# Patient Record
Sex: Female | Born: 1935 | Race: Black or African American | Hispanic: No | Smoking: Never smoker
Health system: Southern US, Community
[De-identification: ages and names within clinical notes are randomized; demographics above are authoritative.]

## PROBLEM LIST (undated history)

## (undated) DIAGNOSIS — H409 Unspecified glaucoma: Secondary | ICD-10-CM

## (undated) DIAGNOSIS — I10 Essential (primary) hypertension: Secondary | ICD-10-CM

## (undated) DIAGNOSIS — F0390 Unspecified dementia without behavioral disturbance: Secondary | ICD-10-CM

## (undated) DIAGNOSIS — K219 Gastro-esophageal reflux disease without esophagitis: Secondary | ICD-10-CM

## (undated) DIAGNOSIS — R441 Visual hallucinations: Secondary | ICD-10-CM

## (undated) HISTORY — PX: CATARACT EXTRACTION: SUR2

## (undated) HISTORY — PX: REPLACEMENT TOTAL KNEE BILATERAL: SUR1225

## (undated) HISTORY — PX: GLAUCOMA REPAIR: SHX214

---

## 1998-02-15 ENCOUNTER — Other Ambulatory Visit: Admission: RE | Admit: 1998-02-15 | Discharge: 1998-02-15 | Payer: Self-pay | Admitting: Gynecology

## 1999-01-02 ENCOUNTER — Ambulatory Visit (HOSPITAL_COMMUNITY): Admission: RE | Admit: 1999-01-02 | Discharge: 1999-01-02 | Payer: Self-pay | Admitting: Nephrology

## 1999-01-02 ENCOUNTER — Encounter: Payer: Self-pay | Admitting: Nephrology

## 1999-03-09 ENCOUNTER — Emergency Department (HOSPITAL_COMMUNITY): Admission: EM | Admit: 1999-03-09 | Discharge: 1999-03-09 | Payer: Self-pay | Admitting: Emergency Medicine

## 1999-03-09 ENCOUNTER — Encounter: Payer: Self-pay | Admitting: Emergency Medicine

## 2000-06-02 ENCOUNTER — Other Ambulatory Visit: Admission: RE | Admit: 2000-06-02 | Discharge: 2000-06-02 | Payer: Self-pay | Admitting: Family Medicine

## 2000-06-16 ENCOUNTER — Ambulatory Visit (HOSPITAL_COMMUNITY): Admission: RE | Admit: 2000-06-16 | Discharge: 2000-06-16 | Payer: Self-pay | Admitting: Family Medicine

## 2000-06-16 ENCOUNTER — Encounter: Payer: Self-pay | Admitting: Family Medicine

## 2001-06-02 ENCOUNTER — Ambulatory Visit (HOSPITAL_COMMUNITY): Admission: RE | Admit: 2001-06-02 | Discharge: 2001-06-02 | Payer: Self-pay | Admitting: Cardiology

## 2001-06-02 ENCOUNTER — Encounter: Payer: Self-pay | Admitting: Cardiology

## 2001-06-23 ENCOUNTER — Encounter: Payer: Self-pay | Admitting: Family Medicine

## 2001-06-23 ENCOUNTER — Ambulatory Visit (HOSPITAL_COMMUNITY): Admission: RE | Admit: 2001-06-23 | Discharge: 2001-06-23 | Payer: Self-pay | Admitting: Family Medicine

## 2001-08-03 ENCOUNTER — Other Ambulatory Visit: Admission: RE | Admit: 2001-08-03 | Discharge: 2001-08-03 | Payer: Self-pay | Admitting: Gynecology

## 2001-12-17 ENCOUNTER — Emergency Department (HOSPITAL_COMMUNITY): Admission: EM | Admit: 2001-12-17 | Discharge: 2001-12-17 | Payer: Self-pay | Admitting: Emergency Medicine

## 2001-12-21 ENCOUNTER — Encounter: Admission: RE | Admit: 2001-12-21 | Discharge: 2001-12-21 | Payer: Self-pay | Admitting: General Practice

## 2001-12-21 ENCOUNTER — Encounter: Payer: Self-pay | Admitting: General Practice

## 2002-08-18 ENCOUNTER — Ambulatory Visit (HOSPITAL_COMMUNITY): Admission: RE | Admit: 2002-08-18 | Discharge: 2002-08-18 | Payer: Self-pay | Admitting: Family Medicine

## 2002-08-18 ENCOUNTER — Encounter: Payer: Self-pay | Admitting: Family Medicine

## 2003-11-29 ENCOUNTER — Encounter: Admission: RE | Admit: 2003-11-29 | Discharge: 2003-11-29 | Payer: Self-pay | Admitting: Sports Medicine

## 2003-12-05 ENCOUNTER — Encounter: Admission: RE | Admit: 2003-12-05 | Discharge: 2003-12-05 | Payer: Self-pay | Admitting: Internal Medicine

## 2004-01-11 ENCOUNTER — Encounter: Admission: RE | Admit: 2004-01-11 | Discharge: 2004-01-11 | Payer: Self-pay | Admitting: Sports Medicine

## 2004-04-24 ENCOUNTER — Encounter: Admission: RE | Admit: 2004-04-24 | Discharge: 2004-04-24 | Payer: Self-pay | Admitting: Neurosurgery

## 2004-07-30 ENCOUNTER — Encounter: Admission: RE | Admit: 2004-07-30 | Discharge: 2004-08-22 | Payer: Self-pay | Admitting: Neurosurgery

## 2004-11-29 ENCOUNTER — Encounter: Admission: RE | Admit: 2004-11-29 | Discharge: 2004-11-29 | Payer: Self-pay | Admitting: Internal Medicine

## 2005-05-13 ENCOUNTER — Ambulatory Visit: Admission: RE | Admit: 2005-05-13 | Discharge: 2005-05-13 | Payer: Self-pay | Admitting: Orthopedic Surgery

## 2005-05-26 ENCOUNTER — Inpatient Hospital Stay (HOSPITAL_COMMUNITY): Admission: RE | Admit: 2005-05-26 | Discharge: 2005-06-02 | Payer: Self-pay | Admitting: Orthopedic Surgery

## 2005-05-27 ENCOUNTER — Ambulatory Visit: Payer: Self-pay | Admitting: Physical Medicine & Rehabilitation

## 2005-06-02 ENCOUNTER — Ambulatory Visit: Payer: Self-pay | Admitting: Family Medicine

## 2005-06-06 ENCOUNTER — Ambulatory Visit: Payer: Self-pay | Admitting: Family Medicine

## 2005-06-11 ENCOUNTER — Ambulatory Visit: Payer: Self-pay | Admitting: Family Medicine

## 2005-06-18 ENCOUNTER — Ambulatory Visit: Payer: Self-pay | Admitting: Family Medicine

## 2005-06-25 ENCOUNTER — Ambulatory Visit: Payer: Self-pay | Admitting: Family Medicine

## 2005-07-21 ENCOUNTER — Encounter: Admission: RE | Admit: 2005-07-21 | Discharge: 2005-10-16 | Payer: Self-pay | Admitting: Orthopedic Surgery

## 2005-12-22 ENCOUNTER — Encounter: Admission: RE | Admit: 2005-12-22 | Discharge: 2005-12-22 | Payer: Self-pay | Admitting: Internal Medicine

## 2006-09-16 ENCOUNTER — Inpatient Hospital Stay (HOSPITAL_COMMUNITY): Admission: RE | Admit: 2006-09-16 | Discharge: 2006-09-22 | Payer: Self-pay | Admitting: Orthopedic Surgery

## 2006-09-22 ENCOUNTER — Ambulatory Visit: Payer: Self-pay | Admitting: Family Medicine

## 2006-09-24 ENCOUNTER — Ambulatory Visit: Payer: Self-pay | Admitting: Internal Medicine

## 2006-11-18 ENCOUNTER — Encounter: Admission: RE | Admit: 2006-11-18 | Discharge: 2007-02-02 | Payer: Self-pay | Admitting: Orthopedic Surgery

## 2007-01-19 ENCOUNTER — Encounter: Admission: RE | Admit: 2007-01-19 | Discharge: 2007-01-19 | Payer: Self-pay | Admitting: General Practice

## 2007-03-18 ENCOUNTER — Emergency Department (HOSPITAL_COMMUNITY): Admission: EM | Admit: 2007-03-18 | Discharge: 2007-03-18 | Payer: Self-pay | Admitting: Emergency Medicine

## 2008-01-20 ENCOUNTER — Encounter: Admission: RE | Admit: 2008-01-20 | Discharge: 2008-01-20 | Payer: Self-pay | Admitting: Internal Medicine

## 2008-11-02 IMAGING — CR DG CHEST 2V
2 series · 2 of 2 positions shown · non-contrast
Comparison: 05/29/05.

CLINICAL DATA: Preoperative respiratory exam for knee replacement.
 CHEST - 2 VIEW:

[view not recorded (1 of 2)]
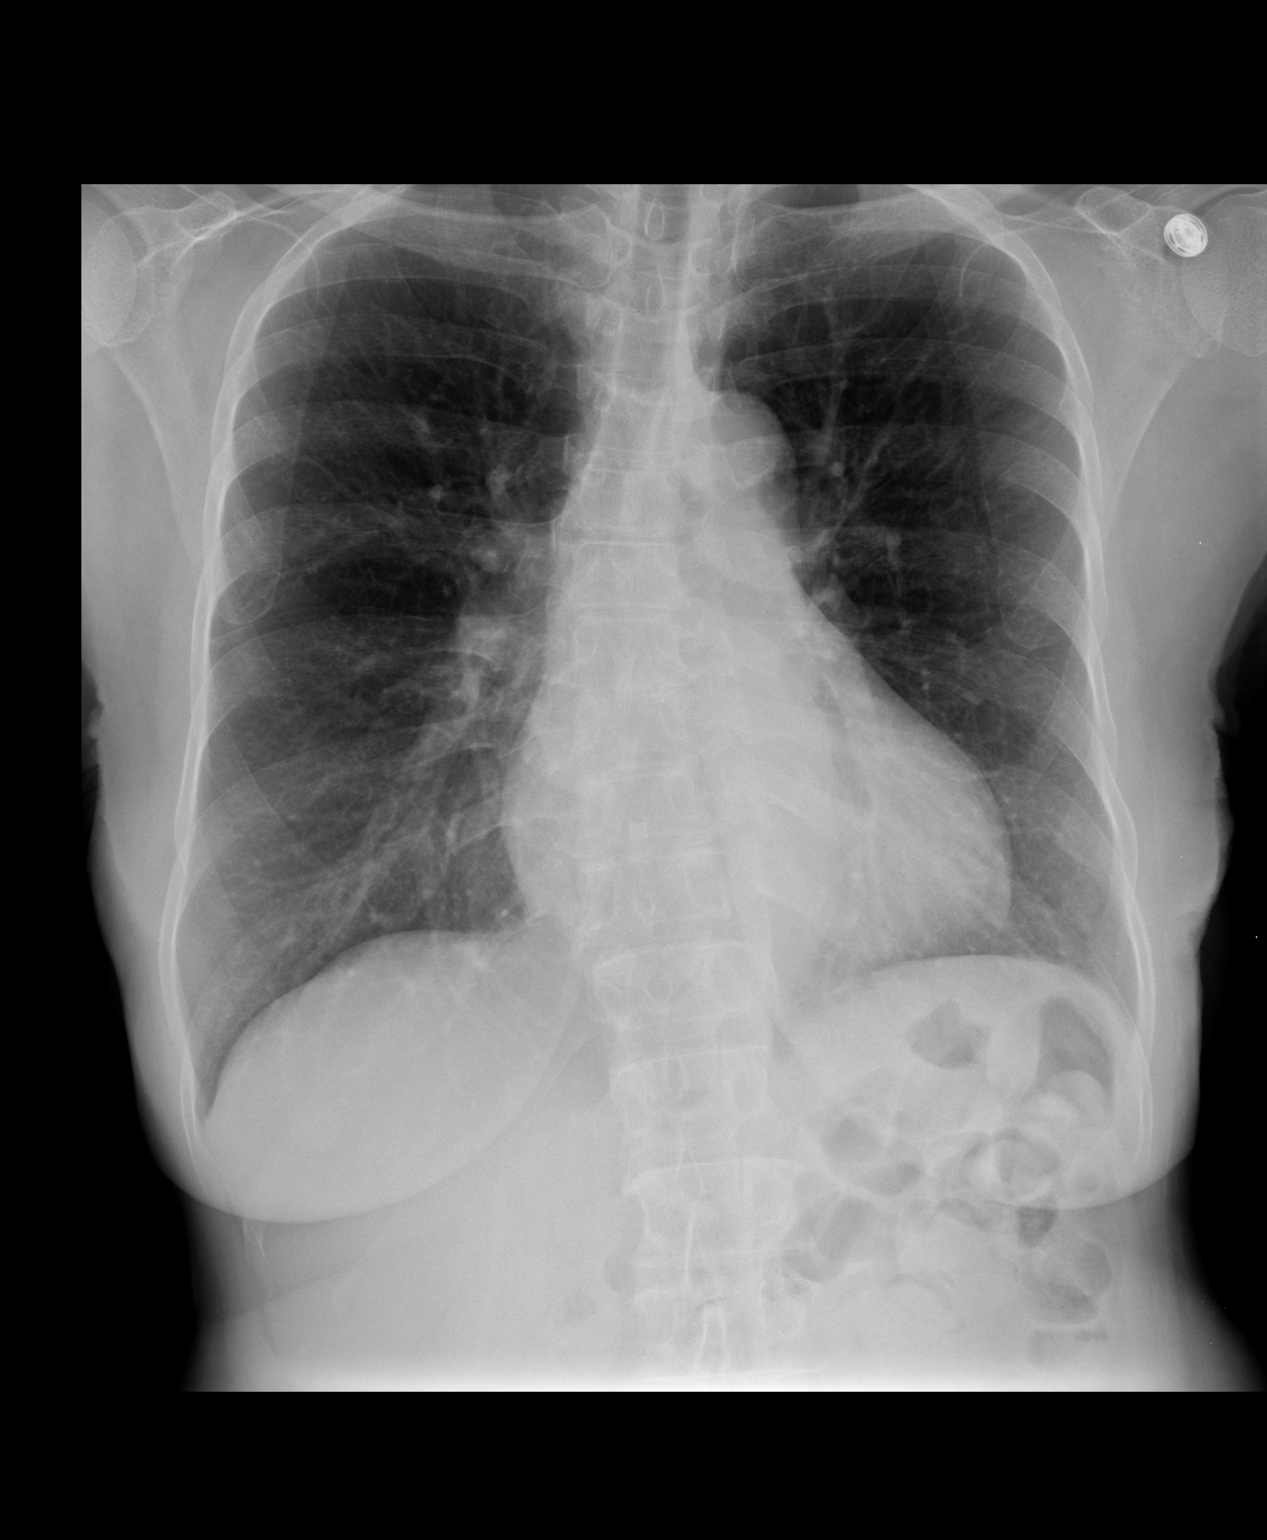

[view not recorded (2 of 2)]
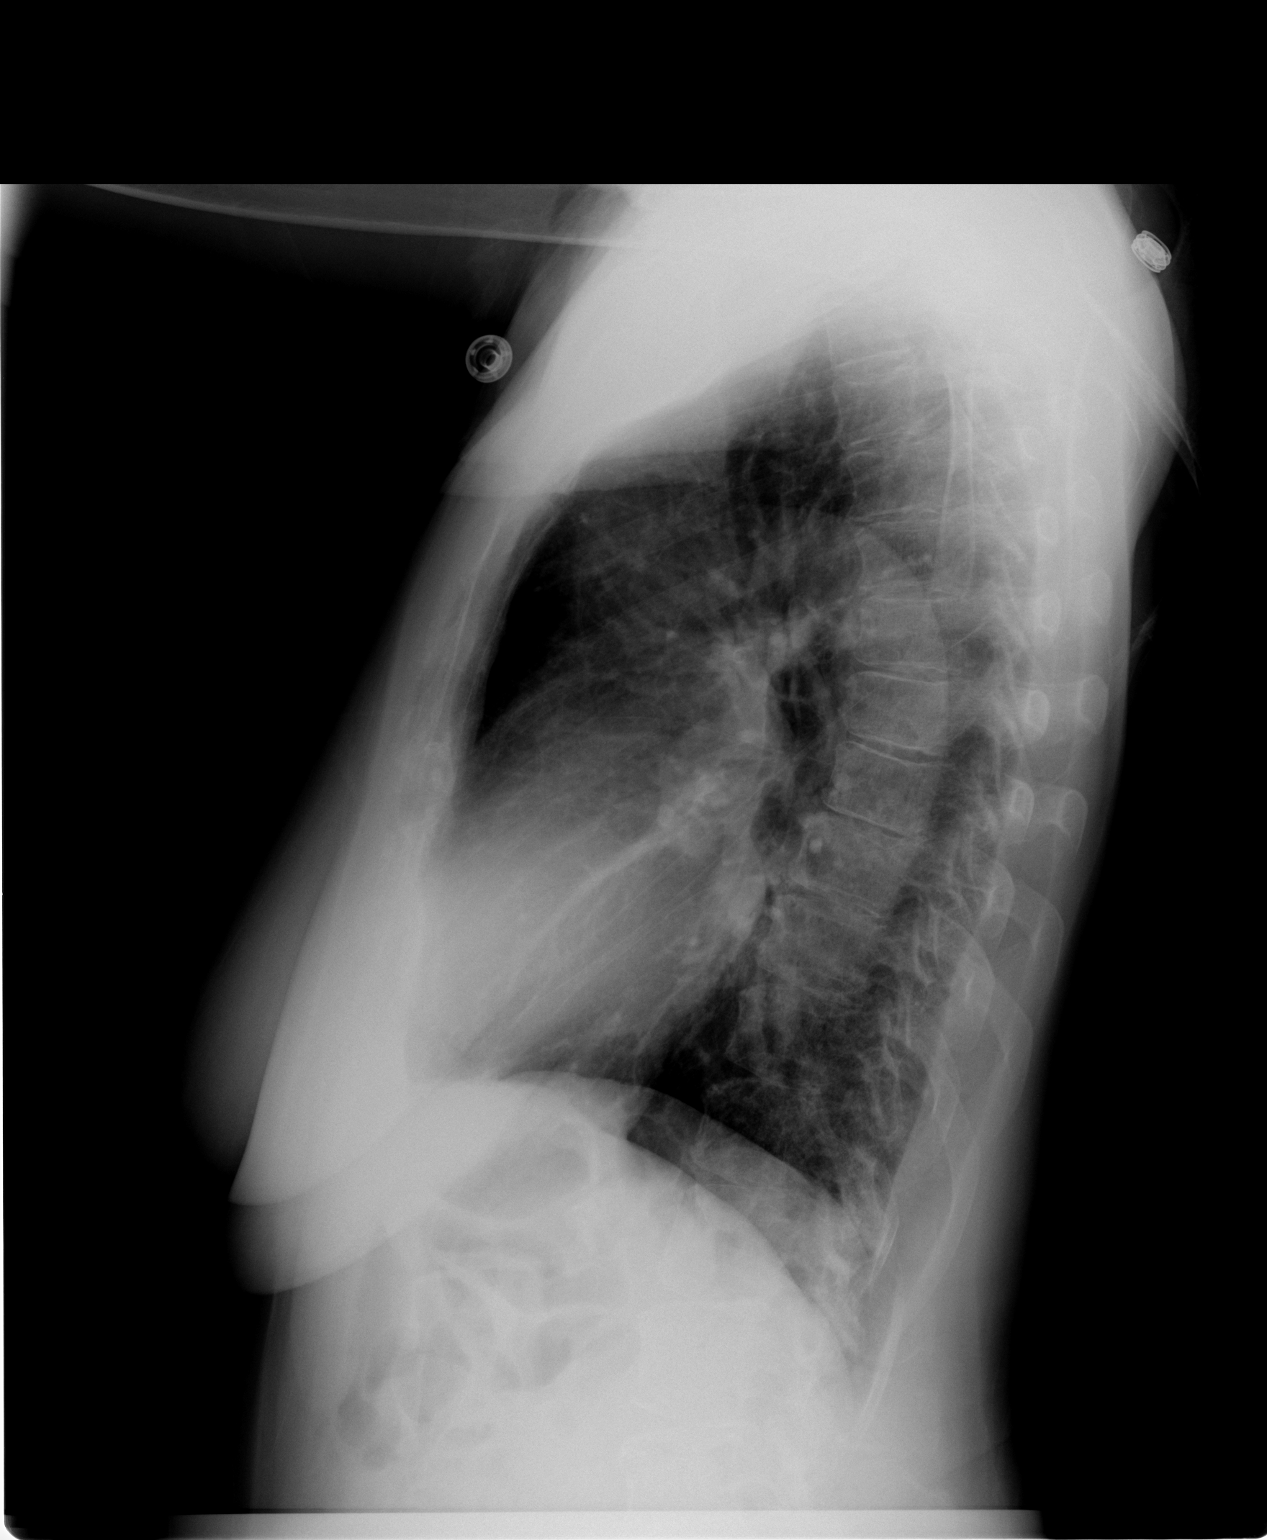

[2 of 2 positions shown; findings below may reference images not displayed]

FINDINGS: The heart is mildly enlarged. The mediastinum is unremarkable. There are a few pulmonary scars, but no evidence of active infiltrate, mass, effusion, or collapse. The spine shows scoliosis and mild degenerative change.
IMPRESSION: No change. No active disease.

## 2008-11-07 IMAGING — CR DG KNEE 1-2V*L*
2 series · 2 of 2 positions shown · non-contrast
Comparison: 01/02/99.

CLINICAL DATA: Status-post knee replacement for degenerative changes.
PORTABLE LEFT KNEE - 2 VIEW:

[view not recorded (1 of 2)]
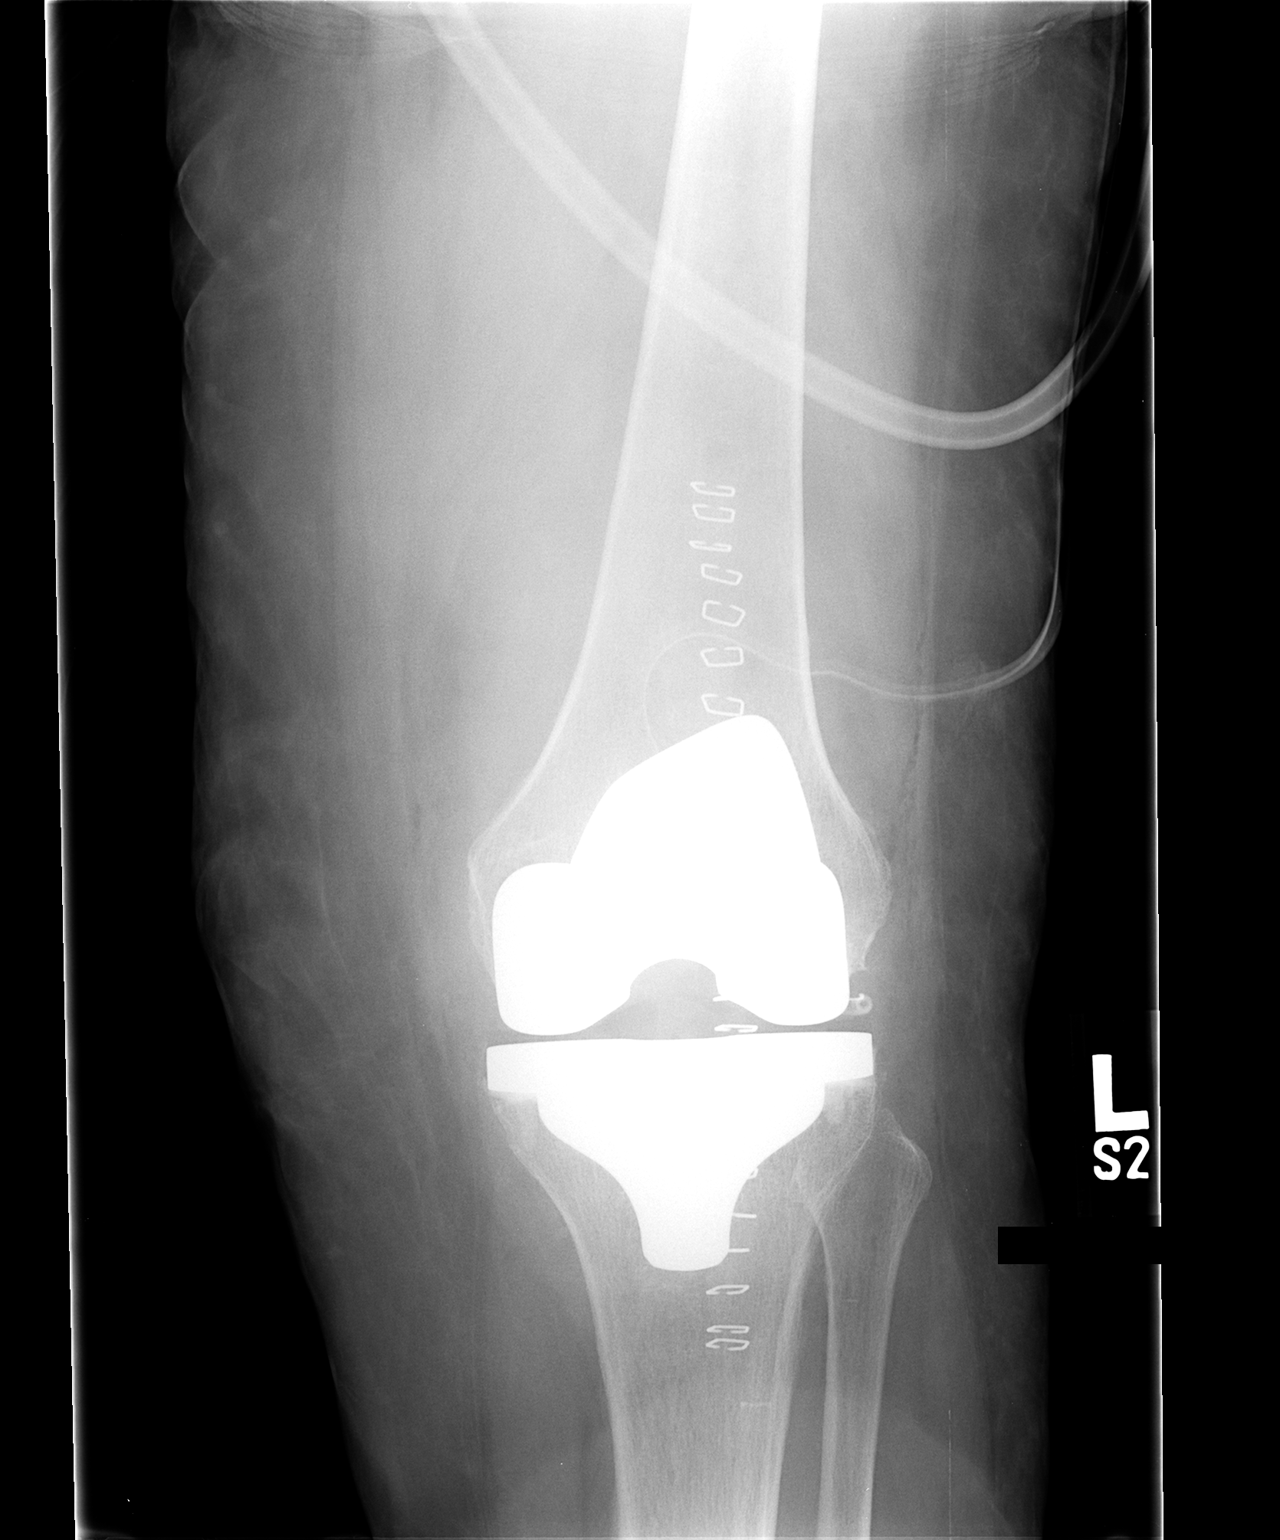

[view not recorded (2 of 2)]
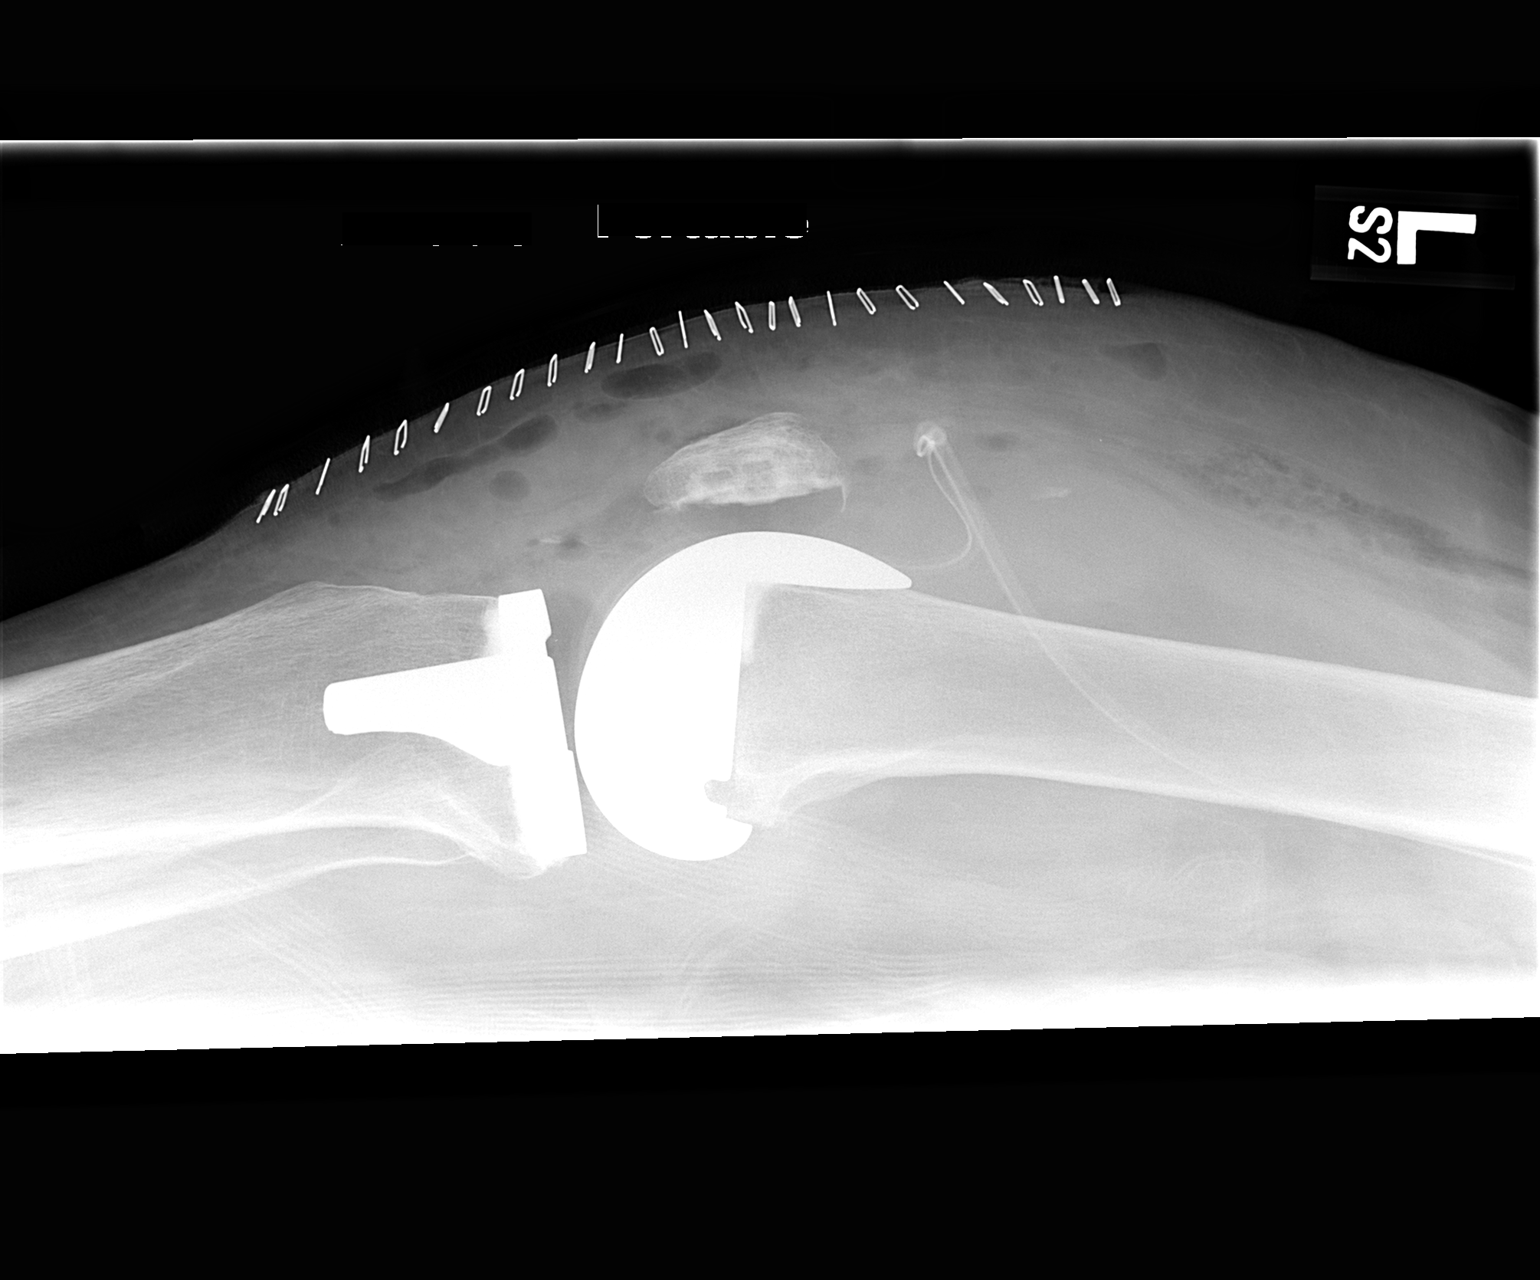

[2 of 2 positions shown; findings below may reference images not displayed]

FINDINGS: Total left knee replacement appears in satisfactory position.  There is a large suprapatellar joint effusion as well as a prominent amount of subcutaneous emphysema.  A surgical drain is in place.  all is into Dr. Rt.
IMPRESSION: 1.  Total left knee replacement appears in satisfactory position.
2.  Large suprapatellar joint effusion and prominent amount of subcutaneous emphysema.

## 2009-01-11 ENCOUNTER — Emergency Department (HOSPITAL_COMMUNITY): Admission: EM | Admit: 2009-01-11 | Discharge: 2009-01-11 | Payer: Self-pay | Admitting: Emergency Medicine

## 2009-03-01 ENCOUNTER — Ambulatory Visit (HOSPITAL_COMMUNITY): Admission: RE | Admit: 2009-03-01 | Discharge: 2009-03-01 | Payer: Self-pay | Admitting: Internal Medicine

## 2010-01-29 ENCOUNTER — Emergency Department (HOSPITAL_COMMUNITY)
Admission: EM | Admit: 2010-01-29 | Discharge: 2010-01-29 | Payer: Self-pay | Source: Home / Self Care | Admitting: Family Medicine

## 2010-01-30 ENCOUNTER — Encounter (INDEPENDENT_AMBULATORY_CARE_PROVIDER_SITE_OTHER): Payer: Self-pay | Admitting: Emergency Medicine

## 2010-01-30 ENCOUNTER — Ambulatory Visit (HOSPITAL_COMMUNITY)
Admission: RE | Admit: 2010-01-30 | Discharge: 2010-01-30 | Payer: Self-pay | Source: Home / Self Care | Attending: Emergency Medicine | Admitting: Emergency Medicine

## 2010-02-12 ENCOUNTER — Inpatient Hospital Stay (HOSPITAL_COMMUNITY)
Admission: EM | Admit: 2010-02-12 | Discharge: 2010-02-19 | Payer: Self-pay | Source: Home / Self Care | Attending: Pulmonary Disease | Admitting: Pulmonary Disease

## 2010-02-14 ENCOUNTER — Encounter (INDEPENDENT_AMBULATORY_CARE_PROVIDER_SITE_OTHER): Payer: Self-pay | Admitting: Pulmonary Disease

## 2010-02-17 ENCOUNTER — Encounter: Payer: Self-pay | Admitting: Internal Medicine

## 2010-02-17 LAB — CONVERTED CEMR LAB
BUN: 14 mg/dL
CO2: 29 meq/L
Chloride: 103 meq/L
Creatinine, Ser: 0.72 mg/dL
HCT: 33.9 %
Hemoglobin: 11.3 g/dL
MCV: 33.9 fL
Potassium: 3.6 meq/L
Sodium: 138 meq/L
WBC: 10.4 10*3/uL

## 2010-02-18 LAB — GLUCOSE, CAPILLARY
Glucose-Capillary: 103 mg/dL — ABNORMAL HIGH (ref 70–99)
Glucose-Capillary: 107 mg/dL — ABNORMAL HIGH (ref 70–99)
Glucose-Capillary: 118 mg/dL — ABNORMAL HIGH (ref 70–99)
Glucose-Capillary: 129 mg/dL — ABNORMAL HIGH (ref 70–99)
Glucose-Capillary: 134 mg/dL — ABNORMAL HIGH (ref 70–99)
Glucose-Capillary: 136 mg/dL — ABNORMAL HIGH (ref 70–99)
Glucose-Capillary: 136 mg/dL — ABNORMAL HIGH (ref 70–99)
Glucose-Capillary: 148 mg/dL — ABNORMAL HIGH (ref 70–99)
Glucose-Capillary: 163 mg/dL — ABNORMAL HIGH (ref 70–99)
Glucose-Capillary: 164 mg/dL — ABNORMAL HIGH (ref 70–99)
Glucose-Capillary: 175 mg/dL — ABNORMAL HIGH (ref 70–99)
Glucose-Capillary: 106 mg/dL — ABNORMAL HIGH (ref 70–99)
Glucose-Capillary: 109 mg/dL — ABNORMAL HIGH (ref 70–99)
Glucose-Capillary: 118 mg/dL — ABNORMAL HIGH (ref 70–99)
Glucose-Capillary: 140 mg/dL — ABNORMAL HIGH (ref 70–99)
Glucose-Capillary: 152 mg/dL — ABNORMAL HIGH (ref 70–99)
Glucose-Capillary: 154 mg/dL — ABNORMAL HIGH (ref 70–99)
Glucose-Capillary: 158 mg/dL — ABNORMAL HIGH (ref 70–99)
Glucose-Capillary: 165 mg/dL — ABNORMAL HIGH (ref 70–99)
Glucose-Capillary: 166 mg/dL — ABNORMAL HIGH (ref 70–99)
Glucose-Capillary: 171 mg/dL — ABNORMAL HIGH (ref 70–99)
Glucose-Capillary: 174 mg/dL — ABNORMAL HIGH (ref 70–99)
Glucose-Capillary: 179 mg/dL — ABNORMAL HIGH (ref 70–99)
Glucose-Capillary: 99 mg/dL (ref 70–99)
Glucose-Capillary: 115 mg/dL — ABNORMAL HIGH (ref 70–99)
Glucose-Capillary: 116 mg/dL — ABNORMAL HIGH (ref 70–99)
Glucose-Capillary: 120 mg/dL — ABNORMAL HIGH (ref 70–99)
Glucose-Capillary: 139 mg/dL — ABNORMAL HIGH (ref 70–99)
Glucose-Capillary: 87 mg/dL (ref 70–99)

## 2010-02-18 LAB — BASIC METABOLIC PANEL
BUN: 14 mg/dL (ref 6–23)
BUN: 9 mg/dL (ref 6–23)
BUN: 11 mg/dL (ref 6–23)
BUN: 11 mg/dL (ref 6–23)
BUN: 14 mg/dL (ref 6–23)
CO2: 24 mEq/L (ref 19–32)
CO2: 24 mEq/L (ref 19–32)
CO2: 26 mEq/L (ref 19–32)
CO2: 30 mEq/L (ref 19–32)
CO2: 29 mEq/L (ref 19–32)
Calcium: 9.1 mg/dL (ref 8.4–10.5)
Calcium: 8.6 mg/dL (ref 8.4–10.5)
Calcium: 8.8 mg/dL (ref 8.4–10.5)
Calcium: 9 mg/dL (ref 8.4–10.5)
Calcium: 8.6 mg/dL (ref 8.4–10.5)
Chloride: 95 mEq/L — ABNORMAL LOW (ref 96–112)
Chloride: 102 mEq/L (ref 96–112)
Chloride: 107 mEq/L (ref 96–112)
Chloride: 103 mEq/L (ref 96–112)
Chloride: 103 mEq/L (ref 96–112)
Creatinine, Ser: 0.72 mg/dL (ref 0.4–1.2)
Creatinine, Ser: 0.68 mg/dL (ref 0.4–1.2)
Creatinine, Ser: 0.6 mg/dL (ref 0.4–1.2)
Creatinine, Ser: 0.59 mg/dL (ref 0.4–1.2)
Creatinine, Ser: 0.72 mg/dL (ref 0.4–1.2)
GFR calc Af Amer: >60 mL/min (ref >60)
GFR calc Af Amer: >60 mL/min (ref >60)
GFR calc Af Amer: >60 mL/min (ref >60)
GFR calc Af Amer: >60 mL/min (ref >60)
GFR calc Af Amer: >60 mL/min (ref >60)
GFR calc non Af Amer: >60 mL/min (ref >60)
GFR calc non Af Amer: >60 mL/min (ref >60)
GFR calc non Af Amer: >60 mL/min (ref >60)
GFR calc non Af Amer: >60 mL/min (ref >60)
GFR calc non Af Amer: >60 mL/min (ref >60)
Glucose, Bld: 120 mg/dL — ABNORMAL HIGH (ref 70–99)
Glucose, Bld: 130 mg/dL — ABNORMAL HIGH (ref 70–99)
Glucose, Bld: 165 mg/dL — ABNORMAL HIGH (ref 70–99)
Glucose, Bld: 116 mg/dL — ABNORMAL HIGH (ref 70–99)
Glucose, Bld: 82 mg/dL (ref 70–99)
Potassium: 3.2 mEq/L — ABNORMAL LOW (ref 3.5–5.1)
Potassium: 4.1 mEq/L (ref 3.5–5.1)
Potassium: 4.4 mEq/L (ref 3.5–5.1)
Potassium: 4.1 mEq/L (ref 3.5–5.1)
Potassium: 3.6 mEq/L (ref 3.5–5.1)
Sodium: 133 mEq/L — ABNORMAL LOW (ref 135–145)
Sodium: 135 mEq/L (ref 135–145)
Sodium: 141 mEq/L (ref 135–145)
Sodium: 140 mEq/L (ref 135–145)
Sodium: 138 mEq/L (ref 135–145)

## 2010-02-18 LAB — DIFFERENTIAL
Basophils Absolute: 0 10*3/uL (ref 0.0–0.1)
Basophils Relative: 0 % (ref 0–1)
Eosinophils Absolute: 0.1 10*3/uL (ref 0.0–0.7)
Eosinophils Relative: 1 % (ref 0–5)
Lymphocytes Relative: 66 % — ABNORMAL HIGH (ref 12–46)
Lymphs Abs: 6.6 10*3/uL — ABNORMAL HIGH (ref 0.7–4.0)
Monocytes Absolute: 0.9 10*3/uL (ref 0.1–1.0)
Monocytes Relative: 9 % (ref 3–12)
Neutro Abs: 2.4 10*3/uL (ref 1.7–7.7)
Neutrophils Relative %: 24 % — ABNORMAL LOW (ref 43–77)

## 2010-02-18 LAB — BENZODIAZEPINE, QUANTITATIVE, URINE
Alprazolam (GC/LC/MS), ur confirm: NEGATIVE NG/ML
Flurazepam GC/MS Conf: NEGATIVE NG/ML
Lorazepam UR QT: NEGATIVE NG/ML
Nordiazepam GC/MS Conf: NEGATIVE NG/ML
Oxazepam GC/MS Conf: NEGATIVE NG/ML
Temazepam GC/MS Conf: NEGATIVE NG/ML

## 2010-02-18 LAB — DRUGS OF ABUSE SCREEN W/O ALC, ROUTINE URINE
Amphetamine Screen, Ur: NEGATIVE
Barbiturate Quant, Ur: NEGATIVE
Benzodiazepines.: POSITIVE — AB
Cocaine Metabolites: NEGATIVE
Creatinine,U: 30.9 mg/dL
Marijuana Metabolite: NEGATIVE
Methadone: NEGATIVE
Opiate Screen, Urine: NEGATIVE
Phencyclidine (PCP): NEGATIVE
Propoxyphene: NEGATIVE

## 2010-02-18 LAB — CBC
HCT: 33.2 % — ABNORMAL LOW (ref 36.0–46.0)
HCT: 34 % — ABNORMAL LOW (ref 36.0–46.0)
HCT: 34.6 % — ABNORMAL LOW (ref 36.0–46.0)
HCT: 34.7 % — ABNORMAL LOW (ref 36.0–46.0)
HCT: 33.9 % — ABNORMAL LOW (ref 36.0–46.0)
Hemoglobin: 11 g/dL — ABNORMAL LOW (ref 12.0–15.0)
Hemoglobin: 11.6 g/dL — ABNORMAL LOW (ref 12.0–15.0)
Hemoglobin: 11.6 g/dL — ABNORMAL LOW (ref 12.0–15.0)
Hemoglobin: 11.6 g/dL — ABNORMAL LOW (ref 12.0–15.0)
Hemoglobin: 11.3 g/dL — ABNORMAL LOW (ref 12.0–15.0)
MCH: 23.6 pg — ABNORMAL LOW (ref 26.0–34.0)
MCH: 24 pg — ABNORMAL LOW (ref 26.0–34.0)
MCH: 23.8 pg — ABNORMAL LOW (ref 26.0–34.0)
MCH: 24 pg — ABNORMAL LOW (ref 26.0–34.0)
MCH: 23.9 pg — ABNORMAL LOW (ref 26.0–34.0)
MCHC: 33.1 g/dL (ref 30.0–36.0)
MCHC: 34.1 g/dL (ref 30.0–36.0)
MCHC: 33.5 g/dL (ref 30.0–36.0)
MCHC: 33.4 g/dL (ref 30.0–36.0)
MCHC: 33.3 g/dL (ref 30.0–36.0)
MCV: 71.2 fL — ABNORMAL LOW (ref 78.0–100.0)
MCV: 70.4 fL — ABNORMAL LOW (ref 78.0–100.0)
MCV: 71 fL — ABNORMAL LOW (ref 78.0–100.0)
MCV: 71.7 fL — ABNORMAL LOW (ref 78.0–100.0)
MCV: 71.7 fL — ABNORMAL LOW (ref 78.0–100.0)
Platelets: 212 10*3/uL (ref 150–400)
Platelets: 217 10*3/uL (ref 150–400)
Platelets: 241 10*3/uL (ref 150–400)
Platelets: 233 10*3/uL (ref 150–400)
Platelets: 233 10*3/uL (ref 150–400)
RBC: 4.66 MIL/uL (ref 3.87–5.11)
RBC: 4.83 MIL/uL (ref 3.87–5.11)
RBC: 4.87 MIL/uL (ref 3.87–5.11)
RBC: 4.84 MIL/uL (ref 3.87–5.11)
RBC: 4.73 MIL/uL (ref 3.87–5.11)
RDW: 14.9 % (ref 11.5–15.5)
RDW: 15 % (ref 11.5–15.5)
RDW: 15.3 % (ref 11.5–15.5)
RDW: 15.4 % (ref 11.5–15.5)
RDW: 15.1 % (ref 11.5–15.5)
WBC: 10 10*3/uL (ref 4.0–10.5)
WBC: 11.2 10*3/uL — ABNORMAL HIGH (ref 4.0–10.5)
WBC: 12.2 10*3/uL — ABNORMAL HIGH (ref 4.0–10.5)
WBC: 9.3 10*3/uL (ref 4.0–10.5)
WBC: 10.4 10*3/uL (ref 4.0–10.5)

## 2010-02-18 LAB — POCT I-STAT 3, ART BLOOD GAS (G3+)
Acid-Base Excess: 4 mmol/L — ABNORMAL HIGH (ref 0.0–2.0)
Bicarbonate: 26.2 mEq/L — ABNORMAL HIGH (ref 20.0–24.0)
O2 Saturation: 100 %
TCO2: 27 mmol/L (ref 0–100)
pCO2 arterial: 30.7 mmHg — ABNORMAL LOW (ref 35.0–45.0)
pH, Arterial: 7.538 — ABNORMAL HIGH (ref 7.350–7.400)
pO2, Arterial: 144 mmHg — ABNORMAL HIGH (ref 80.0–100.0)

## 2010-02-18 LAB — POCT I-STAT, CHEM 8
BUN: 16 mg/dL (ref 6–23)
Calcium, Ion: 1.08 mmol/L — ABNORMAL LOW (ref 1.12–1.32)
Chloride: 100 mEq/L (ref 96–112)
Creatinine, Ser: 0.9 mg/dL (ref 0.4–1.2)
Glucose, Bld: 137 mg/dL — ABNORMAL HIGH (ref 70–99)
HCT: 38 % (ref 36.0–46.0)
Hemoglobin: 12.9 g/dL (ref 12.0–15.0)
Potassium: 3.3 mEq/L — ABNORMAL LOW (ref 3.5–5.1)
Sodium: 135 mEq/L (ref 135–145)
TCO2: 26 mmol/L (ref 0–100)

## 2010-02-18 LAB — BLOOD GAS, ARTERIAL
Acid-Base Excess: 0.6 mmol/L (ref 0.0–2.0)
Acid-Base Excess: 1.3 mmol/L (ref 0.0–2.0)
Bicarbonate: 25.1 mEq/L — ABNORMAL HIGH (ref 20.0–24.0)
Bicarbonate: 25.2 mEq/L — ABNORMAL HIGH (ref 20.0–24.0)
Drawn by: 27407
FIO2: 0.4 %
FIO2: 0.4 %
MECHVT: 450 mL
MECHVT: 450 mL
O2 Saturation: 99.2 %
O2 Saturation: 99.3 %
PEEP: 5 cmH2O
PEEP: 5 cmH2O
Patient temperature: 98.9
Patient temperature: 98.6
RATE: 10 resp/min
RATE: 10 resp/min
TCO2: 26.4 mmol/L (ref 0–100)
TCO2: 26.4 mmol/L (ref 0–100)
pCO2 arterial: 43.1 mmHg (ref 35.0–45.0)
pCO2 arterial: 38.9 mmHg (ref 35.0–45.0)
pH, Arterial: 7.384 (ref 7.350–7.400)
pH, Arterial: 7.427 — ABNORMAL HIGH (ref 7.350–7.400)
pO2, Arterial: 164 mmHg — ABNORMAL HIGH (ref 80.0–100.0)
pO2, Arterial: 157 mmHg — ABNORMAL HIGH (ref 80.0–100.0)

## 2010-02-18 LAB — CARDIAC PANEL(CRET KIN+CKTOT+MB+TROPI)
CK, MB: 1.2 ng/mL (ref 0.3–4.0)
CK, MB: 1 ng/mL (ref 0.3–4.0)
Relative Index: INVALID (ref 0.0–2.5)
Relative Index: INVALID (ref 0.0–2.5)
Total CK: 97 U/L (ref 7–177)
Total CK: 79 U/L (ref 7–177)
Troponin I: 0.02 ng/mL (ref 0.00–0.06)
Troponin I: 0.01 ng/mL (ref 0.00–0.06)

## 2010-02-18 LAB — D-DIMER, QUANTITATIVE (NOT AT ARMC): D-Dimer, Quant: 3.09 ug/mL-FEU — ABNORMAL HIGH (ref 0.00–0.48)

## 2010-02-18 LAB — APTT: aPTT: 27 seconds (ref 24–37)

## 2010-02-18 LAB — MRSA PCR SCREENING: MRSA by PCR: NEGATIVE

## 2010-02-18 LAB — PHOSPHORUS
Phosphorus: 2.2 mg/dL — ABNORMAL LOW (ref 2.3–4.6)
Phosphorus: 3.1 mg/dL (ref 2.3–4.6)
Phosphorus: 1.8 mg/dL — ABNORMAL LOW (ref 2.3–4.6)
Phosphorus: 2.8 mg/dL (ref 2.3–4.6)

## 2010-02-18 LAB — CK TOTAL AND CKMB (NOT AT ARMC)
CK, MB: 1.4 ng/mL (ref 0.3–4.0)
Relative Index: 1.2 (ref 0.0–2.5)
Total CK: 120 U/L (ref 7–177)

## 2010-02-18 LAB — TROPONIN I: Troponin I: 0.02 ng/mL (ref 0.00–0.06)

## 2010-02-18 LAB — MAGNESIUM
Magnesium: 2.3 mg/dL (ref 1.5–2.5)
Magnesium: 2.5 mg/dL (ref 1.5–2.5)
Magnesium: 2.7 mg/dL — ABNORMAL HIGH (ref 1.5–2.5)
Magnesium: 2.6 mg/dL — ABNORMAL HIGH (ref 1.5–2.5)

## 2010-02-18 LAB — PATHOLOGIST SMEAR REVIEW

## 2010-02-18 LAB — PROTIME-INR
INR: 0.89 (ref 0.00–1.49)
Prothrombin Time: 12.2 seconds (ref 11.6–15.2)

## 2010-02-18 LAB — BRAIN NATRIURETIC PEPTIDE: Pro B Natriuretic peptide (BNP): 81 pg/mL (ref 0.0–100.0)

## 2010-02-20 LAB — GLUCOSE, CAPILLARY
Glucose-Capillary: 89 mg/dL (ref 70–99)
Glucose-Capillary: 112 mg/dL — ABNORMAL HIGH (ref 70–99)
Glucose-Capillary: 174 mg/dL — ABNORMAL HIGH (ref 70–99)
Glucose-Capillary: 104 mg/dL — ABNORMAL HIGH (ref 70–99)
Glucose-Capillary: 83 mg/dL (ref 70–99)
Glucose-Capillary: 134 mg/dL — ABNORMAL HIGH (ref 70–99)

## 2010-02-20 LAB — CARDIAC PANEL(CRET KIN+CKTOT+MB+TROPI)
CK, MB: 0.5 ng/mL (ref 0.3–4.0)
CK, MB: 0.5 ng/mL (ref 0.3–4.0)
CK, MB: 1 ng/mL (ref 0.3–4.0)
Relative Index: INVALID (ref 0.0–2.5)
Relative Index: INVALID (ref 0.0–2.5)
Relative Index: INVALID (ref 0.0–2.5)
Total CK: 32 U/L (ref 7–177)
Total CK: 27 U/L (ref 7–177)
Total CK: 31 U/L (ref 7–177)
Troponin I: 0.01 ng/mL (ref 0.00–0.06)
Troponin I: <0.01 ng/mL (ref 0.00–0.06)
Troponin I: 0.02 ng/mL (ref 0.00–0.06)

## 2010-02-24 ENCOUNTER — Encounter: Payer: Self-pay | Admitting: Neurosurgery

## 2010-02-24 ENCOUNTER — Encounter: Payer: Self-pay | Admitting: Internal Medicine

## 2010-03-05 NOTE — Discharge Summary (Signed)
NAME:  Theresa Bauer, Theresa Bauer                   ACCOUNT NO.:  [id number]  MEDICAL RECORD NO.:  [id number]          PATIENT TYPE:  INP  LOCATION:  5523                         FACILITY:  [facility]  PHYSICIAN:  Leslye Peer, MD    DATE OF BIRTH:  06-13-1929  DATE OF ADMISSION:  02/12/2010 DATE OF DISCHARGE:  02/19/2010                              DISCHARGE SUMMARY   DISCHARGE DIAGNOSES: 1. Acute respiratory failure due to upper airway obstruction in the     setting of angioedema.  This is believed to be secondary to ACE     inhibitor versus new medication by the name of Vimovo, which was     just recently started (this is now resolved). 2. Musculoskeletal chest pain. 3. Hypertension. 4. Dysphagia secondary to acute respiratory failure (resolved). 5. Chronic venous stasis disease with associated pain. 6. Debilitation following hospitalization.  CONSULTANTS:  None.  PROCEDURES:  Endotracheal tube.  This was done by Anesthesia under glide scope.  She was intubated on February 13, 2010, and extubated on February 15, 2010.  LABORATORY DATA:  Cardiac enzymes all negative.  These were done in serial fashion on February 18, 2010, through the February 16, 2010.  Dated February 17, 2010; sodium 138, potassium 3.6, chloride 103, CO2 of 29, glucose 82, BUN 14, and creatinine 0.72.  White blood cell count 10.4, hemoglobin 11.3, hematocrit 33.9, and platelet count 233.  Phosphorus 8.2.  IMAGING DATA:  Last chest x-ray obtained on February 16, 2010, demonstrates improved aeration with some residual atelectasis.  BRIEF HISTORY:  This is a pleasant 77 year old African American female with a known history of hypertension, gastroesophageal reflux disease, L4 through L5 spinal stenosis, and chronic venous stasis disease.  She presents to emergency room with tongue swelling and dyspnea on February 13, 2009, after recently starting a new medication by the name of Vimovo.  Of note, she also was on ACE inhibitor.   She was treated with epinephrine, Benadryl, and Solu-Medrol as well as Zantac.  In the emergency room, she had no improvement in her wheezing nor dyspnea. Therefore, she was intubated given concern for airway protection.  DISCHARGE DIAGNOSES: 1. Upper airway obstruction secondary to angioedema with acute     respiratory failure.  Theresa Bauer was intubated as previously     mentioned by Anesthesia after failure to respond to rescue therapy     in the emergency room.  Therapeutic interventions included     discontinuation of ACE inhibitor, discontinuation of the Vimovo,     supportive care with mechanical ventilation, scheduled Solu-Medrol,     scheduled H2 blockade, and close monitoring.  She continued to     improve over the course of her hospitalization.  Airway cuff leak     was checked and she was successfully extubated on February 15, 2010.     Upon time of dictation, Theresa Bauer no longer has any evidence of     upper airway obstruction.  Her breath sounds are clear.  There is     no upper airway wheeze.  Her dysphagia is resolved.  She has no  chest discomfort.  She will be discharged to home with instructions     to not resume Vimovo, also she has been instructed to discontinue     ACE inhibitors.  She will complete a 5-day taper of prednisone and     continue H2 blockade in the form of Zantac.  This will also serve     to assist with her underlying gastroesophageal reflux disease. 2. Noncardiac related chest pain.  This is secondary to cough.  All     cardiac enzymes and EKG have been negative.  Probably to some     extent, exacerbated by a gastroesophageal reflux disease.  Again,     this will be treated with Zantac as noted above. 3. Hypertension.  This was a underlying problem.  For this, she will     be treated with calcium channel blockade, Lopressor has been     ordered, and finally we will add low dose hydrochlorothiazide. 4. Chronic venous stasis disease with sclerosed  varicose veins in the     right.  These have been causing, Theresa Bauer to have some degree of     discomfort.  This has been a chronic issue and is exacerbated over     the course of the day with increased swelling.  Plan for this will     be to resume compression stockings this is a therapy she used in     the past, as well as to continue a low-dose diuretic regimen. 5. Dysphagia.  This is improved following airway upper airway     obstruction and angioedema.  Plan for this is to resume regular     diet. 6. Debilitation following critical illness.  For this, she will be     discharged to a skilled nursing facility, she will undergo physical     therapy and occupational therapy there with ultimate overall goal     to return to home.  DISCHARGE MEDICATIONS:  List is as follows; 1. Prednisone 10 mg tab, 15 mg daily tabs x1 day, 40 mg daily x1 day,     30 mg daily x1 day, 20 mg daily x1 day, 10 mg daily x1 day, and     then discontinue. 2. Omega-3 fish oil 1000 mg daily. 3. Amlodipine 5 mg daily. 4. Zantac 150 mg daily. 5. Multivitamin daily. 6. Lopressor HCT 50/25 half tab p.o. b.i.d. 7. Instructed to discontinue the Vimovo, discontinue the lisinopril.  DISPOSITION:  Theresa Bauer will be discharged to skilled nursing facility today on February 19, 2010.  She is asked that we help assist her with following up with new primary care, we have done so.  She will follow up with Dr. Rene Paci on April 18, 2010, at 9:15 a.m., this will be a new consultation for primary care.  SPECIAL INSTRUCTIONS:  Theresa Bauer will need followup blood chemistry within 5 days of discharge after reinstitution of diuretic therapy. Additionally, again Theresa Bauer will need assistance in set up to attend her first primary care meeting with Dr. Felicity Coyer on April 18, 2010, at 9:15 should she remain in the skilled nursing facility at that time.  DISPOSITION:  Theresa Bauer has met maximum benefit from inpatient care.  She is  now medically cleared for discharge to skilled nursing facility.     Zenia Resides, NP   ______________________________ Leslye Peer, MD    PB/MEDQ  D:  02/19/2010  T:  02/19/2010  Job:  [id number]  cc:   Vikki Ports  Edsel Petrin, MD  Electronically Signed by Zenia Resides NP on 03/01/2010 02:21:33 PM Electronically Signed by Levy Pupa MD on 03/05/2010 04:56:31 PM

## 2010-04-05 ENCOUNTER — Encounter: Payer: Self-pay | Admitting: Internal Medicine

## 2010-04-05 DIAGNOSIS — R079 Chest pain, unspecified: Secondary | ICD-10-CM | POA: Insufficient documentation

## 2010-04-05 DIAGNOSIS — I872 Venous insufficiency (chronic) (peripheral): Secondary | ICD-10-CM | POA: Insufficient documentation

## 2010-04-05 DIAGNOSIS — K219 Gastro-esophageal reflux disease without esophagitis: Secondary | ICD-10-CM | POA: Insufficient documentation

## 2010-04-05 DIAGNOSIS — I10 Essential (primary) hypertension: Secondary | ICD-10-CM | POA: Insufficient documentation

## 2010-04-08 ENCOUNTER — Ambulatory Visit: Payer: Self-pay | Admitting: Internal Medicine

## 2010-04-08 DIAGNOSIS — Z0289 Encounter for other administrative examinations: Secondary | ICD-10-CM

## 2010-04-15 LAB — POCT I-STAT, CHEM 8
Calcium, Ion: 1.23 mmol/L (ref 1.12–1.32)
Glucose, Bld: 89 mg/dL (ref 70–99)
HCT: 40 % (ref 36.0–46.0)
Hemoglobin: 13.6 g/dL (ref 12.0–15.0)
TCO2: 33 mmol/L (ref 0–100)

## 2010-04-19 ENCOUNTER — Other Ambulatory Visit (HOSPITAL_COMMUNITY): Payer: Self-pay | Admitting: Internal Medicine

## 2010-04-19 DIAGNOSIS — Z1231 Encounter for screening mammogram for malignant neoplasm of breast: Secondary | ICD-10-CM

## 2010-04-30 ENCOUNTER — Ambulatory Visit (HOSPITAL_COMMUNITY)
Admission: RE | Admit: 2010-04-30 | Discharge: 2010-04-30 | Disposition: A | Discharge status: Home / Self Care | Payer: Medicaid Other | Source: Ambulatory Visit | Attending: Internal Medicine | Admitting: Internal Medicine

## 2010-04-30 DIAGNOSIS — Z1231 Encounter for screening mammogram for malignant neoplasm of breast: Secondary | ICD-10-CM | POA: Insufficient documentation

## 2010-06-18 NOTE — Discharge Summary (Signed)
NAME:  Theresa Bauer, Theresa Bauer                   ACCOUNT NO.:  [id number]   MEDICAL RECORD NO.:  [id number]          PATIENT TYPE:  INP   LOCATION:  5002                         FACILITY:  [facility]   PHYSICIAN:  Loreta Ave, M.D. DATE OF BIRTH:  21-Jan-1930   DATE OF ADMISSION:  09/16/2006  DATE OF DISCHARGE:  09/22/2006                               DISCHARGE SUMMARY   FINAL DIAGNOSES:  1. Status post left total knee replacement for end-stage degenerative      joint disease.  2. Postoperative blood loss anemia, requiring transfusion of 2 units      of packed red blood cells.  3. Hypertension.  4. Osteoporosis.  5. L4-5 spinal stenosis.  6. Allergic rhinitis.  7. Status post right total knee replacement, April 2007.   HISTORY OF PRESENT ILLNESS:  This 77 year old black female presented to  our office with complaints of left knee pain.  Known history of end-  stage DJD.  Pain progressively worsening, with failure to respond to  conservative treatment.  Significant decrease in her daily activities  due to the ongoing complaint.   HOSPITAL COURSE:  On September 16, 2006, the patient was taken to the [facility] and a left total knee replacement procedure performed.  Surgeon:  Loreta Ave, M.D.  Assistant:  Genene Churn. Barry Dienes, P.A.-C.  Anesthesia:  General.  No specimens.  EBL:  Minimal.  Tourniquet time:  1 hour 24 minutes.  One Hemovac drain placed.  The patient tolerated the  procedure well and was transferred to recovery in stable condition.  On  September 17, 2006, the patient complained of left knee pain.  No  complaints of chest pain or shortness of breath.  Temperature 100.7,  pulse 90, respirations 18, blood pressure 129/67.  Hemoglobin 8.7,  hematocrit 26.7.  INR 1.1.  Dressing clean, dry, and intact.  Calf  nontender.  Neurovascularly intact.  Started FeSO4 325 mg p.o. b.i.d.  with meals.  Monitored hemoglobin.  Arranged skilled nursing facility  placement.  Encouraged incentive  spirometry.  Pharmacy protocol Coumadin  started.  On September 18, 2006 at 1:40, the patient had been tachycardic.  EKG was done.  Dr. Thurston Hole notified.  She did complain of some left upper  chest discomfort.  This resolved on its own.  EKG normal.  At 8:20,  patient seen and doing well.  Pain controlled.  No complaints of chest  pain, shortness of breath, or fatigue.  Vital signs stable, afebrile.  Hemoglobin 8.9, hematocrit 26.8.  Glucose 147.  INR 1.3.  Wound looked  good, staples intact.  No drainage or signs of infection.  Hemovac drain  discontinued.  Calf nontender, neurovascularly intact.  Discontinued  Foley and PCA.  Saline-locked IV.  Awaiting skilled nursing facility  placement.  On September 19, 2006, pain controlled.  No complaints of chest  pain or shortness of breath.  Some abdominal discomfort.  No bowel  movement yet.  Positive flatus.  Temperature 101.6, pulse 98,  respirations 20, blood pressure 143/71.  Hemoglobin 8.6, hematocrit  27.1.  Glucose 106.  INR 1.2.  Wound looked good, staples intact.  No  drainage or signs of infection.  Calf nontender, neurovascularly intact.  Abdomen soft, with mild tenderness with deep palpation.  No palpable  masses or organomegaly.  Ordered chest x-ray.  Again encouraged  incentive spirometry.  Dulcolax suppository.  On September 20, 2006,  patient doing well, with good pain control.  No specific complaints.  Had a good bowel movement.  Temperature 100.5, pulse 105, respirations  18, and blood pressure 136/70.  INR 1.2.  Chest x-ray showed mild  bibasilar atelectasis.  No infiltrates or effusion.  Wound looks good,  staples intact.  No drainage or signs of infection.  Calf nontender,  neurovascularly intact.  On September 21, 2006 at 2:30 a.m., the patient  again complained of left-sided chest pain under breast area.  This  increased with movement.  EKG again performed, showing normal sinus  rhythm.  Rapid response team came to assess patient.   Again, Dr. Thurston Hole  notified.  She was given Maalox 30 mL p.o., and this did help.  At 7:10  a.m., the patient states that she is doing much better after Maalox.  She says that she does have a history of GERD.  Feels some weakness,  being fatigued.  Temperature 97.7, pulse 79, respirations 18, blood  pressure 139/77.  Hemoglobin 7.9, hematocrit 24.3.  INR 1.4.  Electrolytes stable.  Wound looks good, staples intact.  No drainage or  signs of infection.  Calf nontender, neurovascularly intact.  Transfused  2 units of packed red blood cells, slow.  Started Protonix 40 mg p.o.  daily.  On September 22, 2006 at 4 a.m., the patient again complained of  left-sided chest pain under left breast area.  EKG again showed normal  sinus rhythm.  At 08:30, left-sided chest discomfort resolved.  No  complaints of chest pain or shortness of breath currently.  Pain control  of left knee.  No complaints of fatigue, and feels much better after  transfusion.  She has a bed offer at [facility] and states that she is  ready for transfer.  Temperature 98.6, pulse 77, respirations 16, blood  pressure 149/75.  WBC 6.7, hematocrit 33.5, hemoglobin 11, platelets  229.  Sodium 137, potassium 4, chloride 98, CO2 30, BUN 7, creatinine  0.73, glucose 98.  INR 1.5.  Wound looks good, staples intact.  No  drainage or signs of infection.  Calf nontender and neurovascularly  intact.   CONDITION:  Good and stable.   DISPOSITION:  Transferred to [facility] skilled facility today.   MEDICATIONS:  1. Coumadin pharmacy protocol.  2. Heparin 5000 units subcutaneous injections q.12 h. until Coumadin      therapeutic with INR 2-3.  3. Mepergan Forte 1 tab p.o. q.6 h. p.r.n. for pain.  4. Colace 100 mg p.o. b.i.d.  5. Lisinopril 20 mg p.o. daily.  6. Claritin 10 mg p.o. daily.  7. Travatan eye drops at bedtime.  8. FeSO4 325 mg p.o. b.i.d. with food x2 weeks.  9. Protonix 40 mg p.o. daily.  10.Tylenol 325-650 mg p.o. q.4-6 h.  p.r.n. for temperature greater      than 101.5.  11.Robaxin 500 mg 1 tab p.o. q.6 h. p.r.n. for spasms.   INSTRUCTIONS:  The patient will continue to work with PT and OT to  improve left knee range of motion, strengthening, and ambulation.  She  will follow up with Dr. Eulah Pont 2 weeks postop for recheck and staple  removal.  She will remain on Coumadin x4 weeks postop for DVT  prophylaxis.  Daily dressing changes with 4x4 gauze and tape.  She is  okay to shower, but no tub soaking.  If there are any questions or  concerns, please notify our office at [phone].  It is felt that the  patient's left-sided chest discomfort that she is having is likely  secondary to musculoskeletal pain.  She has had three EKGs postop that  have been negative, without acute changes.  If this continues to be an  issue during her stay at [facility], please notify the medical doctor for  evaluation.      Genene Churn. Denton Meek.      Loreta Ave, M.D.  Electronically Signed    JMO/MEDQ  D:  09/22/2006  T:  09/22/2006  Job:  [id number]

## 2010-06-18 NOTE — Op Note (Signed)
NAME:  Theresa Bauer, Theresa Bauer                   ACCOUNT NO.:  [id number]   MEDICAL RECORD NO.:  [id number]          PATIENT TYPE:  INP   LOCATION:  5002                         FACILITY:  [facility]   PHYSICIAN:  Loreta Ave, M.D. DATE OF BIRTH:  October 01, 1929   DATE OF PROCEDURE:  09/16/2006  DATE OF DISCHARGE:                               OPERATIVE REPORT   PREOPERATIVE DIAGNOSIS:  End stage degenerative arthritis, left knee,  with valgus alignment and bony deficiencies lateral femoral condyle.   POSTOPERATIVE DIAGNOSIS:  End stage degenerative arthritis, left knee,  with valgus alignment and bony deficiencies lateral femoral condyle.   PROCEDURE:  Left total knee replacement, [employer]ker Triathlon prosthesis,  minimally invasive system.  Soft tissue balancing.  Cemented peg  posterior stabilized #3 femoral component.  Cemented #4 tibial component  with 9 mm posterior stabilized polyethylene insert.  Resurfacing 29 x 9  mm peg cemented medial offset patellar component.   SURGEON:  Loreta Ave, M.D.   ASSISTANT:  Genene Churn. Barry Dienes, P.A.-C.   ANESTHESIA:  General.   BLOOD LOSS:  Minimal.   TOURNIQUET TIME:  1 hour 20 minutes.   SPECIMENS:  None.   COMPLICATIONS:  None.   DRESSING:  Soft compressive, knee immobilizer.   DRAINS:  Hemovac x1.   DESCRIPTION OF PROCEDURE:  The patient was brought to the operating room  and after adequate anesthesia was obtained, the left knee examined.  Full extension, 120 degrees of flexion.  Increased laxity because of  lateral bone loss with valgus alignment, but correctable reasonably.  Tourniquet applied.  Prepped and draped in the usual sterile fashion.  Exsanguinated with elevation of Esmarch and tourniquet inflated to 350  mmHg.  Straight incision above the patella down to tibial tubercle.  Medial arthrotomy vastus splitting minimally invasive system, preserving  the quad tendon.  Knee exposed.  Grade 4 changes throughout.  Most of  the deficiency  and marked changes laterally where there was marked  erosion not only of the cartilage but of the lateral femoral condyle.  Remnants of menisci and cruciate ligaments excised.  Relatively  osteopenic throughout.  Distal femur exposed.  Intramedullary guide  placed.  Distal cut 10 mm with most of bone loss on the lateral side.  A  10 mm resection.  Epicondylar axis marked.  Sized, cut and fitted for a  #3 posterior stabilized femoral component.  Tibia exposed.  Extramedullary guide.  3 degrees posterior slope cut.  Sufficient  resection for 9 mm insert.  Trials put in place throughout.  A #4 gave  good coverage of the tibia.  With a 9 mm insert, soft tissue balancing,  and appropriate cuts, I had full extension, full flexion, good stability  in flexion and extension, and a very even flexion as well as extension  gap when measured.  The patella was measured, cut, sized and fitted with  a 29 by 9 mm patellar component.  Excellent tracking at completion.  Tibia was marked for appropriate rotation utilizing trials and hand  reamed.  All trials were removed.  All recess examined,  all debris and  spurs removed.  Cement prepared, placed on all components, which were  firmly seated.  Polyethylene attached to the tibia.  Knee reduced.  Once  again re-examined after the cement had hardened.  Full extension, full  flexion, nice alignment, good balancing and good patellofemoral  tracking.  Hemovac placed and brought out through a separate stab wound.  Arthrotomy closed with #1 Vicryl, skin and subcutaneous tissue with 2-0  Vicryl and staples.  Knee injected with Marcaine.  Hemovac clamped.  Sterile compressive dressing applied.  Tourniquet deflated and removed.  Knee immobilizer applied.  Anesthesia reversed.  Brought to the recovery  room.  Tolerated surgery well.  No complications.      Loreta Ave, M.D.  Electronically Signed     DFM/MEDQ  D:  09/16/2006  T:  09/17/2006  Job:  [id number]

## 2010-06-21 NOTE — H&P (Signed)
NAME:  Theresa Bauer, Theresa Bauer NO.:  [id number]   MEDICAL RECORD NO.:  [id number]          PATIENT TYPE:  REC   LOCATION:  [facility]                         FACILITY:  [facility]   PHYSICIAN:  Loreta Ave, M.D. DATE OF BIRTH:  [birth year]   DATE OF ADMISSION:  07/21/2005  DATE OF DISCHARGE:                                HISTORY & PHYSICAL   CHIEF COMPLAINT:  Right knee pain.   HISTORY OF PRESENT ILLNESS:  A 77 year old black female with end-stage DJD,  right knee, and chronic pain, presents to the office for preoperative  evaluation for right total knee replacement.  She has had progressively  worsening pain with failed response with conservative treatment.  Significant decrease in her daily activities due to the ongoing complaint.   CURRENT MEDICATIONS:  Lisinopril, Boniva, Claritin, Darvocet, Centrum  Silver, glucosamine chondroitin, Vicoprofen.   ALLERGIES:  ASPIRIN.   PAST MEDICAL/SURGICAL HISTORY:  1.  Hypertension.  2.  Right femur fracture.  3.  Osteoporosis.  4.  L4-5 spinal stenosis.   SOCIAL HISTORY:  The patient is single.  Denies smoking or ETOH use.  She is  currently retired.   FAMILY HISTORY:  Positive for heart disease.   REVIEW OF SYSTEMS:  Patient wear glasses.  Admits occasional palpitations.  Denies fever, chills, nausea, vomiting, lightheadedness, dizziness, chest  pain, shortness of breath, abdominal pain, dysuria, hematuria, melena,  hematochezia.   PHYSICAL EXAMINATION:  VITAL SIGNS:  Temperature 98.6, pulse 72,  respirations 24, blood pressure 162/74.  Height 5 feet.  Weight 132 pounds.  GENERAL:  A pleasant black female alert and oriented x3 in no acute  distress.  HEENT:  Head is normocephalic and atraumatic.  PERRLA, EOMI.  Oral mucosa is  pink and moist.  NECK:  Good range of motion.  Supple and nontender.  Carotids are intact.  LUNGS:  CTA bilaterally.  No wheezes, rales, or rhonchi.  HEART:  RRR.  S1 and S2.  No murmurs, rubs or  gallops noted.  ABDOMEN:  Round, nondistended.  NBS x4.  Soft and nontender.  GENITOURINARY:  Not examined.  EXTREMITIES:  Gait antalgic.  Right knee range of motion 5-115 degrees.  Positive patellofemoral crepitans.  Medial and lateral joint line  tenderness.  The ligament is stable.  Neurovascularly intact.  Calf  nontender.  Skin warm and dry.   X-rays show bone-on-bone changes with marginal osteophyte formation and  subchondral sclerosis.   ASSESSMENT:  End-stage degenerative joint disease, right knee, and chronic  pain.  Failed conservative treatment.   PLAN:  Right total knee replacement.  The surgical procedure along with  potential risks and complications discussed, including bleeding,  transfusion, infection, nerve damage, fractures, anesthesia complications,  DVT, pulmonary embolism, stiffness, reoperation, and death.  All questions  answered.  She wishes to proceed with surgery as scheduled.      Loreta Ave, M.D.  Electronically Signed     Loreta Ave, M.D.  Electronically Signed    DFM/MEDQ  D:  07/23/2005  T:  07/23/2005  Job:  [id number]

## 2010-06-21 NOTE — Discharge Summary (Signed)
NAME:  Theresa Bauer, Theresa Bauer                   ACCOUNT NO.:  [id number]   MEDICAL RECORD NO.:  [id number]          PATIENT TYPE:  INP   LOCATION:  5003                         FACILITY:  [facility]   PHYSICIAN:  Loreta Ave, M.D. DATE OF BIRTH:  November 22, 1929   DATE OF ADMISSION:  05/26/2005  DATE OF DISCHARGE:                                 DISCHARGE SUMMARY   ADDENDUM:  On June 02, 2005, the patient doing well but had complained of  some mid chest pressure earlier in the a.m.  On-call physician was notified  and Mylanta was ordered.  She says that she had improvement with this.  EKG  was ordered showing normal sinus rhythm at 92 beats per minute.  No acute  changes.  Over the weekend, patient doing extremely well.  She was offered a  SNF bed April 27 but she refused this due to her wanting to stay through the  weekend.  The patient was medically stable for discharge at time of original  summary.  She is ready for transfer to skilled nursing facility today and  bed is available.  She will follow up in the office in two weeks for  recheck.  Knee staples to be removed two weeks postop.  Start Protonix 40 mg  p.o. daily for acid reflux.  Our office will be notified immediately if  there are any problems or concerns for follow-up visit.      Loreta Ave, M.D.  Electronically Signed     Loreta Ave, M.D.  Electronically Signed    DFM/MEDQ  D:  06/02/2005  T:  06/02/2005  Job:  [id number]

## 2010-06-21 NOTE — Discharge Summary (Signed)
NAME:  Theresa Bauer, Theresa Bauer                   ACCOUNT NO.:  [id number]   MEDICAL RECORD NO.:  [id number]          PATIENT TYPE:  INP   LOCATION:  5003                         FACILITY:  [facility]   PHYSICIAN:  Loreta Ave, M.D. DATE OF BIRTH:  Jun 19, 1929   DATE OF ADMISSION:  05/26/2005  DATE OF DISCHARGE:  05/30/2005                                 DISCHARGE SUMMARY   FINAL DIAGNOSES:  1.  Status post right total knee replacement for end-stage degenerative      joint disease.  2.  Hypertension.  3.  Postoperative anemia.  4.  Long-term use anticoagulants.   HISTORY OF PRESENT ILLNESS:  77 year old black female with history of end-  stage DJD right knee and chronic pain presented to our office for  preoperative evaluation for a total knee replacement.  Preoperative x-rays  showed a tricompartmental DJD with bone on bone changes, marginal osteophyte  formation, and subchondral sclerosis.   PRE ADMISSION LABORATORIES:  Sodium 141, potassium 4.2, chloride 102, CO2  29, glucose 84, BUN 9, creatinine 0.7, calcium 10.1, total protein 7.1,  albumin 4.2.  AST 18, ALT 13, alkaline phosphatase 39, T bili 0.8.  UA  negative.  PT 12.7, INR 0.9, PTT 27.   HOSPITAL COURSE:  On May 26, 2005 patient was taken to the [facility]  Operating Room and a right total knee replacement procedure with computer  navigation performed.  Surgeon Loreta Ave, M.D. and assistant Genene Churn.  Barry Dienes, P.A.-C.  Anesthesia general.  Tourniquet time 2 hours.  EBL 100 mL.  There were no surgical specimens.  Hemovac drain x1 placed.  There were no  surgical or anesthesia complications.  Patient was transferred to recovery  in stable condition.  Started on pharmacy protocol Coumadin.  May 27, 2005  patient doing well and comfortable.  Temperature 100.7, pulse 92,  respirations 20, blood pressure 123/49.  Hemoglobin 8.8.  Sodium 134,  potassium 3.6, chloride 103, CO2 25, BUN 3, creatinine 0.7, glucose 165, INR  1.1.   Dressing clean, dry, and intact.  Calves nontender.  Neurovascularly  intact distally.  PT/OT, rehabilitation consults ordered.  May 28, 2005  good pain control.  Patient complaining of feeling weak and fatigued.  No  chest pain, shortness of breath, abdominal pain.  Temperature 99.3, pulse  86, respirations 16, blood pressure 97/55.  Hemoglobin 7.3, hematocrit 22.4.  INR 1.3.  Sodium 133, potassium 3.7, chloride 102, CO2 27, BUN 5, creatinine  0.8, glucose 137.  Wound looked good.  Staples intact.  No signs of  infection.  Hemovac drain pulled.  Calves nontender.  Neurovascularly  intact.  Transfused 2 units packed red blood cells.  Discontinued PCA, O2.  Started FeSo4 325 mg one tablet p.o. b.i.d. with meals.  PT recommending  skilled nursing facility for rehabilitation.  May 29, 2005 patient feeling  much better after transfusion.  Complaining of productive cough with clear  to yellow sputum.  No chest pain, shortness of breath, abdominal pain.  Temperature 100.6, pulse 102, respirations 18, blood pressure 129/74.  Hemoglobin 9.5, hematocrit 28.6.  Electrolytes stable.  INR 1.3.  Wound  looked good.  Staples intact.  No signs of infection.  Calf nontender.  Neurovascularly intact.  Ordered portable AP and lateral chest x-ray.  Discontinued Hep-Lock.  May 30, 2005 good pain control.  No bowel movement  yet.  No complaints of nausea, vomiting, abdominal pain, chest pain,  shortness of breath.  Continues to have minimal cough with clear sputum.  Admits having some nasal drainage.  WBC 7.7, hematocrit 27.5, hemoglobin  9.3, platelets 172.  Sodium 137, potassium 3.8, chloride 102, CO2 29, BUN 4,  creatinine 0.6, glucose 89, INR 1.3.  Chest x-ray yesterday showed mild  cardiomegaly with no infiltrates or effusion.  Knee wound looks good.  Staples intact.  No signs of infection.  Calf nontender.  Neurovascularly  intact.  Patient ready for transfer to skilled nursing facility as bed   available.  She will get a Fleet's enema this a.m.   DISPOSITION:  Transfer to skilled nursing facility for rehabilitation.   MEDICATIONS:  1.  Colace 100 mg p.o. b.i.d.  2.  Lisinopril 20 mg p.o. daily.  3.  Claritin 10 mg p.o. daily.  4.  Pharmacy protocol Coumadin x4 weeks postoperative for DVT prophylaxis.  5.  Travatan one drop each eye q.h.s.  6.  FeSo4 325 mg one tablet p.o. b.i.d. with meals.  7.  Senokot S tablet one p.o. q.h.s. p.r.n. for constipation.  8.  Percocet 5/325 one to two tablets p.o. q.4-6h. p.r.n. for pain.  9.  Robaxin 500 mg one tablet p.o. q.6h. p.r.n. for spasms.  10. Tylenol 325-650 mg one tablet p.o. q.6h. p.r.n. for temperature greater      than 101 Fahrenheit.  11. Restoril 15 mg one tablet p.o. q.h.s. p.r.n. for sleep.   CONDITION ON DISCHARGE:  Good and stable.   INSTRUCTIONS:  She will be transferred to skilled nursing facility for  rehabilitation.  She will continue to work on range of motion,  strengthening, and ambulation.  Weightbearing as tolerated.  Knee staples to  be removed two weeks postoperative.  Dressing changes p.r.n., 4x4s, and  tape.  Continue Coumadin x4 weeks postoperative for DVT prophylaxis.  Maintain INR between 2-3.  She will follow up in our office in two weeks for  a recheck.  Return sooner if needed.      Genene Churn. Denton Meek.      Loreta Ave, M.D.  Electronically Signed    JMO/MEDQ  D:  05/30/2005  T:  05/30/2005  Job:  [id number]

## 2010-06-21 NOTE — Op Note (Signed)
NAME:  Theresa Bauer, Theresa Bauer                   ACCOUNT NO.:  [id number]   MEDICAL RECORD NO.:  [id number]          PATIENT TYPE:  INP   LOCATION:  5003                         FACILITY:  [facility]   PHYSICIAN:  Loreta Ave, M.D. DATE OF BIRTH:  1929-11-23   DATE OF PROCEDURE:  05/26/2005  DATE OF DISCHARGE:                                 OPERATIVE REPORT   INDICATIONS FOR PROCEDURE:  End-stage degenerative arthritis, marked  malalignment with femoral malunion.  Failed conservative therapy.  Meets all  indications for total knee replacement to improve pain and function.   PREOPERATIVE DIAGNOSIS:  End-stage degenerative arthritis, right knee, varus  alignment, malunion femoral shaft increasing degree of varus of the lower  extremity.   POSTOPERATIVE DIAGNOSIS:  End-stage degenerative arthritis, right knee,  varus alignment, malunion femoral shaft increasing degree of varus of the  lower extremity.   PROCEDURE:  Right total knee replacement, [employer] prosthesis, soft  tissue balancing with significant medial capsular release, computer assisted  navigation, cemented #3 posterior stabilized femoral component, cemented #3  tibial component with 100 mm distal stem to off-load the proximal tibia due  to osteoporosis, 11 mm polyethylene posterior stabilized tibial insert, 29 x  9 mm resurfacing patellar component, medial offset.   SURGEON:  Loreta Ave, M.D.   ASSISTANT:  Genene Churn. Barry Dienes, P.A. who was assistant throughout the entire  case and then facilitated the entire procedure.   ANESTHESIA:  General.   ESTIMATED BLOOD LOSS:  Minimal.   TOURNIQUET TIME:  2 hours.   SPECIMENS:  None.   CULTURES:  None.   COMPLICATIONS:  Soft compressive knee immobilizer.   DRAINS:  Hemovac x1.   DESCRIPTION OF PROCEDURE:  The patient was brought to the operating room and  after adequate anesthesia had been obtained, the right knee was examined.  Significant varus at the knee.   Hyperextension despite her degree of varus,  flexion to about 100 degrees, pseudolaxity of the lateral ligaments, but  ligaments grossly intact with contracture medially.  Tourniquet applied and  prepped and draped in the usual sterile fashion.  Exsanguinated with  elevation of Esmarch, tourniquet inflated to 350 mmHg.  Through small stab  wounds x2 over the distal anterior medial femur and proximal anterior medial  tibia guide pins were placed for computer navigation.  Appropriate sensors  attached.  The knee was examined and all landmarks established that could be  done before arthrotomy.  She sat an 18 degrees of varus correctable to  perhaps 9 degrees of varus.  Anterior incision was then made from above the  patella to tibial tubercle.  Skin and subcutaneous tissues divided.  Minimally invasive approach with a medial arthrotomy and vastus splitting  preserving quad tendon.  Knee exposed.  Remnants of menisci, cruciate  ligaments, excessive fat pad all completed.  I then established the rest of  the landmarks for computer guidance.  Significant medial capsular release.  The femur exposed.  With the computer assistance, I did a 10 mm resection  off the distal femur allowing the mechanical axis which took  into account  the deformity in the femoral shaft.  Distal cut made.  Matching epicondylar  axis sized for #3 component, jig put in place, definitive cuts were made.  There were grade 4 changes throughout.  Once the femur was prepared, trial  put in place and found to fit well.  Attention was turned to the tibia.  Proximal cut with computer assistance resecting 9 mm laterally and 2-3 mm  medially, extremely osteoporotic over the proximal tibia laterally and  distal femur laterally where she had been doing no weightbearing, 0 degree  cut on the tibia.  When that was completed, the patella was exposed.  Marked  degeneration and osteoporosis.  It resected the posterior 9 mm.  Sized and   drilled for the 28 mm x 9 mm component.  Trials put in place throughout  after copious irrigation and all recesses probed and cleared out.  With the  #3 component on the femur, #3 component on the tibia, and an 11 mm insert, I  had nice hip balance, good patellofemoral tracking, but this was after the  significant medial cups were released.  Still a little pseudolaxity  laterally, but much better than before.  I think very acceptable stability.  She had a little bit more than full extension and slight hyperextension.  By  computer guidance, we were at a 0 degree mechanical axis and alignment of  all components was excellent.  All trials were removed.  Because of a very  soft proximal tibia and my concern over subsidence, I elected to put in a  stem over the tibial component.  The tibia had been marked for rotation and  was then hand-reamed for the component and the stem.  Copious irrigation.  Cement prepared and placed on all components.  The components were assembled  with the pegs on the femur and the same on the tibia.  Firmly seated,  excessive cement removed, polyethylene attached, patella attached.  Once the  cement had hardened the knee was examined.  Full extension and full flexion.  Good stability, good alignment, good patellofemoral tracking.  We had  deflated the tourniquet and hemostasis with cautery before the final  components were in.  Hemovac was placed and brought out through a separate  stab wound.  Arthrotomy closed with #1 Vicryl.  Skin and subcutaneous tissue  with Vicryl and staples.  Knee injected with Marcaine and Hemovac clamped.  Sterile compressive dressing applied.  Tourniquet inflated and removed.  Knee immobilizer applied.  Anesthesia reversed.  Brought to recovery room.  Tolerated the surgery well with no complications.      Loreta Ave, M.D.  Electronically Signed    DFM/MEDQ  D:  05/28/2005  T:  05/29/2005  Job:  [id number]

## 2010-07-25 ENCOUNTER — Encounter (INDEPENDENT_AMBULATORY_CARE_PROVIDER_SITE_OTHER): Payer: Medicare Other | Admitting: Vascular Surgery

## 2010-07-25 DIAGNOSIS — M7989 Other specified soft tissue disorders: Secondary | ICD-10-CM

## 2010-07-25 DIAGNOSIS — M79609 Pain in unspecified limb: Secondary | ICD-10-CM

## 2010-07-26 NOTE — Assessment & Plan Note (Signed)
OFFICE VISIT  Theresa Bauer, Theresa Bauer I DOB:  November 15, 1929                                       07/25/2010 ZOXWR#:[id number]  CHIEF COMPLAINT:  Discoloration of skin right leg.  HISTORY OF PRESENT ILLNESS:  The patient is a 77 year old female who over the last year has developed progressive darkened discoloration at the medial aspect of her right leg.  She states that in the past she had some discoloration here in this same area 15 years ago but this resolved.  The patient denies any significant trauma to her lower extremities.  However, she has had bilateral knee replacements.  She does not know any prior history of DVT.  She denies any prior history, personal or family history of hypercoagulable state.  CHRONIC MEDICAL PROBLEMS:  Include hypertension and glaucoma.  These are currently stable and primarily followed by Dr. Concepcion Elk.  PAST SURGICAL HISTORY:  Bilateral knee replacements and a temporary feeding tube.  SOCIAL HISTORY:  She is single.  She is retired.  She is a nonsmoker, nonconsumer of alcohol.  FAMILY HISTORY:  Mother had a myocardial infarction at age 12, father had a myocardial infarction at age 66.  REVIEW OF SYSTEMS:  She is 5 feet tall. GI:  She has history of reflux and constipation. HEMATOLOGIC:  She has history of anemia. All other systems were negative.  MEDICATIONS:  Amlodipine, loratadine, ranitidine, metoprolol, Centrum Silver, Fish Oil, Travatan, alendronate.  ALLERGIES:  She has allergies listed to penicillin, aspirin and lisinopril.  PHYSICAL EXAM:  Blood pressure is 153/78 in the left arm, heart rate 57 and regular, respirations 14.  HEENT:  Unremarkable.  Neck:  Has 2+ carotid pulses.  Chest:  Clear to auscultation.  Cardiac:  Regular rate and rhythm without murmur.  Abdomen:  Soft, nontender, nondistended.  No masses.  Musculoskeletal:  Shows no obvious major joint deformities. Neurologic:  Symmetric upper extremity and lower  extremity motor strength which is 5/5.  Skin:  Has no open ulcers.  There is darkish discoloration in the gaiter area of the right lower extremity extending approximately 12 cm lengthwise and 5 cm width wise.  There is no open ulceration.  This is consistent with hemosiderin staining.  Lower extremity pulses are 2+ femoral, 2+ dorsalis pedis.  Feet are pink, warm and well-perfused.  I reviewed a recent duplex ultrasound which showed no evidence of DVT. This was dated December of 2011.  In summary, the patient has hemosiderin staining at the medial aspect of her right leg.  This most likely is due to some component of venous insufficiency.  I do not believe that she needs any further intervention other than compression therapy which Dr. Concepcion Elk has only prescribed. She may have some component of superficial venous incompetence, however, her symptoms are not really debilitating.  She was more just worried about the coloration of her skin and I do not believe that this is reversible.  I believe the best option would be continued compression therapy.  If she develops ulcerations at some point in the future we could consider further evaluation.  Otherwise she will follow up on an as-needed basis.    Janetta Hora. Fields, MD Electronically Signed  CEF/MEDQ  D:  07/25/2010  T:  07/26/2010  Job:  [id number]  cc:   Fleet Contras, M.D.

## 2010-10-25 LAB — URINALYSIS, ROUTINE W REFLEX MICROSCOPIC
Glucose, UA: NEGATIVE
Ketones, ur: NEGATIVE
Protein, ur: NEGATIVE
Urobilinogen, UA: 0.2

## 2010-10-25 LAB — I-STAT 8, (EC8 V) (CONVERTED LAB)
Acid-Base Excess: 3 — ABNORMAL HIGH
Bicarbonate: 28.9 — ABNORMAL HIGH
Chloride: 106
HCT: 40
Operator id: 198171
TCO2: 30
pCO2, Ven: 47.7
pH, Ven: 7.39 — ABNORMAL HIGH

## 2010-10-25 LAB — POCT I-STAT CREATININE: Operator id: 198171

## 2010-10-25 LAB — POCT CARDIAC MARKERS
CKMB, poc: <1 — ABNORMAL LOW
Myoglobin, poc: 34.6
Troponin i, poc: <0.05

## 2010-11-15 LAB — BASIC METABOLIC PANEL
BUN: 2 — ABNORMAL LOW
BUN: 3 — ABNORMAL LOW
BUN: 3 — ABNORMAL LOW
CO2: 25
CO2: 29
Calcium: 8 — ABNORMAL LOW
Calcium: 8.4
Calcium: 8.6
Calcium: 8.7
Calcium: 9.1
Chloride: 105
Creatinine, Ser: 0.45
Creatinine, Ser: 0.56
Creatinine, Ser: 0.61
GFR calc Af Amer: >60
GFR calc non Af Amer: >60
GFR calc non Af Amer: >60
GFR calc non Af Amer: >60
Glucose, Bld: 106 — ABNORMAL HIGH
Glucose, Bld: 149 — ABNORMAL HIGH
Glucose, Bld: 98
Potassium: 3.4 — ABNORMAL LOW
Sodium: 137

## 2010-11-15 LAB — PROTIME-INR
INR: 1.2
INR: 1.5
Prothrombin Time: 15.6 — ABNORMAL HIGH
Prothrombin Time: 15.8 — ABNORMAL HIGH
Prothrombin Time: 16.2 — ABNORMAL HIGH

## 2010-11-15 LAB — URINE MICROSCOPIC-ADD ON

## 2010-11-15 LAB — CBC
HCT: 27.6 — ABNORMAL LOW
Hemoglobin: 11 — ABNORMAL LOW
MCHC: 32.7
MCHC: 33.1
MCV: 74.6 — ABNORMAL LOW
Platelets: 141 — ABNORMAL LOW
Platelets: 160
Platelets: 226
Platelets: 229
RBC: 3.27 — ABNORMAL LOW
RDW: 15.4 — ABNORMAL HIGH
RDW: 15.5 — ABNORMAL HIGH
RDW: 15.9 — ABNORMAL HIGH
RDW: 16.8 — ABNORMAL HIGH
WBC: 6.7
WBC: 7.4

## 2010-11-15 LAB — URINALYSIS, ROUTINE W REFLEX MICROSCOPIC
Glucose, UA: NEGATIVE
Hgb urine dipstick: NEGATIVE
Specific Gravity, Urine: 1.009
Urobilinogen, UA: 0.2
pH: 7.5

## 2010-11-15 LAB — CROSSMATCH
ABO/RH(D): O POS
Antibody Screen: NEGATIVE

## 2010-11-15 LAB — URINE CULTURE

## 2010-11-18 LAB — CBC
HCT: 26.7 — ABNORMAL LOW
HCT: 36
Platelets: 162
Platelets: 234
WBC: 5.1
WBC: 5.1

## 2010-11-18 LAB — BASIC METABOLIC PANEL
BUN: 3 — ABNORMAL LOW
GFR calc non Af Amer: >60
Potassium: 3.9
Sodium: 135

## 2010-11-18 LAB — URINALYSIS, ROUTINE W REFLEX MICROSCOPIC
Bilirubin Urine: NEGATIVE
Hgb urine dipstick: NEGATIVE
Specific Gravity, Urine: 1.01
pH: 7.5

## 2010-11-18 LAB — COMPREHENSIVE METABOLIC PANEL
AST: 19
Albumin: 4.2
Alkaline Phosphatase: 58
Chloride: 101
GFR calc Af Amer: >60
Potassium: 3.4 — ABNORMAL LOW
Total Bilirubin: 0.7

## 2010-11-18 LAB — PROTIME-INR: INR: 1.1

## 2010-11-18 LAB — TYPE AND SCREEN: Antibody Screen: NEGATIVE

## 2011-06-23 ENCOUNTER — Other Ambulatory Visit (HOSPITAL_COMMUNITY): Payer: Self-pay | Admitting: Internal Medicine

## 2011-06-23 DIAGNOSIS — Z1231 Encounter for screening mammogram for malignant neoplasm of breast: Secondary | ICD-10-CM

## 2011-07-18 ENCOUNTER — Ambulatory Visit (HOSPITAL_COMMUNITY)
Admission: RE | Admit: 2011-07-18 | Discharge: 2011-07-18 | Disposition: A | Discharge status: Home / Self Care | Payer: Medicare Other | Source: Ambulatory Visit | Attending: Internal Medicine | Admitting: Internal Medicine

## 2011-07-18 DIAGNOSIS — Z1231 Encounter for screening mammogram for malignant neoplasm of breast: Secondary | ICD-10-CM | POA: Insufficient documentation

## 2011-10-08 ENCOUNTER — Emergency Department (HOSPITAL_COMMUNITY): Payer: Medicare Other

## 2011-10-08 ENCOUNTER — Encounter (HOSPITAL_COMMUNITY): Payer: Self-pay

## 2011-10-08 ENCOUNTER — Emergency Department (HOSPITAL_COMMUNITY)
Admission: EM | Admit: 2011-10-08 | Discharge: 2011-10-08 | Disposition: A | Discharge status: Home / Self Care | Payer: Medicare Other | Attending: Emergency Medicine | Admitting: Emergency Medicine

## 2011-10-08 DIAGNOSIS — M545 Low back pain, unspecified: Secondary | ICD-10-CM | POA: Insufficient documentation

## 2011-10-08 DIAGNOSIS — M25559 Pain in unspecified hip: Secondary | ICD-10-CM | POA: Insufficient documentation

## 2011-10-08 DIAGNOSIS — Z96649 Presence of unspecified artificial hip joint: Secondary | ICD-10-CM | POA: Insufficient documentation

## 2011-10-08 DIAGNOSIS — K219 Gastro-esophageal reflux disease without esophagitis: Secondary | ICD-10-CM | POA: Insufficient documentation

## 2011-10-08 DIAGNOSIS — Z79899 Other long term (current) drug therapy: Secondary | ICD-10-CM | POA: Insufficient documentation

## 2011-10-08 DIAGNOSIS — I10 Essential (primary) hypertension: Secondary | ICD-10-CM | POA: Insufficient documentation

## 2011-10-08 HISTORY — DX: Essential (primary) hypertension: I10

## 2011-10-08 HISTORY — DX: Gastro-esophageal reflux disease without esophagitis: K21.9

## 2011-10-08 MED ORDER — HYDROCODONE-ACETAMINOPHEN 5-500 MG PO TABS: 1.0000 | ORAL_TABLET | Freq: Four times a day (QID) | ORAL | Status: AC | PRN

## 2011-10-08 MED ORDER — HYDROCODONE-ACETAMINOPHEN 5-325 MG PO TABS
1.0000 | ORAL_TABLET | Freq: Once | ORAL | Status: AC
  Administered 2011-10-08: 1 via ORAL
  Filled 2011-10-08: qty 1

## 2011-10-08 NOTE — ED Provider Notes (Signed)
History     CSN: [id number]  Arrival date & time 10/08/11  0020   First MD Initiated Contact with Patient 10/08/11 0300      Chief Complaint  Patient presents with  . Hip Pain    bilateral w/out trauma    (Consider location/radiation/quality/duration/timing/severity/associated sxs/prior treatment) HPI Comments: Patient for eval of several day history of pain in the buttocks into both legs.  No injury or trauma.  No weakness or numbness.  No bowel or bladder complaints.  No fevers or chills.    Patient is a 78 y.o. female presenting with hip pain. The history is provided by the patient.  Hip Pain This is a new problem. The current episode started 2 days ago. The problem occurs constantly. The problem has been gradually worsening. Pertinent negatives include no abdominal pain. Exacerbated by: ambulating, weight bearing. Nothing relieves the symptoms.    Past Medical History  Diagnosis Date  . Hypertension   . GERD (gastroesophageal reflux disease)     Past Surgical History  Procedure Date  . Replacement total knee bilateral 2007 & 2008    Family History  Problem Relation Age of Onset  . Heart attack Mother   . Heart attack Father   . Osteoarthritis Father     History  Substance Use Topics  . Smoking status: Never Smoker   . Smokeless tobacco: Not on file  . Alcohol Use: No    OB History    Grav Para Term Preterm Abortions TAB SAB Ect Mult Living   5 5 5  0 0           Review of Systems  Gastrointestinal: Negative for abdominal pain.  All other systems reviewed and are negative.    Allergies  Darvocet; Aspirin; and Penicillins  Home Medications   Current Outpatient Rx  Name Route Sig Dispense Refill  . ACETAMINOPHEN-CODEINE #3 300-30 MG PO TABS Oral Take 1 tablet by mouth 2 (two) times daily as needed. For pain    . AMLODIPINE BESYLATE 5 MG PO TABS Oral Take 5 mg by mouth daily.    Marland Kitchen DOXYCYCLINE HYCLATE 100 MG PO CAPS Oral Take 100 mg by mouth 2 (two)  times daily.    Marland Kitchen LORATADINE 10 MG PO TABS Oral Take 10 mg by mouth daily.    Marland Kitchen RANITIDINE HCL 150 MG PO TABS Oral Take 150 mg by mouth daily.      BP 161/85  Pulse 105  Temp 99.1 F (37.3 C) (Oral)  Resp 21  Ht 5' (1.524 m)  Wt 155 lb (70.308 kg)  BMI 30.27 kg/m2  SpO2 100%  Physical Exam  Nursing note and vitals reviewed. Constitutional: She is oriented to person, place, and time. She appears well-developed and well-nourished. No distress.  HENT:  Head: Normocephalic and atraumatic.  Neck: Normal range of motion. Neck supple.  Musculoskeletal: Normal range of motion. She exhibits no edema.  Neurological: She is alert and oriented to person, place, and time.  Skin: Skin is warm and dry. She is not diaphoretic.    ED Course  Procedures (including critical care time)  Labs Reviewed - No data to display Dg Lumbar Spine Complete  10/08/2011  *RADIOLOGY REPORT*  Clinical Data: Low back and bilateral hip pain since yesterday  LUMBAR SPINE - COMPLETE 4+ VIEW  Comparison: None  Findings: Osseous demineralization. Five non-rib bearing lumbar vertebrae. Facet degenerative changes L4-L5 and L5-S1, less L3-L4. Minimal retrolisthesis L4-L5. Slight disc space narrowing L4-L5 and L5-S1. Vertebral  body heights maintained without fracture or additional subluxation. SI joints symmetric.  IMPRESSION: Degenerative disc and facet disease changes of the lower lumbar spine as above.   Original Report Authenticated By: Lollie Marrow, M.D.    Dg Pelvis 1-2 Views  10/08/2011  *RADIOLOGY REPORT*  Clinical Data: Low back and bilateral hip pain since yesterday  PELVIS - 1-2 VIEW  Comparison: None  Findings: Osseous demineralization. Symmetric hip and SI joints. Scattered injection granulomata in the buttocks. No acute fracture, dislocation or bone destruction. Question pelvic phleboliths.  IMPRESSION: Osseous demineralization. No acute abnormalities.   Original Report Authenticated By: Lollie Marrow, M.D.       No diagnosis found.    MDM  The patient is complaining of pain that sounds musculoskeletal in nature.  The abdomen is benign and strength, motor, and pulses are intact distally.  She is feeling better after receiving a pain pill and will be discharged to home with the same.  I strongly believe her symptoms are musculoskeletal in nature and have little suspicion for a more acute, insidious process.          Geoffery Lyons, MD 10/08/11 [phone]

## 2011-10-08 NOTE — ED Notes (Signed)
Pt currently on fx bedpan

## 2011-10-08 NOTE — ED Notes (Signed)
The patient is AOx4 and comfortable with her discharge instructions.  She is calling to have a family member pick her up.

## 2011-10-08 NOTE — ED Notes (Signed)
EMS advised that the patient said she started feeling bilateral hip pain radiating down into both legs yesterday at 1500.  The patient denies trauma of any kind and denies a hx of sciatica.

## 2011-10-08 NOTE — ED Notes (Signed)
Patient transported to X-ray 

## 2012-01-19 ENCOUNTER — Other Ambulatory Visit: Payer: Self-pay | Admitting: Ophthalmology

## 2012-01-19 NOTE — H&P (Signed)
Pre-operative History and Physical for Ophthalmic Surgery  Theresa Bauer 01/19/2012                  Chief Complaint: Decreased vision, difficulty reading and watching trelevision. My vision is dark  VA: 20/100 left eye  Diagnosis:  Combined Cataract  Allergies  Allergen Reactions  . Darvocet (Propoxyphene-Acetaminophen) Itching  . Aspirin     itching  . Penicillins     itching    yes  Reaction:  Lisinopril, itching  Prior to Admission medications   Medication Sig Start Date End Date Taking? Authorizing Provider  acetaminophen-codeine (TYLENOL #3) 300-30 MG per tablet Take 1 tablet by mouth 2 (two) times daily as needed. For pain    Historical Provider, MD  amLODipine (NORVASC) 5 MG tablet Take 5 mg by mouth daily.    Historical Provider, MD  doxycycline (VIBRAMYCIN) 100 MG capsule Take 100 mg by mouth 2 (two) times daily.    Historical Provider, MD  loratadine (CLARITIN) 10 MG tablet Take 10 mg by mouth daily.    Historical Provider, MD  ranitidine (ZANTAC) 150 MG tablet Take 150 mg by mouth daily.    Historical Provider, MD    Planned Procedure:                                       Phacoemulsification, Posterior Chamber Intra-ocular Lens Left Eye                                      Acrysof MA50BM + 22.00  Diopter PC IOL for implant OS   There were no vitals filed for this visit.  Pulse: 76         Temp: NE        Resp:  16     ROS: negative  Past Medical History  Diagnosis Date  . Hypertension   . GERD (gastroesophageal reflux disease)     Past Surgical History  Procedure Date  . Replacement total knee bilateral 2007 & 2008     History   Social History  . Marital Status: Widowed    Spouse Name: N/A    Number of Children: N/A  . Years of Education: N/A   Occupational History  . Not on file.   Social History Main Topics  . Smoking status: Never Smoker   . Smokeless tobacco: Not on file  . Alcohol Use: No  . Drug Use:   . Sexually Active: Not  Currently   Other Topics Concern  . Not on file   Social History Narrative  . No narrative on file     The following examination is for anesthesia clearance for minimally invasive Ophthalmic surgery. It is primarily to document heart and lung findings and is not intended to elucidate unknown general medical conditions inclusive of abdominal masses, lung lesions, etc.   General Constitution:  within normal limits   Alertness/Orientation:  Person, time place     yes   HEENT:  Eye Findings:  Combined Cataract                   left eye  Neck: supple without masses  Chest/Lungs: clear to auscultation  Cardiac: Normal S1 and S2 without Murmur, S3 or S4  Neuro: non-focal   Impression:  Combined Cataract, ,  visually significant   Planned Procedure: Phacoemulsification, Posterior Chamber Intraocular Lens Left eye    Shade Flood, MD

## 2012-02-10 ENCOUNTER — Encounter (HOSPITAL_COMMUNITY)
Admission: RE | Admit: 2012-02-10 | Discharge: 2012-02-10 | Payer: Medicare Other | Source: Ambulatory Visit | Attending: Ophthalmology | Admitting: Ophthalmology

## 2012-02-10 NOTE — Progress Notes (Signed)
Pt did not show up for PAT appointment I called her and she said she is not having surgery.  She said she told someone who called and she thought they would tell Dr Isla Pence, I instructed her to call Dr Crist Fat office.  I called and spoke with Dr Isla Pence and informed herof what patient said and she said she would cancel her surgery.

## 2012-02-10 NOTE — Pre-Procedure Instructions (Signed)
20 RAEVIN WIERENGA  02/10/2012   Your procedure is scheduled on:  Wednesday, January 15th.  Report to [facility] at 12:45 PM.  Call this number if you have problems the morning of surgery: [phone]   Remember: Nothing to eat or drink after Midnight.    Take these medicines the morning of surgery with A SIP OF WATER: Amlodipine (Norvasc), Doxycycline (Vibramycin), Loratadine (Claritin) and Ranitidine (Zantac).        May take Tylenol if needed.    Do not wear jewelry, make-up or nail polish.  Do not wear lotions, powders, or perfumes. You may wear deodorant.  Do not shave 48 hours prior to surgery. Men may shave face and neck.  Do not bring valuables to the hospital.  Contacts, dentures or bridgework may not be worn into surgery.  Leave suitcase in the car. After surgery it may be brought to your room.  For patients admitted to the hospital, checkout time is 11:00 AM the day of discharge.   Patients discharged the day of surgery will not be allowed to drive home.  Name and phone number of your driver:_________    Special Instructions: Shower using CHG 2 nights before surgery and the night before surgery.  If you shower the day of surgery use CHG.  Use special wash - you have one bottle of CHG for all showers.  You should use approximately 1/3 of the bottle for each shower.   Please read over the following fact sheets that you were given: Pain Booklet, Coughing and Deep Breathing and Surgical Site Infection Prevention

## 2012-02-18 ENCOUNTER — Ambulatory Visit: Admit: 2012-02-18 | Payer: Self-pay | Admitting: Ophthalmology

## 2012-02-18 SURGERY — PHACOEMULSIFICATION, CATARACT, WITH IOL INSERTION
Anesthesia: Monitor Anesthesia Care | Laterality: Left

## 2012-04-06 IMAGING — CR DG CHEST 1V PORT
1 series · 1 of 1 positions shown · non-contrast
Comparison: 02/13/2010

CLINICAL DATA: Respiratory distress.

PORTABLE CHEST - 1 VIEW

[AP]
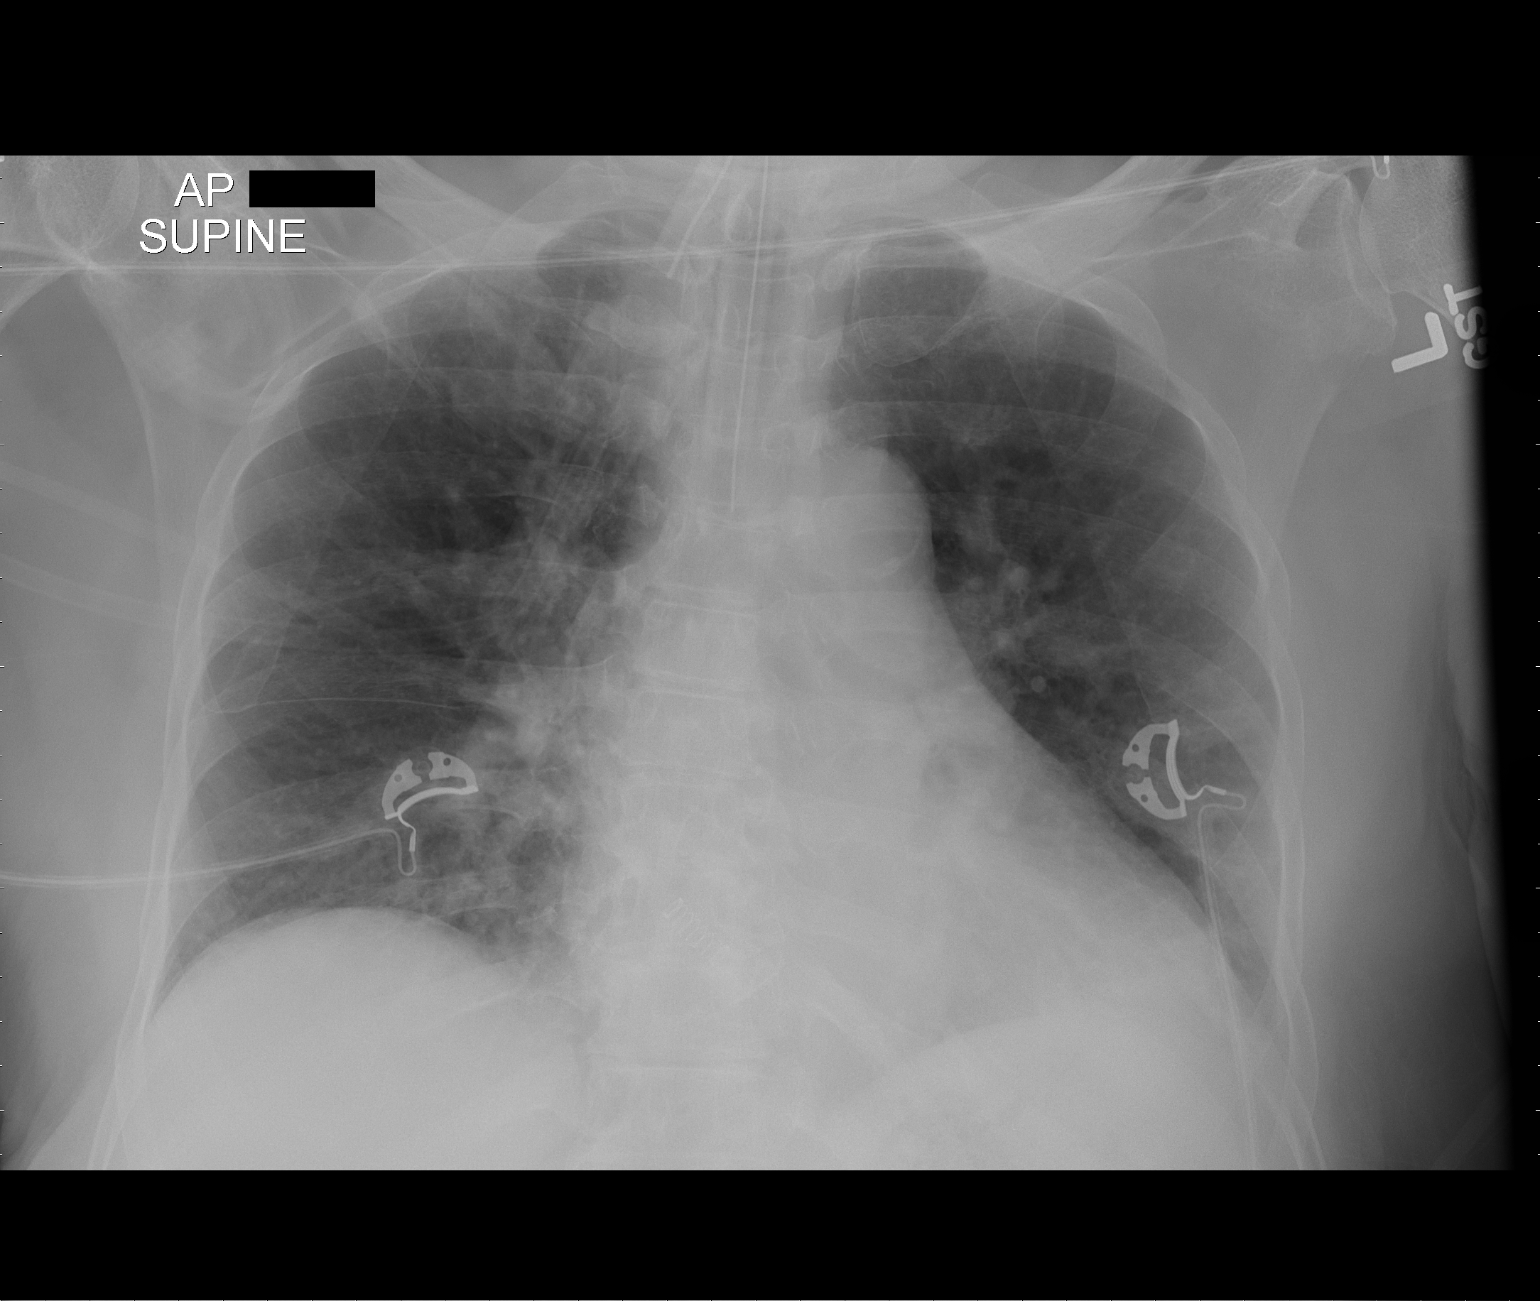

[1 of 1 positions shown; findings below may reference images not displayed]

FINDINGS: An endotracheal tube is identified with tip 1.5 cm above
the carina.
The cardiomediastinal silhouette is within normal limits.
Pulmonary vascular congestion is identified with mild bibasilar
atelectasis.
There is no evidence of pleural effusion or pneumothorax.
IMPRESSION: Endotracheal tube with tip 1.5 cm above the carina.

Pulmonary vascular congestion with mild bibasilar atelectasis.

## 2012-07-08 ENCOUNTER — Other Ambulatory Visit (HOSPITAL_COMMUNITY): Payer: Self-pay | Admitting: Internal Medicine

## 2012-07-08 DIAGNOSIS — Z1231 Encounter for screening mammogram for malignant neoplasm of breast: Secondary | ICD-10-CM

## 2012-07-23 ENCOUNTER — Ambulatory Visit (HOSPITAL_COMMUNITY)
Admission: RE | Admit: 2012-07-23 | Discharge: 2012-07-23 | Disposition: A | Discharge status: Home / Self Care | Payer: Medicare Other | Source: Ambulatory Visit | Attending: Internal Medicine | Admitting: Internal Medicine

## 2012-07-23 DIAGNOSIS — Z1231 Encounter for screening mammogram for malignant neoplasm of breast: Secondary | ICD-10-CM

## 2012-09-07 ENCOUNTER — Encounter (INDEPENDENT_AMBULATORY_CARE_PROVIDER_SITE_OTHER): Payer: Medicare Other | Admitting: Ophthalmology

## 2012-09-07 DIAGNOSIS — H2510 Age-related nuclear cataract, unspecified eye: Secondary | ICD-10-CM

## 2012-09-07 DIAGNOSIS — H348192 Central retinal vein occlusion, unspecified eye, stable: Secondary | ICD-10-CM

## 2012-09-07 DIAGNOSIS — H3530 Unspecified macular degeneration: Secondary | ICD-10-CM

## 2012-09-07 DIAGNOSIS — H43819 Vitreous degeneration, unspecified eye: Secondary | ICD-10-CM

## 2012-09-07 DIAGNOSIS — H35039 Hypertensive retinopathy, unspecified eye: Secondary | ICD-10-CM

## 2012-09-07 DIAGNOSIS — I10 Essential (primary) hypertension: Secondary | ICD-10-CM

## 2012-09-17 ENCOUNTER — Ambulatory Visit: Payer: Medicare Other | Attending: Internal Medicine

## 2012-09-17 DIAGNOSIS — R269 Unspecified abnormalities of gait and mobility: Secondary | ICD-10-CM | POA: Insufficient documentation

## 2012-09-17 DIAGNOSIS — M25569 Pain in unspecified knee: Secondary | ICD-10-CM | POA: Insufficient documentation

## 2012-09-17 DIAGNOSIS — IMO0001 Reserved for inherently not codable concepts without codable children: Secondary | ICD-10-CM | POA: Insufficient documentation

## 2012-09-17 DIAGNOSIS — Z96659 Presence of unspecified artificial knee joint: Secondary | ICD-10-CM | POA: Insufficient documentation

## 2012-09-17 DIAGNOSIS — R609 Edema, unspecified: Secondary | ICD-10-CM | POA: Insufficient documentation

## 2012-09-17 DIAGNOSIS — Z9181 History of falling: Secondary | ICD-10-CM | POA: Insufficient documentation

## 2012-09-17 DIAGNOSIS — R262 Difficulty in walking, not elsewhere classified: Secondary | ICD-10-CM | POA: Insufficient documentation

## 2012-09-22 ENCOUNTER — Ambulatory Visit: Payer: Medicare Other | Admitting: Physical Therapy

## 2012-09-28 ENCOUNTER — Ambulatory Visit: Payer: Medicare Other | Admitting: Physical Therapy

## 2012-09-30 ENCOUNTER — Observation Stay (HOSPITAL_COMMUNITY)
Admission: EM | Admit: 2012-09-30 | Discharge: 2012-10-02 | Disposition: A | Discharge status: Home / Self Care | Payer: Medicare Other | Attending: Internal Medicine | Admitting: Internal Medicine

## 2012-09-30 ENCOUNTER — Ambulatory Visit: Payer: Medicare Other | Admitting: Physical Therapy

## 2012-09-30 ENCOUNTER — Encounter (HOSPITAL_COMMUNITY): Payer: Self-pay | Admitting: Nurse Practitioner

## 2012-09-30 ENCOUNTER — Emergency Department (HOSPITAL_COMMUNITY): Payer: Medicare Other

## 2012-09-30 ENCOUNTER — Observation Stay (HOSPITAL_COMMUNITY): Payer: Medicare Other

## 2012-09-30 DIAGNOSIS — M810 Age-related osteoporosis without current pathological fracture: Secondary | ICD-10-CM | POA: Insufficient documentation

## 2012-09-30 DIAGNOSIS — H349 Unspecified retinal vascular occlusion: Secondary | ICD-10-CM | POA: Insufficient documentation

## 2012-09-30 DIAGNOSIS — K219 Gastro-esophageal reflux disease without esophagitis: Secondary | ICD-10-CM | POA: Insufficient documentation

## 2012-09-30 DIAGNOSIS — Z96659 Presence of unspecified artificial knee joint: Secondary | ICD-10-CM | POA: Insufficient documentation

## 2012-09-30 DIAGNOSIS — R0602 Shortness of breath: Secondary | ICD-10-CM | POA: Insufficient documentation

## 2012-09-30 DIAGNOSIS — I10 Essential (primary) hypertension: Secondary | ICD-10-CM | POA: Insufficient documentation

## 2012-09-30 DIAGNOSIS — H34232 Retinal artery branch occlusion, left eye: Secondary | ICD-10-CM | POA: Diagnosis present

## 2012-09-30 DIAGNOSIS — H34239 Retinal artery branch occlusion, unspecified eye: Secondary | ICD-10-CM | POA: Insufficient documentation

## 2012-09-30 DIAGNOSIS — I4949 Other premature depolarization: Secondary | ICD-10-CM | POA: Insufficient documentation

## 2012-09-30 DIAGNOSIS — I498 Other specified cardiac arrhythmias: Secondary | ICD-10-CM | POA: Insufficient documentation

## 2012-09-30 DIAGNOSIS — R079 Chest pain, unspecified: Principal | ICD-10-CM | POA: Insufficient documentation

## 2012-09-30 DIAGNOSIS — I872 Venous insufficiency (chronic) (peripheral): Secondary | ICD-10-CM | POA: Insufficient documentation

## 2012-09-30 DIAGNOSIS — M79609 Pain in unspecified limb: Secondary | ICD-10-CM | POA: Insufficient documentation

## 2012-09-30 DIAGNOSIS — Z79899 Other long term (current) drug therapy: Secondary | ICD-10-CM | POA: Insufficient documentation

## 2012-09-30 LAB — CBC
MCH: 24.2 pg — ABNORMAL LOW (ref 26.0–34.0)
MCV: 69.9 fL — ABNORMAL LOW (ref 78.0–100.0)
Platelets: 213 10*3/uL (ref 150–400)
RBC: 4.95 MIL/uL (ref 3.87–5.11)
RDW: 15 % (ref 11.5–15.5)
WBC: 5.3 10*3/uL (ref 4.0–10.5)

## 2012-09-30 LAB — PROTIME-INR
INR: 0.93 (ref 0.00–1.49)
Prothrombin Time: 12.3 seconds (ref 11.6–15.2)

## 2012-09-30 LAB — CBC WITH DIFFERENTIAL/PLATELET
Eosinophils Relative: 2 % (ref 0–5)
HCT: 36.5 % (ref 36.0–46.0)
Hemoglobin: 12.4 g/dL (ref 12.0–15.0)
Lymphocytes Relative: 59 % — ABNORMAL HIGH (ref 12–46)
MCHC: 34 g/dL (ref 30.0–36.0)
MCV: 70.1 fL — ABNORMAL LOW (ref 78.0–100.0)
Monocytes Absolute: 0.5 10*3/uL (ref 0.1–1.0)
Monocytes Relative: 10 % (ref 3–12)
Neutro Abs: 1.3 10*3/uL — ABNORMAL LOW (ref 1.7–7.7)
WBC: 4.6 10*3/uL (ref 4.0–10.5)

## 2012-09-30 LAB — BASIC METABOLIC PANEL
Calcium: 9.8 mg/dL (ref 8.4–10.5)
Creatinine, Ser: 0.72 mg/dL (ref 0.50–1.10)
GFR calc non Af Amer: 78 mL/min — ABNORMAL LOW (ref >90)
Sodium: 140 mEq/L (ref 135–145)

## 2012-09-30 LAB — COMPREHENSIVE METABOLIC PANEL
ALT: 9 U/L (ref 0–35)
Alkaline Phosphatase: 52 U/L (ref 39–117)
BUN: 12 mg/dL (ref 6–23)
CO2: 28 mEq/L (ref 19–32)
Chloride: 102 mEq/L (ref 96–112)
GFR calc Af Amer: >90 mL/min (ref >90)
GFR calc non Af Amer: 81 mL/min — ABNORMAL LOW (ref >90)
Glucose, Bld: 79 mg/dL (ref 70–99)
Potassium: 3.5 mEq/L (ref 3.5–5.1)
Sodium: 141 mEq/L (ref 135–145)
Total Bilirubin: 0.5 mg/dL (ref 0.3–1.2)

## 2012-09-30 LAB — POCT I-STAT TROPONIN I: Troponin i, poc: 0 ng/mL (ref 0.00–0.08)

## 2012-09-30 MED ORDER — LORATADINE 10 MG PO TABS
10.0000 mg | ORAL_TABLET | Freq: Every day | ORAL | Status: DC
  Administered 2012-10-01 – 2012-10-02 (×2): 10 mg via ORAL
  Filled 2012-09-30 (×3): qty 1

## 2012-09-30 MED ORDER — AMLODIPINE BESYLATE 5 MG PO TABS
5.0000 mg | ORAL_TABLET | Freq: Every day | ORAL | Status: DC
  Administered 2012-10-01 – 2012-10-02 (×2): 5 mg via ORAL
  Filled 2012-09-30 (×3): qty 1

## 2012-09-30 MED ORDER — ACETAMINOPHEN-CODEINE #3 300-30 MG PO TABS: 1.0000 | ORAL_TABLET | Freq: Two times a day (BID) | ORAL | Status: DC | PRN

## 2012-09-30 MED ORDER — IOHEXOL 350 MG/ML SOLN
100.0000 mL | Freq: Once | INTRAVENOUS | Status: AC | PRN
  Administered 2012-09-30: 100 mL via INTRAVENOUS

## 2012-09-30 MED ORDER — ALENDRONATE SODIUM 70 MG PO TABS: 70.0000 mg | ORAL_TABLET | ORAL | Status: DC

## 2012-09-30 MED ORDER — ADULT MULTIVITAMIN W/MINERALS CH
1.0000 | ORAL_TABLET | Freq: Every day | ORAL | Status: DC
  Administered 2012-10-01 – 2012-10-02 (×2): 1 via ORAL
  Filled 2012-09-30 (×3): qty 1

## 2012-09-30 MED ORDER — ENOXAPARIN SODIUM 40 MG/0.4ML ~~LOC~~ SOLN
40.0000 mg | SUBCUTANEOUS | Status: DC
  Administered 2012-10-01: 40 mg via SUBCUTANEOUS
  Filled 2012-09-30 (×3): qty 0.4

## 2012-09-30 MED ORDER — SODIUM CHLORIDE 0.9 % IV SOLN
INTRAVENOUS | Status: DC
  Administered 2012-09-30: 20:00:00 via INTRAVENOUS

## 2012-09-30 MED ORDER — LATANOPROST 0.005 % OP SOLN
1.0000 [drp] | Freq: Every day | OPHTHALMIC | Status: DC
  Administered 2012-09-30 – 2012-10-01 (×2): 1 [drp] via OPHTHALMIC
  Filled 2012-09-30: qty 2.5

## 2012-09-30 MED ORDER — HYDROCHLOROTHIAZIDE 25 MG PO TABS
12.5000 mg | ORAL_TABLET | Freq: Every day | ORAL | Status: DC
  Filled 2012-09-30 (×2): qty 0.5

## 2012-09-30 MED ORDER — FAMOTIDINE 20 MG PO TABS
20.0000 mg | ORAL_TABLET | Freq: Every day | ORAL | Status: DC
  Administered 2012-10-01 – 2012-10-02 (×2): 20 mg via ORAL
  Filled 2012-09-30 (×3): qty 1

## 2012-09-30 MED ORDER — NITROGLYCERIN 0.4 MG SL SUBL: 0.4000 mg | SUBLINGUAL_TABLET | SUBLINGUAL | Status: DC | PRN

## 2012-09-30 MED ORDER — ONDANSETRON HCL 4 MG/2ML IJ SOLN: 4.0000 mg | Freq: Four times a day (QID) | INTRAMUSCULAR | Status: DC | PRN

## 2012-09-30 NOTE — ED Notes (Signed)
Report attempted to be called.  Floor told RN unable to take report right now and will call back.

## 2012-09-30 NOTE — ED Notes (Signed)
Pt c/o pain to bilateral legs.

## 2012-09-30 NOTE — ED Notes (Signed)
C/o heaviness in chest and pain radiating down L arm and in her back x 2 days. Denies SOB. A&Ox4, resp e/u

## 2012-09-30 NOTE — ED Provider Notes (Signed)
I saw and evaluated the patient, reviewed the resident's note and I agree with the findings and plan. Patient complaining of having left-sided chest pain that is atypical x2 days.. The constant at times. Associated with movement and exertion. Patient's EKG was without evidence of ACS. Patient will require admission for evaluation  Toy Baker, MD 09/30/12 1456

## 2012-09-30 NOTE — ED Provider Notes (Signed)
CSN: [id number]     Arrival date & time 09/30/12  1259 History   First MD Initiated Contact with Patient 09/30/12 1314     Chief Complaint  Patient presents with  . Chest Pain   (Consider location/radiation/quality/duration/timing/severity/associated sxs/prior Treatment) HPI 79 y o B F with PMH of GERD, HTN, chronic venous insufficiency, presented today with c/o of chest pain of 2 days duration, center of her chest, intermittent, described as pressure-like, with arm pain, but pt cant tell if the chest pain is radiating to her arm,  Pain started while she was sleeping, not related to activity. Pt thinks pain is better when she is lying down, pain not aggravated by coughing or deep breathing. No fever, no cough, never smoked. Also reports falling recently- last fall 3 weeks ago, no prior weakness or dizziness, no loss of consciousness, pt has had bilateral knee replacement surgery.    Past Medical History  Diagnosis Date  . Hypertension   . GERD (gastroesophageal reflux disease)    Past Surgical History  Procedure Laterality Date  . Replacement total knee bilateral  2007 & 2008   Family History  Problem Relation Age of Onset  . Heart attack Mother   . Heart attack Father   . Osteoarthritis Father    History  Substance Use Topics  . Smoking status: Never Smoker   . Smokeless tobacco: Not on file  . Alcohol Use: No   OB History   Grav Para Term Preterm Abortions TAB SAB Ect Mult Living   5 5 5  0 0          Review of Systems CONSTITUTIONAL-No Fever, weightloss, night sweat,or change in appetite. SKIN- Complains of long term discolouration of her Rt lower extremity,with mild swelling from chronic venous insufficiency, no redness. HEAD- No Headache. RESPIRATORY- No cough or SOB. GI- No nausea or vomiting, diarrhoea, constipation, abd pain, jaundice, last colonoscopy. URINARY- No Frequency or dysuria. NEUROLOGIC- No Numbness or syncope. PYSCH- No crying spells or suicidal  ideation.  Allergies  Lisinopril; Darvocet; Aspirin; and Penicillins  Home Medications   Current Outpatient Rx  Name  Route  Sig  Dispense  Refill  . acetaminophen-codeine (TYLENOL #3) 300-30 MG per tablet   Oral   Take 1 tablet by mouth 2 (two) times daily as needed. For pain         . alendronate (FOSAMAX) 70 MG tablet   Oral   Take 70 mg by mouth every 7 (seven) days. Take with a full glass of water on an empty stomach.         Marland Kitchen amLODipine (NORVASC) 5 MG tablet   Oral   Take 5 mg by mouth daily.         . hydrochlorothiazide (HYDRODIURIL) 12.5 MG tablet   Oral   Take 12.5 mg by mouth daily.         Marland Kitchen loratadine (CLARITIN) 10 MG tablet   Oral   Take 10 mg by mouth daily.         . Multiple Vitamins-Minerals (MULTIVITAMINS THER. W/MINERALS) TABS tablet   Oral   Take 1 tablet by mouth daily.         . Omega-3 Fatty Acids (FISH OIL PO)   Oral   Take 1 capsule by mouth daily.         . ranitidine (ZANTAC) 150 MG tablet   Oral   Take 150 mg by mouth daily.         . Travoprost,  BAK Free, (TRAVATAN) 0.004 % SOLN ophthalmic solution   Both Eyes   Place 1 drop into both eyes at bedtime.          BP 141/86  Pulse 93  Resp 18  SpO2 98% Physical Exam GENERAL- alert, co-operative, appears as stated age, not in any distress. HEENT- Atraumatic, normocephalic, PERRL, EOMI, oral mucosa appears moist, wear dentures.No carotid bruit, no cervical LN enlargement, thyroid does not appear enlarged. CARDIAC- RRR, no murmurs, rubs or gallops, chest pain reproducible on palpation of the sternal area. RESP- Moving equal volumes of air, and clear to auscultation bilaterally. ABDOMEN- Soft,non tender, no palpable masses or organomegaly, bowel sounds present. BACK- Normal curvature of the spine, No tenderness along the vertebrae, no CVA tenderness. NEURO- Cr N 2-12 intact, strenght equal and present in all extremities. EXTREMITIES- pulse 2+, symmetric, chronic mild  swelling and tenderness of lower extremities, no redness. SKIN- Warm, dry, No rash or lesion. PSYCH- Normal mood and affect, appropriate thought content and speech.  ED Course  Procedures (including critical care time) EKG- Rate 64bpm, regular, sinus rhythm,no ST or T wave abnormalities, normal axis.  Labs Review Labs Reviewed  BASIC METABOLIC PANEL - Abnormal; Notable for the following:    GFR calc non Af Amer 78 (*)    All other components within normal limits  CBC - Abnormal; Notable for the following:    HCT 34.6 (*)    MCV 69.9 (*)    MCH 24.2 (*)    All other components within normal limits  POCT I-STAT TROPONIN I   Imaging Review Dg Chest 2 View  09/30/2012   *RADIOLOGY REPORT*  Clinical Data: Shortness of breath.  Weakness.  CHEST - 2 VIEW  Comparison: Plain film chest 02/16/2010 and CT chest 03/18/2007.  Findings: There is mild cardiomegaly.  Lungs are clear.  No pneumothorax or pleural effusion.  IMPRESSION: Mild cardiomegaly without acute disease.   Original Report Authenticated By: Holley Dexter, M.D.    MDM  Chest pain- Clinical impression not totally consistent with CAD, but considering pt's age and hx of HTN, R/o MI-  POC-troponins- Negative and EKG- no findings concerning for ACS. Chest Xray- no acute abnormalities. BMP and CBC also ordered.  - Patient to be admitted to internal medicine service for observation, and continued management.    Kennis Carina, MD 09/30/12 1525

## 2012-09-30 NOTE — H&P (Addendum)
[facility] History and Physical  Theresa Bauer:[id number] DOB: 1929-12-25 DOA: 09/30/2012  Referring physician:  PCP: Dorrene German, MD  Specialists:   Chief Complaint: Chest pain  HPI: Theresa Bauer is a 79 y.o. BF PMHx cataracts,(per patient's family) left eye stroke  (diagnosed June 2014 Dr. Mateo Flow her ophthalmologist), HTN, venous insufficiency chronic, S/P bilateral TKA 2007/2008, GERD, Presented today with c/o of chest pain of 3 days duration. States first episode woke her from sleep, substernal, described as heavy pressure on her chest, positive radiation down left arm, positive SOB, negative N./V. pain lasted for approximately 10 minutes and then resolved. The next day patient experienced an episode of positive left arm pain, negative SOB, negative N./V. pain resolved in approximately 10 minutes. Also states has been having increasing bilateral lower calf pain. Note reports falling recently- last fall 3 weeks ago, no prior weakness or dizziness, no loss of consciousness, pt has had bilateral knee replacement surgery. Currently troponin x1 negative. Patient states negative chest pain, negative arm pain    Review of Systems: The patient denies anorexia, fever, weight loss,, decreased hearing, hoarseness, syncope,  peripheral edema, balance deficits, hemoptysis, abdominal pain, melena, hematochezia, severe indigestion/heartburn, hematuria, incontinence, genital sores, muscle weakness, suspicious skin lesions,  difficulty walking, depression, unusual weight change, abnormal bleeding, enlarged lymph nodes, angioedema, and breast masses.     Procedure CXR 09/30/2012 Mild cardiomegaly without acute disease    Past Medical History  Diagnosis Date  . Hypertension   . GERD (gastroesophageal reflux disease)    Past Surgical History  Procedure Laterality Date  . Replacement total knee bilateral  2007 & 2008   Social History:  reports that she has never smoked. She does not  have any smokeless tobacco history on file. She reports that she does not drink alcohol. Her drug history is not on file.    Allergies  Allergen Reactions  . Lisinopril Anaphylaxis  . Darvocet [Propoxyphene-Acetaminophen] Itching  . Aspirin     itching  . Penicillins     itching    Family History  Problem Relation Age of Onset  . Heart attack Mother   . Heart attack Father   . Osteoarthritis Father       Prior to Admission medications   Medication Sig Start Date End Date Taking? Authorizing Provider  acetaminophen-codeine (TYLENOL #3) 300-30 MG per tablet Take 1 tablet by mouth 2 (two) times daily as needed. For pain   Yes Historical Provider, MD  alendronate (FOSAMAX) 70 MG tablet Take 70 mg by mouth every 7 (seven) days. Take with a full glass of water on an empty stomach.   Yes Historical Provider, MD  amLODipine (NORVASC) 5 MG tablet Take 5 mg by mouth daily.   Yes Historical Provider, MD  hydrochlorothiazide (HYDRODIURIL) 12.5 MG tablet Take 12.5 mg by mouth daily.   Yes Historical Provider, MD  loratadine (CLARITIN) 10 MG tablet Take 10 mg by mouth daily.   Yes Historical Provider, MD  Multiple Vitamins-Minerals (MULTIVITAMINS THER. W/MINERALS) TABS tablet Take 1 tablet by mouth daily.   Yes Historical Provider, MD  Omega-3 Fatty Acids (FISH OIL PO) Take 1 capsule by mouth daily.   Yes Historical Provider, MD  ranitidine (ZANTAC) 150 MG tablet Take 150 mg by mouth daily.   Yes Historical Provider, MD  Travoprost, BAK Free, (TRAVATAN) 0.004 % SOLN ophthalmic solution Place 1 drop into both eyes at bedtime.   Yes Historical Provider, MD     Physical Exam:  Filed Vitals:   09/30/12 1445 09/30/12 1515 09/30/12 1600 09/30/12 1638  BP: 136/62 153/68 139/65   Pulse: 70 71 69   Temp:    98.8 F (37.1 C)  TempSrc:    Oral  Resp: 17 23 24    Weight:    69.7 kg (153 lb 10.6 oz)  SpO2: 100% 98% 99%      General: A/O x 4, NAD  Eyes: Pupils equal round reactive to light and  accommodation  Cardiovascular: Regular rhythm and rate, negative murmurs rubs or gallops, DP/PT pulse nonpalpable  Respiratory: Clear to auscultation bilateral  Abdomen: Soft nontender nondistended plus bowel sounds  Musculoskeletal: Reproducible pain on palpation to the left bicep, and sternal area. However not the same as the pain which the patient from sleep, bilateral negative Homans sign , bilateral positive pain with dorsiflexion  Neurologic: Intact  Labs on Admission:  Basic Metabolic Panel:  Recent Labs Lab 09/30/12 1305  NA 140  K 3.6  CL 101  CO2 29  GLUCOSE 89  BUN 14  CREATININE 0.72  CALCIUM 9.8   Liver Function Tests: No results found for this basename: AST, ALT, ALKPHOS, BILITOT, PROT, ALBUMIN,  in the last 168 hours No results found for this basename: LIPASE, AMYLASE,  in the last 168 hours No results found for this basename: AMMONIA,  in the last 168 hours CBC:  Recent Labs Lab 09/30/12 1305  WBC 5.3  HGB 12.0  HCT 34.6*  MCV 69.9*  PLT 213   Cardiac Enzymes: No results found for this basename: CKTOTAL, CKMB, CKMBINDEX, TROPONINI,  in the last 168 hours  BNP (last 3 results) No results found for this basename: PROBNP,  in the last 8760 hours CBG: No results found for this basename: GLUCAP,  in the last 168 hours  Radiological Exams on Admission: Dg Chest 2 View  09/30/2012   *RADIOLOGY REPORT*  Clinical Data: Shortness of breath.  Weakness.  CHEST - 2 VIEW  Comparison: Plain film chest 02/16/2010 and CT chest 03/18/2007.  Findings: There is mild cardiomegaly.  Lungs are clear.  No pneumothorax or pleural effusion.  IMPRESSION: Mild cardiomegaly without acute disease.   Original Report Authenticated By: Holley Dexter, M.D.    EKG: Normal sinus rhythm  Assessment/Plan Active Problems:   HYPERTENSION   VENOUS INSUFFICIENCY, CHRONIC   CHEST PAIN   1. Chest pain; admit overnight and rule out MI serial cardiac enzymes --Will rule out PE  with CTA --Will rule out bilateral DVT with ultrasound   2.  Retinal artery occlusion ; currently not on any anticoagulation, patient allergic to aspirin, will hold on starting any stronger anticoagulation until we can obtain records from [facility]  3. HTN; stable on current medication  4. chronic venous insufficiency; see #1    Code Status: Full Family Communication: Plan of care discussed with patient, son, daughter Disposition Plan:   Time spent: 60 minutes  Drema Dallas [facility] Pager [phone]  If 7PM-7AM, please contact night-coverage [email or web address] Password [facility] 09/30/2012, 4:51 PM

## 2012-09-30 NOTE — ED Notes (Signed)
Pt assisted with use of the bedpan.

## 2012-09-30 NOTE — ED Notes (Signed)
MD Allen at bedside.

## 2012-10-01 ENCOUNTER — Encounter (HOSPITAL_COMMUNITY): Payer: Self-pay | Admitting: Physician Assistant

## 2012-10-01 DIAGNOSIS — R0989 Other specified symptoms and signs involving the circulatory and respiratory systems: Secondary | ICD-10-CM

## 2012-10-01 DIAGNOSIS — M79609 Pain in unspecified limb: Secondary | ICD-10-CM

## 2012-10-01 LAB — CBC WITH DIFFERENTIAL/PLATELET
Basophils Relative: 1 % (ref 0–1)
Eosinophils Relative: 2 % (ref 0–5)
HCT: 33.3 % — ABNORMAL LOW (ref 36.0–46.0)
Hemoglobin: 11.1 g/dL — ABNORMAL LOW (ref 12.0–15.0)
MCH: 23.3 pg — ABNORMAL LOW (ref 26.0–34.0)
MCHC: 33.3 g/dL (ref 30.0–36.0)
MCV: 70 fL — ABNORMAL LOW (ref 78.0–100.0)
Monocytes Absolute: 0.5 10*3/uL (ref 0.1–1.0)
Monocytes Relative: 11 % (ref 3–12)
Neutro Abs: 1.1 10*3/uL — ABNORMAL LOW (ref 1.7–7.7)

## 2012-10-01 LAB — COMPREHENSIVE METABOLIC PANEL
Albumin: 3.3 g/dL — ABNORMAL LOW (ref 3.5–5.2)
BUN: 14 mg/dL (ref 6–23)
Chloride: 102 mEq/L (ref 96–112)
Creatinine, Ser: 0.78 mg/dL (ref 0.50–1.10)
GFR calc non Af Amer: 76 mL/min — ABNORMAL LOW (ref >90)
Total Bilirubin: 0.5 mg/dL (ref 0.3–1.2)

## 2012-10-01 LAB — HEMOGLOBIN A1C: Mean Plasma Glucose: 117 mg/dL — ABNORMAL HIGH (ref <117)

## 2012-10-01 LAB — TROPONIN I: Troponin I: <0.3 ng/mL (ref <0.30)

## 2012-10-01 LAB — MAGNESIUM: Magnesium: 2 mg/dL (ref 1.5–2.5)

## 2012-10-01 MED ORDER — PROMETHAZINE HCL 25 MG/ML IJ SOLN
12.5000 mg | Freq: Four times a day (QID) | INTRAMUSCULAR | Status: DC | PRN
  Filled 2012-10-01: qty 1

## 2012-10-01 MED ORDER — REGADENOSON 0.4 MG/5ML IV SOLN
0.4000 mg | Freq: Once | INTRAVENOUS | Status: AC
  Administered 2012-10-02: 0.4 mg via INTRAVENOUS
  Filled 2012-10-01: qty 5

## 2012-10-01 MED ORDER — HYDROCHLOROTHIAZIDE 12.5 MG PO CAPS
12.5000 mg | ORAL_CAPSULE | Freq: Every day | ORAL | Status: DC
  Administered 2012-10-01 – 2012-10-02 (×2): 12.5 mg via ORAL
  Filled 2012-10-01 (×2): qty 1

## 2012-10-01 NOTE — Progress Notes (Signed)
Large amt of emesis (WONTON SOUP) noted in basin @ bedside which sister brought in for lunch. Complaints of nausea and abdominal cramping . MD made aware , and order noted.

## 2012-10-01 NOTE — Progress Notes (Signed)
VASCULAR LAB PRELIMINARY  PRELIMINARY  PRELIMINARY  PRELIMINARY  Bilateral lower extremity venous duplex and Carotid duplex  completed.    Preliminary report:   VENOUS:  Bilateral:  No evidence of DVT, superficial thrombosis, or Baker's Cyst.   CAROTID:   Bilateral:  1-39% ICA stenosis.  Vertebral artery flow is antegrade.      Helmer Dull, RVT 10/01/2012, 12:11 PM

## 2012-10-01 NOTE — Progress Notes (Signed)
[facility] PROGRESS NOTE  Theresa Bauer ZHY:[id number] DOB: [birth year] DOA: 09/30/2012 PCP: Dorrene German, MD  Assessment/Plan: Active Problems:   HYPERTENSION   VENOUS INSUFFICIENCY, CHRONIC   CHEST PAIN   Osteoporosis, unspecified   Branch retinal artery occlusion, left eye    1. Chest pain: Patient presented with recurrent central chest pain, radiating to her left arm, over 3 days. 12-Lead EKG shows no acute ischemic changes, cardiac enzymes are unelevated, and CXR/CTA show no acute findings. She has been asymptomatic overnight. Given history and risk factors, will benefit from stratification. Have consulted cardiology.  2. Retinal artery occlusion: This ocurred in 07/2012, diagnosed by Dr Marena Chancy, ophthalmologist. Allergic to SA. May need to consider Plavix.  3. HTN: Controlled on pre-admission antihypertensive medication 4. Chronic venous insufficiency: Patient does complain of LE cramps. She has no obvious LE swelling or tenderness. Venous dopplers are pending.    Code Status: Full Code.  Family Communication: Disposition Plan: to be determined. Disposition, per cardiology.    Brief narrative: 79 y.o. BF with history of cataracts,(per patient's family) left eye stroke (diagnosed June 2014 Dr. Mateo Flow her ophthalmologist), HTN, chronic venous insufficiency, OA, s/p bilateral TKA 2007/2008, GERD, presenting with c/o of chest pain of 3 days duration. States first episode woke her from sleep, substernal, described as heavy pressure on her chest, positive radiation down left arm, positive SOB, negative N./V. pain lasted for approximately 10 minutes and then resolved. The next day patient experienced an episode of positive left arm pain, negative SOB, negative N./V. pain resolved in approximately 10 minutes. Also states has been having increasing bilateral lower calf pain. Reports falling recently- last fall 3 weeks ago, no prior weakness or dizziness, no loss of  consciousness. Admitted for further management.    Consultants:  Cardiology.   Procedures:  CXR.   CTA.   Antibiotics:  N/A.   HPI/Subjective: Asymptomatic  Objective: Vital signs in last 24 hours: Temp:  [98.2 F (36.8 C)-98.8 F (37.1 C)] 98.2 F (36.8 C) (08/29 0501) Pulse Rate:  [63-93] 63 (08/29 0501) Resp:  [16-24] 18 (08/29 0501) BP: (130-163)/(62-86) 146/78 mmHg (08/29 0501) SpO2:  [96 %-100 %] 100 % (08/29 0501) Weight:  [68.8 kg (151 lb 10.8 oz)-69.7 kg (153 lb 10.6 oz)] 68.8 kg (151 lb 10.8 oz) (08/29 0501) Weight change:  Last BM Date: 09/29/12  Intake/Output from previous day: 08/28 0701 - 08/29 0700 In: -  Out: 750 [Urine:750]     Physical Exam: General: Comfortable, alert, communicative, fully oriented, not short of breath at rest.  HEENT:  No clinical pallor, no jaundice, no conjunctival injection or discharge. Hydration is fair. NECK:  Supple, JVP not seen, no carotid bruits, no palpable lymphadenopathy, no palpable goiter. CHEST:  Clinically clear to auscultation, no wheezes, no crackles. HEART:  Sounds 1 and 2 heard, normal, regular, no murmurs. ABDOMEN:  Full, soft, non-tender, no palpable organomegaly, no palpable masses, normal bowel sounds. GENITALIA:  Not examined. LOWER EXTREMITIES:  No pitting edema, palpable peripheral pulses. MUSCULOSKELETAL SYSTEM:  Generalized osteoarthritic changes, otherwise, normal. CENTRAL NERVOUS SYSTEM:  No focal neurologic deficit on gross examination.  Lab Results:  Recent Labs  09/30/12 1748 10/01/12 0635  WBC 4.6 4.0  HGB 12.4 11.1*  HCT 36.5 33.3*  PLT 196 195    Recent Labs  09/30/12 1305 09/30/12 1748  NA 140 141  K 3.6 3.5  CL 101 102  CO2 29 28  GLUCOSE 89 79  BUN 14 12  CREATININE  0.72 0.66  CALCIUM 9.8 9.8   No results found for this or any previous visit (from the past 240 hour(s)).   Studies/Results: Dg Chest 2 View  09/30/2012   *RADIOLOGY REPORT*  Clinical Data:  Shortness of breath.  Weakness.  CHEST - 2 VIEW  Comparison: Plain film chest 02/16/2010 and CT chest 03/18/2007.  Findings: There is mild cardiomegaly.  Lungs are clear.  No pneumothorax or pleural effusion.  IMPRESSION: Mild cardiomegaly without acute disease.   Original Report Authenticated By: Holley Dexter, M.D.   Ct Angio Chest Pe W/cm &/or Wo Cm  10/01/2012   *RADIOLOGY REPORT*  Clinical Data: Recent CVA, now with chest pain and shortness of breath, evaluate for pulmonary embolism  CT ANGIOGRAPHY CHEST  Technique:  Multidetector CT imaging of the chest using the standard protocol during bolus administration of intravenous contrast. Multiplanar reconstructed images including MIPs were obtained and reviewed to evaluate the vascular anatomy.  Contrast: OMNIPAQUE IOHEXOL 350 MG/ML SOLN  Comparison: Chest radiograph - earlier same day; chest CT - 03/18/2007  Vascular Findings:  There is adequate opacification of the pulmonary arterial system of the main pulmonary artery measuring 280 HU.  There are no filling defects within the pulmonary arterial tree to suggest pulmonary embolism.  Normal caliber of the main pulmonary artery.  Borderline cardiomegaly.  No pericardial effusion.  Normal caliber of the thoracic aorta.  Atherosclerotic calcifications within the undersurface of the aortic arch.  No definite thoracic aortic dissection or periaortic stranding. Conventional configuration of the aortic arch.  ----------------------------------------------------------------  Nonvascular findings:  There is minimal subpleural ground-glass atelectasis.  No focal airspace opacities.  No pleural effusion or pneumothorax.  The central pulmonary airways are patent.  Linear approximately 6 mm subpleural nodule within the right upper lobe (image 42, series six) is unchanged.  No new pulmonary nodules.  No mediastinal, hilar or axillary lymphadenopathy.  Grossly unchanged approximately 1.3 cm hypoattenuating (7 HU) cyst  within the dome of the left lobe liver (image 65, series four).  No acute or aggressive osseous abnormalities.  Mild scoliotic curvature of the thoracic spine, convex to the right, possibly positional.  IMPRESSION:  1.  No acute cardiopulmonary disease.  Specifically, no evidence of pulmonary embolism. 2.  Unchanged approximately 6 mm right upper lobe nodule, stable since the 2009 examination and thus of benign etiology.   Original Report Authenticated By: Tacey Ruiz, MD    Medications: Scheduled Meds: . amLODipine  5 mg Oral Daily  . enoxaparin (LOVENOX) injection  40 mg Subcutaneous Q24H  . famotidine  20 mg Oral Daily  . hydrochlorothiazide  12.5 mg Oral Daily  . latanoprost  1 drop Both Eyes QHS  . loratadine  10 mg Oral Daily  . multivitamin with minerals  1 tablet Oral Daily   Continuous Infusions: . sodium chloride 50 mL/hr at 09/30/12 1953   PRN Meds:.acetaminophen-codeine, nitroGLYCERIN, ondansetron (ZOFRAN) IV    LOS: 1 day   Leilani Cespedes,CHRISTOPHER  [facility] Pager [phone]. If 8PM-8AM, please contact night-coverage at [email or web address], password [facility] 10/01/2012, 7:35 AM  LOS: 1 day

## 2012-10-01 NOTE — Consult Note (Signed)
CARDIOLOGY CONSULT NOTE   Patient ID: Theresa Bauer MRN: [id number] DOB/AGE: [birth year] 79 y.o.  Admit date: 09/30/2012  Primary Physician   Dorrene German, MD Primary Cardiologist   New Reason for Consultation   Chest pain  YQM:VHQI Theresa Bauer is a 79 y.o. female with no history of CAD.  She reports intermittent chest pain that wakes her at night and is decreased by drinking something, such as tea. She will then go back to bed and be able to sleep. It is a pressure and 9/10. Duration unclear. Frequency unclear but has not had > 10 times. She is able to hang clothes and clean house without chest pain.   She was wakened by chest pain 8/25. It resolved. She had left arm pain that started 8/26 and has not stopped. She gets this pain when using her cane, but it will stop if she changes arms. She had some "mild" 7/10 chest pain 8/27, not exertional, duration unclear, she took no meds. It made her a little SOB. She had that pain again yesterday when at rehab and was sent to the ER. Currently, she is resting comfortably. There was not change in the pain with deep inspiration or cough.   She has not had orthopnea or PND. She has some chronic edema in her legs and had an episode of right-sided arm pain that went down her leg. She also has had leg/foot cramps. She denies any DOE. Her balance is poor and she has fallen recently. PT wants her to use a walker, but she does not.    Past Medical History  Diagnosis Date  . Hypertension   . GERD (gastroesophageal reflux disease)     Past Surgical History  Procedure Laterality Date  . Replacement total knee bilateral  2007/2008   Allergies  Allergen Reactions  . Lisinopril Anaphylaxis  . Darvocet [Propoxyphene-Acetaminophen] Itching  . Aspirin     itching  . Penicillins     itching    Theresa have reviewed the patient's current medications . amLODipine  5 mg Oral Daily  . enoxaparin (LOVENOX) injection  40 mg Subcutaneous Q24H  . famotidine  20 mg Oral  Daily  . hydrochlorothiazide  12.5 mg Oral Daily  . latanoprost  1 drop Both Eyes QHS  . loratadine  10 mg Oral Daily  . multivitamin with minerals  1 tablet Oral Daily   . sodium chloride 50 mL/hr at 09/30/12 1953   acetaminophen-codeine, nitroGLYCERIN, ondansetron (ZOFRAN) IV  Prior to Admission medications   Medication Sig  acetaminophen-codeine (TYLENOL #3) 300-30 MG per tablet Take 1 tablet by mouth 2 (two) times daily as needed. For pain  alendronate (FOSAMAX) 70 MG tablet Take 70 mg by mouth every 7 (seven) days. Take with a full glass of water on an empty stomach.  amLODipine (NORVASC) 5 MG tablet Take 5 mg by mouth daily.  hydrochlorothiazide (HYDRODIURIL) 12.5 MG tablet Take 12.5 mg by mouth daily.  loratadine (CLARITIN) 10 MG tablet Take 10 mg by mouth daily.  Multiple Vitamins-Minerals (MULTIVITAMINS THER. W/MINERALS) TABS tablet Take 1 tablet by mouth daily.  ranitidine (ZANTAC) 150 MG tablet Take 150 mg by mouth daily.  Travoprost, BAK Free, (TRAVATAN) 0.004 % SOLN ophthalmic solution Place 1 drop into both eyes at bedtime.     History   Social History  . Marital Status: Widowed    Spouse Name: N/A    Number of Children: N/A  . Years of Education: N/A   Occupational History  .  Retired    Social History Main Topics  . Smoking status: Never Smoker   . Smokeless tobacco: Not on file  . Alcohol Use: No  . Drug Use: No  . Sexual Activity: Not Currently   Other Topics Concern  . Not on file   Social History Narrative   Lives alone in apartment. Cares for self. Rides the bus. Dtr helps.    Family Status  Relation Status Death Age  . Mother Deceased   . Father Deceased    Family History  Problem Relation Age of Onset  . Heart attack Mother   . Heart attack Father   . Osteoarthritis Father     ROS: Has cataracts, needs surgery. Retinal artery  Full 14 point review of systems complete and found to be negative unless listed above.  Physical Exam: Blood  pressure 146/78, pulse 63, temperature 98.2 F (36.8 C), temperature source Oral, resp. rate 18, weight 151 lb 10.8 oz (68.8 kg), SpO2 100.00%.  General: Well developed, well nourished, elderly female in no acute distress Head: Eyes PERRLA, No xanthomas.   Normocephalic and atraumatic, oropharynx without edema or exudate. Dentition: poor Lungs: few rales bases,  Heart: HRRR S1 S2, no rub/gallop, 2/6 murmur. pulses are 2+ all 4 extrem.   Chest:  Tender  Does not bring on pressure. Neck: No carotid bruits. No lymphadenopathy.  JVD not elevated. Abdomen: Bowel sounds present, abdomen soft and non-tender without masses or hernias noted. Msk:  No spine or cva tenderness. Generalized weakness, no joint deformities or effusions. Extremities: No clubbing or cyanosis. Trace edema. Mild Varicose veins bilateral LE Neuro: Alert and oriented X 3. No focal deficits noted. Psych:  Good affect, responds appropriately Skin: No rashes or lesions noted.  Labs:   Lab Results  Component Value Date   WBC 4.0 10/01/2012   HGB 11.1* 10/01/2012   HCT 33.3* 10/01/2012   MCV 70.0* 10/01/2012   PLT 195 10/01/2012    Recent Labs  09/30/12 1748  INR 0.93     Recent Labs Lab 10/01/12 0635  NA 140  K 3.6  CL 102  CO2 27  BUN 14  CREATININE 0.78  CALCIUM 9.2  PROT 6.8  BILITOT 0.5  ALKPHOS 44  ALT 7  AST 15  GLUCOSE 88   Magnesium  Date Value Range Status  10/01/2012 2.0  1.5 - 2.5 mg/dL Final    Recent Labs  40/98/11 1748 09/30/12 2248 10/01/12 0635  TROPONINI <0.30 <0.30 <0.30    Recent Labs  09/30/12 1337  TROPIPOC 0.00   Pro B Natriuretic peptide (BNP)  Date/Time Value Range Status  09/30/2012  5:48 PM 142.7  0 - 450 pg/mL Final  02/13/2010  3:13 AM 81.0  0.0 - 100.0 pg/mL Final   TSH  Date/Time Value Range Status  09/30/2012  5:48 PM 1.421  0.350 - 4.500 uIU/mL Final     Performed at [facility]    ECG:  SR, rate 76, no acute changes  Radiology:  Dg Chest 2  View 09/30/2012   *RADIOLOGY REPORT*  Clinical Data: Shortness of breath.  Weakness.  CHEST - 2 VIEW  Comparison: Plain film chest 02/16/2010 and CT chest 03/18/2007.  Findings: There is mild cardiomegaly.  Lungs are clear.  No pneumothorax or pleural effusion.  IMPRESSION: Mild cardiomegaly without acute disease.   Original Report Authenticated By: Holley Dexter, M.D.   Ct Angio Chest Pe W/cm &/or Wo Cm 10/01/2012   *RADIOLOGY REPORT*  Clinical Data:  Recent CVA, now with chest pain and shortness of breath, evaluate for pulmonary embolism  CT ANGIOGRAPHY CHEST  Technique:  Multidetector CT imaging of the chest using the standard protocol during bolus administration of intravenous contrast. Multiplanar reconstructed images including MIPs were obtained and reviewed to evaluate the vascular anatomy.  Contrast: OMNIPAQUE IOHEXOL 350 MG/ML SOLN  Comparison: Chest radiograph - earlier same day; chest CT - 03/18/2007  Vascular Findings:  There is adequate opacification of the pulmonary arterial system of the main pulmonary artery measuring 280 HU.  There are no filling defects within the pulmonary arterial tree to suggest pulmonary embolism.  Normal caliber of the main pulmonary artery.  Borderline cardiomegaly.  No pericardial effusion.  Normal caliber of the thoracic aorta.  Atherosclerotic calcifications within the undersurface of the aortic arch.  No definite thoracic aortic dissection or periaortic stranding. Conventional configuration of the aortic arch.  Nonvascular findings:  There is minimal subpleural ground-glass atelectasis.  No focal airspace opacities.  No pleural effusion or pneumothorax.  The central pulmonary airways are patent.  Linear approximately 6 mm subpleural nodule within the right upper lobe (image 42, series six) is unchanged.  No new pulmonary nodules.  No mediastinal, hilar or axillary lymphadenopathy.  Grossly unchanged approximately 1.3 cm hypoattenuating (7 HU) cyst within the  dome of the left lobe liver (image 65, series four).  No acute or aggressive osseous abnormalities.  Mild scoliotic curvature of the thoracic spine, convex to the right, possibly positional.  IMPRESSION:  1.  No acute cardiopulmonary disease.  Specifically, no evidence of pulmonary embolism. 2.  Unchanged approximately 6 mm right upper lobe nodule, stable since the 2009 examination and thus of benign etiology.   Original Report Authenticated By: Tacey Ruiz, MD    ASSESSMENT AND PLAN:   The patient was seen today by Dr. Tenny Craw, the patient evaluated and the data reviewed.     CHEST PAIN - patinet has some sharp chest pains that do not appear to be cardiac, probable musculoskeletal  She also has some chest pressure  This is not typical.  Not worse with activity  She is not too active.  She has multiple CRFs. She has ruled out for MI. Will order stress test for am, Lexiscan MIBI     HYPERTENSION  Follow       Branch retinal artery occlusion, left eye  Patient told had CVA by ophthy.  Would recomm carotid doppler  Continue telemetry.  Falls  Agree that patient should have rehab.   SignedTheodore Demark, PA-C 10/01/2012 10:53 AM Beeper [phone]   Patient seen and examined  Theresa have amended above consult note to reflect my findings. Plans as noted  Dietrich Pates 10/01/2012

## 2012-10-01 NOTE — Progress Notes (Signed)
UR Completed Yazmeen Woolf Graves-Bigelow, RN,BSN [phone]  

## 2012-10-02 ENCOUNTER — Observation Stay (HOSPITAL_COMMUNITY): Payer: Medicare Other

## 2012-10-02 ENCOUNTER — Other Ambulatory Visit (HOSPITAL_COMMUNITY): Payer: Medicare Other

## 2012-10-02 ENCOUNTER — Encounter (HOSPITAL_COMMUNITY): Payer: Self-pay | Admitting: Radiology

## 2012-10-02 DIAGNOSIS — K219 Gastro-esophageal reflux disease without esophagitis: Secondary | ICD-10-CM

## 2012-10-02 DIAGNOSIS — R079 Chest pain, unspecified: Secondary | ICD-10-CM

## 2012-10-02 LAB — CBC WITH DIFFERENTIAL/PLATELET
Basophils Relative: 1 % (ref 0–1)
Eosinophils Absolute: 0.1 10*3/uL (ref 0.0–0.7)
Eosinophils Relative: 2 % (ref 0–5)
HCT: 34.4 % — ABNORMAL LOW (ref 36.0–46.0)
Lymphs Abs: 2.8 10*3/uL (ref 0.7–4.0)
Monocytes Absolute: 0.4 10*3/uL (ref 0.1–1.0)
Monocytes Relative: 10 % (ref 3–12)
Neutrophils Relative %: 25 % — ABNORMAL LOW (ref 43–77)
RDW: 15.1 % (ref 11.5–15.5)
WBC: 4.6 10*3/uL (ref 4.0–10.5)

## 2012-10-02 LAB — COMPREHENSIVE METABOLIC PANEL
Albumin: 3.5 g/dL (ref 3.5–5.2)
Alkaline Phosphatase: 45 U/L (ref 39–117)
BUN: 20 mg/dL (ref 6–23)
Calcium: 9.4 mg/dL (ref 8.4–10.5)
Creatinine, Ser: 0.77 mg/dL (ref 0.50–1.10)
GFR calc Af Amer: 89 mL/min — ABNORMAL LOW (ref >90)
Glucose, Bld: 87 mg/dL (ref 70–99)
Total Protein: 7.3 g/dL (ref 6.0–8.3)

## 2012-10-02 LAB — MAGNESIUM: Magnesium: 2 mg/dL (ref 1.5–2.5)

## 2012-10-02 MED ORDER — PANTOPRAZOLE SODIUM 40 MG PO TBEC: 40.0000 mg | DELAYED_RELEASE_TABLET | Freq: Every day | ORAL | Status: DC

## 2012-10-02 MED ORDER — TECHNETIUM TC 99M SESTAMIBI - CARDIOLITE
30.0000 | Freq: Once | INTRAVENOUS | Status: AC | PRN
  Administered 2012-10-02: 09:00:00 30 via INTRAVENOUS

## 2012-10-02 MED ORDER — CLOPIDOGREL BISULFATE 75 MG PO TABS: 75.0000 mg | ORAL_TABLET | Freq: Every day | ORAL | Status: DC

## 2012-10-02 MED ORDER — TECHNETIUM TC 99M SESTAMIBI - CARDIOLITE
10.0000 | Freq: Once | INTRAVENOUS | Status: AC | PRN
  Administered 2012-10-02: 10 via INTRAVENOUS

## 2012-10-02 NOTE — Discharge Summary (Signed)
Physician Discharge Summary  Theresa Bauer AOZ:[id number] DOB: [birth year] DOA: 09/30/2012  PCP: Dorrene German, MD  Admit date: 09/30/2012 Discharge date: 10/02/2012  Time spent: 40  minutes  Recommendations for Outpatient Follow-up:  1. Follow up with primary MD.   Discharge Diagnoses:  Active Problems:   HYPERTENSION   VENOUS INSUFFICIENCY, CHRONIC   CHEST PAIN   Osteoporosis, unspecified   Branch retinal artery occlusion, left eye   Discharge Condition: Satisfactory.   Diet recommendation: Heart-Healthy.   Filed Weights   09/30/12 1638 10/01/12 0501 10/02/12 0510  Weight: 69.7 kg (153 lb 10.6 oz) 68.8 kg (151 lb 10.8 oz) 69.8 kg (153 lb 14.1 oz)    History of present illness:  79 y.o. BF with history of cataracts,(per patient's family) left eye stroke (diagnosed June 2014 Dr. Mateo Flow her ophthalmologist), HTN, chronic venous insufficiency, OA, s/p bilateral TKA 2007/2008, GERD, presenting with c/o of chest pain of 3 days duration. States first episode woke her from sleep, substernal, described as heavy pressure on her chest, positive radiation down left arm, positive SOB, negative N./V. pain lasted for approximately 10 minutes and then resolved. The next day patient experienced an episode of positive left arm pain, negative SOB, negative N./V. pain resolved in approximately 10 minutes. Also states has been having increasing bilateral lower calf pain. Reports falling recently- last fall 3 weeks ago, no prior weakness or dizziness, no loss of consciousness. Admitted for further management.    Hospital Course:  1. Chest pain: Patient presented with recurrent central chest pain, radiating to her left arm, over 3 days. 12-Lead EKG showed no acute ischemic changes, cardiac enzymes remained unelevated, and CXR/CTA show no acute findings. Given history and risk factors, cardiology consultation was requested, and provided by Dr Dietrich Pates. Patient is s/p negative stress Myoview  10/02/12, which showed no definite inducible or reversible ischemia with pharmacologic stress. No fixed defects, grossly normal wall motion and thickening. Calculated Q G S ejection fraction of 74%. Patient had no recurrence of chest pain, during her hospitalization. This is likely non-cardiac. PPI prescribed. 2. Left Retinal artery occlusion: This ocurred in 07/2012, diagnosed by Dr Marena Chancy, ophthalmologist. Allergic to ASA. Carotid duplex revealed1-39% ICA stenosis bilaterally. Vertebral artery flow was antegrade. Discharged on Plavix.  3. HTN: Controlled on pre-admission antihypertensive medication.  4. Chronic venous insufficiency: Patient does occasionally complain of LE cramps. She has no obvious LE swelling or tenderness. Venous dopplers showed no evidence of DVT, superficial thrombosis, or Baker's Cyst, bilaterally. Reassured accordingly.    Procedures: See Below.  Stress Myoview on 10/02/12.  Vascular dopplers 10/01/12.   Consultations:  Dr Dietrich Pates, cardiologist.   Discharge Exam: Filed Vitals:   10/02/12 1406  BP: 119/50  Pulse: 74  Temp: 98.6 F (37 C)  Resp: 17    General: Comfortable, alert, communicative, fully oriented, not short of breath at rest.  HEENT: No clinical pallor, no jaundice, no conjunctival injection or discharge. Hydration is fair.  NECK: Supple, JVP not seen, no carotid bruits, no palpable lymphadenopathy, no palpable goiter.  CHEST: Clinically clear to auscultation, no wheezes, no crackles.  HEART: Sounds 1 and 2 heard, normal, regular, no murmurs.  ABDOMEN: Full, soft, non-tender, no palpable organomegaly, no palpable masses, normal bowel sounds.  GENITALIA: Not examined.  LOWER EXTREMITIES: No pitting edema, palpable peripheral pulses.  MUSCULOSKELETAL SYSTEM: Generalized osteoarthritic changes, otherwise, normal.  CENTRAL NERVOUS SYSTEM: No focal neurologic deficit on gross examination.  Discharge Instructions  Discharge Orders    Future Appointments Provider Department Dept Phone   10/05/2012 11:45 AM Jannette Spanner [facility] [phone]   10/07/2012 11:45 AM Elizbeth Squires, PT [facility] [phone]   06/07/2013 9:00 AM Sherrie George, MD [facility] [phone]   Future Orders Complete By Expires   Diet - low sodium heart healthy  As directed    Increase activity slowly  As directed        Medication List    STOP taking these medications       FISH OIL PO      TAKE these medications       acetaminophen-codeine 300-30 MG per tablet  Commonly known as:  TYLENOL #3  Take 1 tablet by mouth 2 (two) times daily as needed. For pain     alendronate 70 MG tablet  Commonly known as:  FOSAMAX  Take 70 mg by mouth every 7 (seven) days. Take with a full glass of water on an empty stomach.     amLODipine 5 MG tablet  Commonly known as:  NORVASC  Take 5 mg by mouth daily.     clopidogrel 75 MG tablet  Commonly known as:  PLAVIX  Take 1 tablet (75 mg total) by mouth daily.     hydrochlorothiazide 12.5 MG tablet  Commonly known as:  HYDRODIURIL  Take 12.5 mg by mouth daily.     loratadine 10 MG tablet  Commonly known as:  CLARITIN  Take 10 mg by mouth daily.     multivitamins ther. w/minerals Tabs tablet  Take 1 tablet by mouth daily.     pantoprazole 40 MG tablet  Commonly known as:  PROTONIX  Take 1 tablet (40 mg total) by mouth daily.     ranitidine 150 MG tablet  Commonly known as:  ZANTAC  Take 150 mg by mouth daily.     Travoprost (BAK Free) 0.004 % Soln ophthalmic solution  Commonly known as:  TRAVATAN  Place 1 drop into both eyes at bedtime.       Allergies  Allergen Reactions  . Lisinopril Anaphylaxis  . Darvocet [Propoxyphene-Acetaminophen] Itching  . Aspirin     itching  . Penicillins     itching   Follow-up Information   Follow up with Dorrene German, MD.   Specialty:  Internal  Medicine   Contact information:   [address] [place] [address] [phone]        The results of significant diagnostics from this hospitalization (including imaging, microbiology, ancillary and laboratory) are listed below for reference.    Significant Diagnostic Studies: Dg Chest 2 View  09/30/2012   *RADIOLOGY REPORT*  Clinical Data: Shortness of breath.  Weakness.  CHEST - 2 VIEW  Comparison: Plain film chest 02/16/2010 and CT chest 03/18/2007.  Findings: There is mild cardiomegaly.  Lungs are clear.  No pneumothorax or pleural effusion.  IMPRESSION: Mild cardiomegaly without acute disease.   Original Report Authenticated By: Holley Dexter, M.D.   Ct Angio Chest Pe W/cm &/or Wo Cm  10/01/2012   *RADIOLOGY REPORT*  Clinical Data: Recent CVA, now with chest pain and shortness of breath, evaluate for pulmonary embolism  CT ANGIOGRAPHY CHEST  Technique:  Multidetector CT imaging of the chest using the standard protocol during bolus administration of intravenous contrast. Multiplanar reconstructed images including MIPs were obtained and reviewed to evaluate the vascular anatomy.  Contrast: OMNIPAQUE IOHEXOL 350 MG/ML SOLN  Comparison: Chest  radiograph - earlier same day; chest CT - 03/18/2007  Vascular Findings:  There is adequate opacification of the pulmonary arterial system of the main pulmonary artery measuring 280 HU.  There are no filling defects within the pulmonary arterial tree to suggest pulmonary embolism.  Normal caliber of the main pulmonary artery.  Borderline cardiomegaly.  No pericardial effusion.  Normal caliber of the thoracic aorta.  Atherosclerotic calcifications within the undersurface of the aortic arch.  No definite thoracic aortic dissection or periaortic stranding. Conventional configuration of the aortic arch.  ----------------------------------------------------------------  Nonvascular findings:  There is minimal subpleural ground-glass atelectasis.   No focal airspace opacities.  No pleural effusion or pneumothorax.  The central pulmonary airways are patent.  Linear approximately 6 mm subpleural nodule within the right upper lobe (image 42, series six) is unchanged.  No new pulmonary nodules.  No mediastinal, hilar or axillary lymphadenopathy.  Grossly unchanged approximately 1.3 cm hypoattenuating (7 HU) cyst within the dome of the left lobe liver (image 65, series four).  No acute or aggressive osseous abnormalities.  Mild scoliotic curvature of the thoracic spine, convex to the right, possibly positional.  IMPRESSION:  1.  No acute cardiopulmonary disease.  Specifically, no evidence of pulmonary embolism. 2.  Unchanged approximately 6 mm right upper lobe nodule, stable since the 2009 examination and thus of benign etiology.   Original Report Authenticated By: Tacey Ruiz, MD   Nm Myocar Multi W/spect W/wall Motion / Ef  10/02/2012   *RADIOLOGY REPORT*  Clinical Data:  Chest pain  MYOCARDIAL IMAGING WITH SPECT (REST AND PHARMACOLOGIC-STRESS) GATED LEFT VENTRICULAR WALL MOTION STUDY LEFT VENTRICULAR EJECTION FRACTION  Technique:  Standard myocardial SPECT imaging was performed after resting intravenous injection of 10 mCi Tc-39m sestamibi. Subsequently, intravenous infusion of Lexiscan was performed under the supervision of the Cardiology staff.  At peak effect of the drug, 30 mCi Tc-33m sestamibi was injected intravenously and standard myocardial SPECT imaging was performed.  Quantitative gated imaging was also performed to evaluate left ventricular wall motion and estimate left ventricular ejection fraction.  Comparison:  None.  Findings:  No definite inducible or reversible ischemia with pharmacologic stress.  No fixed defects.  Wall motion:  Grossly normal wall motion and thickening  Ejection fraction:  Calculated Q G S ejection fraction is 74%  IMPRESSION: Negative for inducible ischemia.   Original Report Authenticated By: Judie Petit. Miles Costain, M.D.     Microbiology: No results found for this or any previous visit (from the past 240 hour(s)).   Labs: Basic Metabolic Panel:  Recent Labs Lab 09/30/12 1305 09/30/12 1748 10/01/12 0635 10/02/12 0545  NA 140 141 140 140  K 3.6 3.5 3.6 3.8  CL 101 102 102 102  CO2 29 28 27 28   GLUCOSE 89 79 88 87  BUN 14 12 14 20   CREATININE 0.72 0.66 0.78 0.77  CALCIUM 9.8 9.8 9.2 9.4  MG  --  2.1 2.0 2.0   Liver Function Tests:  Recent Labs Lab 09/30/12 1748 10/01/12 0635 10/02/12 0545  AST 17 15 14   ALT 9 7 7   ALKPHOS 52 44 45  BILITOT 0.5 0.5 0.4  PROT 7.9 6.8 7.3  ALBUMIN 4.0 3.3* 3.5   No results found for this basename: LIPASE, AMYLASE,  in the last 168 hours No results found for this basename: AMMONIA,  in the last 168 hours CBC:  Recent Labs Lab 09/30/12 1305 09/30/12 1748 10/01/12 0635 10/02/12 0545  WBC 5.3 4.6 4.0 4.6  NEUTROABS  --  1.3* 1.1* 1.1*  HGB 12.0 12.4 11.1* 11.6*  HCT 34.6* 36.5 33.3* 34.4*  MCV 69.9* 70.1* 70.0* 70.1*  PLT 213 196 195 203   Cardiac Enzymes:  Recent Labs Lab 09/30/12 1748 09/30/12 2248 10/01/12 0635  TROPONINI <0.30 <0.30 <0.30   BNP: BNP (last 3 results)  Recent Labs  09/30/12 1748  PROBNP 142.7   CBG: No results found for this basename: GLUCAP,  in the last 168 hours     Signed:  Aiven Kampe,CHRISTOPHER  [facility] 10/02/2012, 4:15 PM

## 2012-10-02 NOTE — Progress Notes (Signed)
Patient Name: Theresa Bauer      SUBJECTIVE without hx of chestpain admitted with same 2/2 multiple risk factors>> stress test ordered  Past Medical History  Diagnosis Date  . Hypertension   . GERD (gastroesophageal reflux disease)     Scheduled Meds:  Scheduled Meds: . amLODipine  5 mg Oral Daily  . enoxaparin (LOVENOX) injection  40 mg Subcutaneous Q24H  . famotidine  20 mg Oral Daily  . hydrochlorothiazide  12.5 mg Oral Daily  . latanoprost  1 drop Both Eyes QHS  . loratadine  10 mg Oral Daily  . multivitamin with minerals  1 tablet Oral Daily  . regadenoson  0.4 mg Intravenous Once   Continuous Infusions: . sodium chloride 50 mL/hr at 09/30/12 1953    PHYSICAL EXAM Filed Vitals:   10/01/12 2054 10/02/12 0510 10/02/12 0525 10/02/12 0906  BP: 134/55  173/65 171/76  Pulse: 80  73 66  Temp: 98.6 F (37 C)  98.9 F (37.2 C)   TempSrc: Oral  Oral   Resp: 18  18   Weight:  153 lb 14.1 oz (69.8 kg)    SpO2: 100%  100%    pe per DD   Intake/Output Summary (Last 24 hours) at 10/02/12 0908 Last data filed at 10/01/12 1730  Gross per 24 hour  Intake    480 ml  Output    400 ml  Net     80 ml    LABS: Basic Metabolic Panel:  Recent Labs Lab 09/30/12 1305  09/30/12 1748 10/01/12 0635 10/02/12 0545  NA 140  --  141 140 140  K 3.6  --  3.5 3.6 3.8  CL 101  --  102 102 102  CO2 29  --  28 27 28   GLUCOSE 89  --  79 88 87  BUN 14  --  12 14 20   CREATININE 0.72  --  0.66 0.78 0.77  CALCIUM 9.8  --  9.8 9.2 9.4  MG  --   < > 2.1 2.0 2.0  < > = values in this interval not displayed. Cardiac Enzymes:  Recent Labs  09/30/12 1748 09/30/12 2248 10/01/12 0635  TROPONINI <0.30 <0.30 <0.30   CBC:  Recent Labs Lab 09/30/12 1305 09/30/12 1748 10/01/12 0635 10/02/12 0545  WBC 5.3 4.6 4.0 4.6  NEUTROABS  --  1.3* 1.1* 1.1*  HGB 12.0 12.4 11.1* 11.6*  HCT 34.6* 36.5 33.3* 34.4*  MCV 69.9* 70.1* 70.0* 70.1*  PLT 213 196 195 203   PROTIME:  Recent  Labs  09/30/12 1748  LABPROT 12.3  INR 0.93   Liver Function Tests:  Recent Labs  10/01/12 0635 10/02/12 0545  AST 15 14  ALT 7 7  ALKPHOS 44 45  BILITOT 0.5 0.4  PROT 6.8 7.3  ALBUMIN 3.3* 3.5   No results found for this basename: LIPASE, AMYLASE,  in the last 72 hours BNP: BNP (last 3 results)  Recent Labs  09/30/12 1748  PROBNP 142.7   D-Dimer: No results found for this basename: DDIMER,  in the last 72 hours Hemoglobin A1C:  Recent Labs  09/30/12 1748  HGBA1C 5.7*   Fasting Lipid Panel: No results found for this basename: CHOL, HDL, LDLCALC, TRIG, CHOLHDL, LDLDIRECT,  in the last 72 hours Thyroid Function Tests:  Recent Labs  09/30/12 1748  TSH 1.421   Anemia Panel: No results found for this basename: VITAMINB12, FOLATE, FERRITIN, TIBC, IRON, RETICCTPCT,  in the last 72 hours  ASSESSMENT AND PLAN:  Active Problems:   HYPERTENSION   VENOUS INSUFFICIENCY, CHRONIC   CHEST PAIN   Osteoporosis, unspecified   Branch retinal artery occlusion, left eye    Signed, Sherryl Manges MD  10/02/2012

## 2012-10-02 NOTE — Progress Notes (Signed)
Lexiscan cardiolite completed. Pt had occ PVCs/trigeminy during/after test. Was able to feel this as a palpitation. Transient SOB during study but no CP. Await images [employer] PA-C

## 2012-10-04 NOTE — ED Provider Notes (Signed)
I saw and evaluated the patient, reviewed the resident's note and I agree with the findings and plan.  Toy Baker, MD 10/04/12 2059

## 2012-10-05 ENCOUNTER — Encounter: Payer: Medicare Other | Admitting: Rehabilitation

## 2012-10-07 ENCOUNTER — Encounter: Payer: Medicare Other | Admitting: Physical Therapy

## 2013-01-14 ENCOUNTER — Ambulatory Visit
Admission: RE | Admit: 2013-01-14 | Discharge: 2013-01-14 | Disposition: A | Discharge status: Home / Self Care | Payer: Medicare Other | Source: Ambulatory Visit | Attending: General Practice | Admitting: General Practice

## 2013-01-14 ENCOUNTER — Other Ambulatory Visit: Payer: Self-pay | Admitting: General Practice

## 2013-01-14 DIAGNOSIS — R52 Pain, unspecified: Secondary | ICD-10-CM

## 2013-03-12 ENCOUNTER — Emergency Department (INDEPENDENT_AMBULATORY_CARE_PROVIDER_SITE_OTHER): Payer: Medicare Other

## 2013-03-12 ENCOUNTER — Encounter (HOSPITAL_COMMUNITY): Payer: Self-pay | Admitting: Emergency Medicine

## 2013-03-12 ENCOUNTER — Emergency Department (INDEPENDENT_AMBULATORY_CARE_PROVIDER_SITE_OTHER)
Admission: EM | Admit: 2013-03-12 | Discharge: 2013-03-12 | Disposition: A | Discharge status: Home / Self Care | Payer: Medicare Other | Source: Home / Self Care | Attending: Family Medicine | Admitting: Family Medicine

## 2013-03-12 DIAGNOSIS — M545 Low back pain, unspecified: Secondary | ICD-10-CM

## 2013-03-12 LAB — POCT URINALYSIS DIP (DEVICE)
Bilirubin Urine: NEGATIVE
GLUCOSE, UA: NEGATIVE mg/dL
Ketones, ur: NEGATIVE mg/dL
Leukocytes, UA: NEGATIVE
NITRITE: NEGATIVE
Protein, ur: NEGATIVE mg/dL
Specific Gravity, Urine: 1.015 (ref 1.005–1.030)
UROBILINOGEN UA: 0.2 mg/dL (ref 0.0–1.0)
pH: 7 (ref 5.0–8.0)

## 2013-03-12 MED ORDER — HYDROCODONE-ACETAMINOPHEN 5-325 MG PO TABS: 1.0000 | ORAL_TABLET | Freq: Three times a day (TID) | ORAL | Status: DC | PRN

## 2013-03-12 NOTE — ED Notes (Signed)
Pt c/o right flank/back pain onset 2 days that's gradually getting worse Reports pain is constant and increases w/activity and when she bends over Also c/o urinary freq Denies abd pain, fevers, inj/trauma, dysuria Alert w/no signs of acute distress.

## 2013-03-12 NOTE — ED Provider Notes (Signed)
Theresa Bauer is a 80 y.o. female who presents to Urgent Care today for right back pain. Pain present in right low back for 2 days. No injury. No radiating pain, weakness or numbness. Pain moderate to severe and worse with activity. No bowel bladder dysfunction. Patient has tried Tylenol #3 which have not been helpful. She feels well otherwise.   Past Medical History  Diagnosis Date  . Hypertension   . GERD (gastroesophageal reflux disease)    History  Substance Use Topics  . Smoking status: Never Smoker   . Smokeless tobacco: Not on file  . Alcohol Use: No   ROS as above Medications: No current facility-administered medications for this encounter.   Current Outpatient Prescriptions  Medication Sig Dispense Refill  . acetaminophen-codeine (TYLENOL #3) 300-30 MG per tablet Take 1 tablet by mouth 2 (two) times daily as needed. For pain      . alendronate (FOSAMAX) 70 MG tablet Take 70 mg by mouth every 7 (seven) days. Take with a full glass of water on an empty stomach.      Marland Kitchen amLODipine (NORVASC) 5 MG tablet Take 5 mg by mouth daily.      . clopidogrel (PLAVIX) 75 MG tablet Take 1 tablet (75 mg total) by mouth daily.  30 tablet  0  . hydrochlorothiazide (HYDRODIURIL) 12.5 MG tablet Take 12.5 mg by mouth daily.      Marland Kitchen HYDROcodone-acetaminophen (NORCO/VICODIN) 5-325 MG per tablet Take 1 tablet by mouth every 8 (eight) hours as needed.  15 tablet  0  . loratadine (CLARITIN) 10 MG tablet Take 10 mg by mouth daily.      . Multiple Vitamins-Minerals (MULTIVITAMINS THER. W/MINERALS) TABS tablet Take 1 tablet by mouth daily.      . pantoprazole (PROTONIX) 40 MG tablet Take 1 tablet (40 mg total) by mouth daily.  30 tablet  0  . ranitidine (ZANTAC) 150 MG tablet Take 150 mg by mouth daily.      . Travoprost, BAK Free, (TRAVATAN) 0.004 % SOLN ophthalmic solution Place 1 drop into both eyes at bedtime.        Exam:  BP 170/64  Pulse 82  Temp(Src) 98.5 F (36.9 C) (Oral)  Resp 25  SpO2  100% Gen: Well NAD BACK: Nontender to spinal midline. Tender palpation right SI joint and along the right iliac crest. Decreased back range of motion secondary to pain. Lower extremity strength is intact. Reflexes are diminished but equal bilaterally. Patient has intact strength. She can stand up from wheelchair and transition.  Results for orders placed during the hospital encounter of 03/12/13 (from the past 24 hour(s))  POCT URINALYSIS DIP (DEVICE)     Status: Abnormal   Collection Time    03/12/13  3:35 PM      Result Value Range   Glucose, UA NEGATIVE  NEGATIVE mg/dL   Bilirubin Urine NEGATIVE  NEGATIVE   Ketones, ur NEGATIVE  NEGATIVE mg/dL   Specific Gravity, Urine 1.015  1.005 - 1.030   Hgb urine dipstick TRACE (*) NEGATIVE   pH 7.0  5.0 - 8.0   Protein, ur NEGATIVE  NEGATIVE mg/dL   Urobilinogen, UA 0.2  0.0 - 1.0 mg/dL   Nitrite NEGATIVE  NEGATIVE   Leukocytes, UA NEGATIVE  NEGATIVE   Dg Pelvis 1-2 Views  03/12/2013   CLINICAL DATA:  Right-sided pelvic pain  EXAM: PELVIS - 1-2 VIEW  COMPARISON:  10/08/2011  FINDINGS: No acute fracture is noted. The pelvic ring is intact.  Degenerative changes of sacroiliac joints and lower lumbar spine are noted but stable in appearance from the prior exam.  IMPRESSION: No acute abnormality is noted.   Electronically Signed   By: Alcide CleverMark  Lukens M.D.   On: 03/12/2013 15:20    Assessment and Plan: 80 y.o. female with lumbago.  This is due to myofascial dysfunction. Plan to treat with Norco heating pad home exercise program and physical therapy.  Followup as needed  Discussed warning signs or symptoms. Please see discharge instructions. Patient expresses understanding.    Rodolph BongEvan S Bobi Daudelin, MD 03/12/13 [phone]

## 2013-03-12 NOTE — Discharge Instructions (Signed)
Thank you for coming in today. Take norco for severe pain.  Use a heating pad.  Come back or go to the emergency room if you notice new weakness new numbness problems walking or bowel or bladder problems. Call Physical Therapy Monday to set up an appointment.  [phone]

## 2013-06-07 ENCOUNTER — Ambulatory Visit (INDEPENDENT_AMBULATORY_CARE_PROVIDER_SITE_OTHER): Payer: Medicare Other | Admitting: Ophthalmology

## 2013-06-10 ENCOUNTER — Ambulatory Visit (INDEPENDENT_AMBULATORY_CARE_PROVIDER_SITE_OTHER): Payer: PRIVATE HEALTH INSURANCE | Admitting: Ophthalmology

## 2013-06-10 DIAGNOSIS — H348192 Central retinal vein occlusion, unspecified eye, stable: Secondary | ICD-10-CM

## 2013-06-10 DIAGNOSIS — I10 Essential (primary) hypertension: Secondary | ICD-10-CM

## 2013-06-10 DIAGNOSIS — H35379 Puckering of macula, unspecified eye: Secondary | ICD-10-CM

## 2013-06-10 DIAGNOSIS — H43819 Vitreous degeneration, unspecified eye: Secondary | ICD-10-CM

## 2013-06-10 DIAGNOSIS — H35039 Hypertensive retinopathy, unspecified eye: Secondary | ICD-10-CM

## 2013-08-23 ENCOUNTER — Other Ambulatory Visit (HOSPITAL_COMMUNITY): Payer: Self-pay | Admitting: Internal Medicine

## 2013-08-23 ENCOUNTER — Ambulatory Visit (HOSPITAL_COMMUNITY)
Admission: RE | Admit: 2013-08-23 | Discharge: 2013-08-23 | Disposition: A | Discharge status: Home / Self Care | Payer: Medicare Other | Source: Ambulatory Visit | Attending: Internal Medicine | Admitting: Internal Medicine

## 2013-08-23 DIAGNOSIS — M7989 Other specified soft tissue disorders: Secondary | ICD-10-CM | POA: Diagnosis not present

## 2013-08-23 DIAGNOSIS — M79609 Pain in unspecified limb: Secondary | ICD-10-CM

## 2013-08-23 NOTE — Progress Notes (Signed)
*  PRELIMINARY RESULTS* Vascular Ultrasound Urgent ADD ON STUDY Left lower extremity venous duplex has been completed.  Preliminary findings: Left:  No evidence of DVT, superficial thrombosis, or Baker's cyst.  Attempted call report to Dr. Marliss CootsAvubuere for urgent study results. Office is closed for lunch. Since no one is available, will let patient leave.  Farrel DemarkJill Eunice, RDMS, RVT  08/23/2013, 1:44 PM

## 2013-08-29 ENCOUNTER — Other Ambulatory Visit (HOSPITAL_COMMUNITY): Payer: Self-pay | Admitting: Internal Medicine

## 2013-08-29 ENCOUNTER — Ambulatory Visit (HOSPITAL_COMMUNITY)
Admission: RE | Admit: 2013-08-29 | Discharge: 2013-08-29 | Disposition: A | Discharge status: Home / Self Care | Payer: Medicare Other | Source: Ambulatory Visit | Attending: Internal Medicine | Admitting: Internal Medicine

## 2013-08-29 DIAGNOSIS — R079 Chest pain, unspecified: Secondary | ICD-10-CM | POA: Insufficient documentation

## 2013-08-29 DIAGNOSIS — M47814 Spondylosis without myelopathy or radiculopathy, thoracic region: Secondary | ICD-10-CM | POA: Insufficient documentation

## 2013-08-29 DIAGNOSIS — R0789 Other chest pain: Secondary | ICD-10-CM

## 2013-08-29 DIAGNOSIS — J9819 Other pulmonary collapse: Secondary | ICD-10-CM | POA: Insufficient documentation

## 2013-09-19 ENCOUNTER — Other Ambulatory Visit (HOSPITAL_COMMUNITY): Payer: Self-pay | Admitting: Internal Medicine

## 2013-09-19 DIAGNOSIS — I509 Heart failure, unspecified: Principal | ICD-10-CM

## 2013-09-19 DIAGNOSIS — I110 Hypertensive heart disease with heart failure: Secondary | ICD-10-CM

## 2013-09-20 ENCOUNTER — Ambulatory Visit (HOSPITAL_COMMUNITY)
Admission: RE | Admit: 2013-09-20 | Discharge: 2013-09-20 | Disposition: A | Discharge status: Home / Self Care | Payer: Medicare Other | Source: Ambulatory Visit | Attending: Internal Medicine | Admitting: Internal Medicine

## 2013-09-20 ENCOUNTER — Ambulatory Visit (HOSPITAL_COMMUNITY): Payer: Medicare Other

## 2013-09-20 DIAGNOSIS — I517 Cardiomegaly: Secondary | ICD-10-CM

## 2013-09-20 DIAGNOSIS — R072 Precordial pain: Secondary | ICD-10-CM | POA: Diagnosis not present

## 2013-09-20 DIAGNOSIS — R0989 Other specified symptoms and signs involving the circulatory and respiratory systems: Secondary | ICD-10-CM

## 2013-09-20 DIAGNOSIS — I110 Hypertensive heart disease with heart failure: Secondary | ICD-10-CM

## 2013-09-20 DIAGNOSIS — R0609 Other forms of dyspnea: Secondary | ICD-10-CM | POA: Insufficient documentation

## 2013-09-20 DIAGNOSIS — I509 Heart failure, unspecified: Secondary | ICD-10-CM

## 2013-09-20 NOTE — Progress Notes (Signed)
Echocardiogram 2D Echocardiogram has been performed.  Dorothey BasemanReel, Tavian Callander M 09/20/2013, 12:15 PM

## 2013-10-20 ENCOUNTER — Emergency Department (HOSPITAL_COMMUNITY)
Admission: EM | Admit: 2013-10-20 | Discharge: 2013-10-20 | Disposition: A | Discharge status: Home / Self Care | Payer: Medicare Other | Attending: Emergency Medicine | Admitting: Emergency Medicine

## 2013-10-20 ENCOUNTER — Encounter (HOSPITAL_COMMUNITY): Payer: Self-pay | Admitting: Emergency Medicine

## 2013-10-20 DIAGNOSIS — Z880 Allergy status to penicillin: Secondary | ICD-10-CM | POA: Diagnosis not present

## 2013-10-20 DIAGNOSIS — K5900 Constipation, unspecified: Secondary | ICD-10-CM | POA: Diagnosis not present

## 2013-10-20 DIAGNOSIS — Z79899 Other long term (current) drug therapy: Secondary | ICD-10-CM | POA: Insufficient documentation

## 2013-10-20 DIAGNOSIS — R109 Unspecified abdominal pain: Secondary | ICD-10-CM | POA: Insufficient documentation

## 2013-10-20 DIAGNOSIS — I10 Essential (primary) hypertension: Secondary | ICD-10-CM | POA: Diagnosis not present

## 2013-10-20 DIAGNOSIS — K219 Gastro-esophageal reflux disease without esophagitis: Secondary | ICD-10-CM | POA: Insufficient documentation

## 2013-10-20 DIAGNOSIS — Z7901 Long term (current) use of anticoagulants: Secondary | ICD-10-CM | POA: Diagnosis not present

## 2013-10-20 LAB — COMPREHENSIVE METABOLIC PANEL
ALBUMIN: 3.8 g/dL (ref 3.5–5.2)
ALT: 9 U/L (ref 0–35)
AST: 21 U/L (ref 0–37)
Alkaline Phosphatase: 62 U/L (ref 39–117)
Anion gap: 12 (ref 5–15)
BUN: 11 mg/dL (ref 6–23)
CALCIUM: 9.5 mg/dL (ref 8.4–10.5)
CO2: 28 meq/L (ref 19–32)
Chloride: 100 mEq/L (ref 96–112)
Creatinine, Ser: 0.72 mg/dL (ref 0.50–1.10)
GFR calc Af Amer: >90 mL/min (ref >90)
GFR, EST NON AFRICAN AMERICAN: 78 mL/min — AB (ref >90)
Glucose, Bld: 118 mg/dL — ABNORMAL HIGH (ref 70–99)
Potassium: 3.6 mEq/L — ABNORMAL LOW (ref 3.7–5.3)
SODIUM: 140 meq/L (ref 137–147)
Total Bilirubin: 0.6 mg/dL (ref 0.3–1.2)
Total Protein: 7.9 g/dL (ref 6.0–8.3)

## 2013-10-20 LAB — POC OCCULT BLOOD, ED: FECAL OCCULT BLD: NEGATIVE

## 2013-10-20 LAB — CBC WITH DIFFERENTIAL/PLATELET
Basophils Absolute: 0 10*3/uL (ref 0.0–0.1)
Basophils Relative: 1 % (ref 0–1)
EOS PCT: 2 % (ref 0–5)
Eosinophils Absolute: 0.1 10*3/uL (ref 0.0–0.7)
HCT: 32.8 % — ABNORMAL LOW (ref 36.0–46.0)
Hemoglobin: 10.9 g/dL — ABNORMAL LOW (ref 12.0–15.0)
Lymphocytes Relative: 49 % — ABNORMAL HIGH (ref 12–46)
Lymphs Abs: 2 10*3/uL (ref 0.7–4.0)
MCH: 22.9 pg — ABNORMAL LOW (ref 26.0–34.0)
MCHC: 33.2 g/dL (ref 30.0–36.0)
MCV: 68.9 fL — ABNORMAL LOW (ref 78.0–100.0)
MONO ABS: 0.5 10*3/uL (ref 0.1–1.0)
MONOS PCT: 11 % (ref 3–12)
NEUTROS PCT: 37 % — AB (ref 43–77)
Neutro Abs: 1.5 10*3/uL — ABNORMAL LOW (ref 1.7–7.7)
PLATELETS: 260 10*3/uL (ref 150–400)
RBC: 4.76 MIL/uL (ref 3.87–5.11)
RDW: 16.1 % — ABNORMAL HIGH (ref 11.5–15.5)
WBC: 4.1 10*3/uL (ref 4.0–10.5)

## 2013-10-20 LAB — URINALYSIS, ROUTINE W REFLEX MICROSCOPIC
Bilirubin Urine: NEGATIVE
GLUCOSE, UA: NEGATIVE mg/dL
HGB URINE DIPSTICK: NEGATIVE
Ketones, ur: NEGATIVE mg/dL
Leukocytes, UA: NEGATIVE
Nitrite: NEGATIVE
PH: 6.5 (ref 5.0–8.0)
PROTEIN: NEGATIVE mg/dL
SPECIFIC GRAVITY, URINE: 1.007 (ref 1.005–1.030)
Urobilinogen, UA: 1 mg/dL (ref 0.0–1.0)

## 2013-10-20 NOTE — Discharge Instructions (Signed)
Use MiraLax, twice a day until you are having daily soft bowel movements. After that use the MiraLax once a day for about a week. Drink plenty of fluids, especially water. Try to increase the amount of fiber in your diet. You can try using Colace, twice a day, after you are finished with the MiraLax, which is useful to prevent constipation when you are taking narcotic pain relievers.   Constipation Constipation is when a person:  Poops (has a bowel movement) less than 3 times a week.  Has a hard time pooping.  Has poop that is dry, hard, or bigger than normal. HOME CARE   Eat foods with a lot of fiber in them. This includes fruits, vegetables, beans, and whole grains such as brown rice.  Avoid fatty foods and foods with a lot of sugar. This includes french fries, hamburgers, cookies, candy, and soda.  If you are not getting enough fiber from food, take products with added fiber in them (supplements).  Drink enough fluid to keep your pee (urine) clear or pale yellow.  Exercise on a regular basis, or as told by your doctor.  Go to the restroom when you feel like you need to poop. Do not hold it.  Only take medicine as told by your doctor. Do not take medicines that help you poop (laxatives) without talking to your doctor first. GET HELP RIGHT AWAY IF:   You have bright red blood in your poop (stool).  Your constipation lasts more than 4 days or gets worse.  You have belly (abdominal) or butt (rectal) pain.  You have thin poop (as thin as a pencil).  You lose weight, and it cannot be explained. MAKE SURE YOU:   Understand these instructions.  Will watch your condition.  Will get help right away if you are not doing well or get worse. Document Released: 07/09/2007 Document Revised: 01/25/2013 Document Reviewed: 11/01/2012 [facility] Patient Information 2015 [place], [place]. This information is not intended to replace advice given to you by your health care provider. Make  sure you discuss any questions you have with your health care provider.

## 2013-10-20 NOTE — ED Notes (Signed)
Patient does not have the urge to urinate right now. Will recheck in fifthteen minutes.

## 2013-10-20 NOTE — ED Notes (Signed)
MD at bedside. 

## 2013-10-20 NOTE — ED Provider Notes (Signed)
CSN: [id number]     Arrival date & time 10/20/13  1207 History   First MD Initiated Contact with Patient 10/20/13 1305     Chief Complaint  Patient presents with  . Abdominal Pain     (Consider location/radiation/quality/duration/timing/severity/associated sxs/prior Treatment) The history is provided by the patient and a relative.    Theresa Bauer is a 80 y.o. female who presents for evaluation of constipation. She used MiraLax 2 days ago, and had a large bowel movement. Since then she has only passed a small amount of stool. She denies fever, chills, nausea, vomiting, cough, or shortness of breath, or chest pain. His mild, lower abdominal pain. Her appetite has been decreased today, but she is able to eat some. She's not had a bowel movement in 2 days. She has history of constipation in the past. She takes narcotic pain relievers. There are no other known modifying factors.   Past Medical History  Diagnosis Date  . Hypertension   . GERD (gastroesophageal reflux disease)    Past Surgical History  Procedure Laterality Date  . Replacement total knee bilateral  2007/2008   Family History  Problem Relation Age of Onset  . Heart attack Mother   . Heart attack Father   . Osteoarthritis Father    History  Substance Use Topics  . Smoking status: Never Smoker   . Smokeless tobacco: Not on file  . Alcohol Use: No   OB History   Grav Para Term Preterm Abortions TAB SAB Ect Mult Living   0 0          Review of Systems  All other systems reviewed and are negative.     Allergies  Lisinopril; Darvocet; Aspirin; and Penicillins  Home Medications   Prior to Admission medications   Medication Sig Start Date End Date Taking? Authorizing Provider  acetaminophen-codeine (TYLENOL #3) 300-30 MG per tablet Take 1 tablet by mouth 2 (two) times daily as needed. For pain   Yes Historical Provider, MD  alendronate (FOSAMAX) 70 MG tablet Take 70 mg by mouth every 7 (seven) days. Take  with a full glass of water on an empty stomach.   Yes Historical Provider, MD  amLODipine (NORVASC) 5 MG tablet Take 5 mg by mouth daily.   Yes Historical Provider, MD  clopidogrel (PLAVIX) 75 MG tablet Take 1 tablet (75 mg total) by mouth daily. 10/02/12  Yes Laveda Norman, MD  hydrochlorothiazide (HYDRODIURIL) 12.5 MG tablet Take 12.5 mg by mouth daily.   Yes Historical Provider, MD  HYDROcodone-acetaminophen (NORCO/VICODIN) 5-325 MG per tablet Take 1 tablet by mouth every 8 (eight) hours as needed for moderate pain or severe pain (foot & back pain). 03/12/13  Yes Rodolph Bong, MD  loratadine (CLARITIN) 10 MG tablet Take 10 mg by mouth daily.   Yes Historical Provider, MD  Multiple Vitamins-Minerals (MULTIVITAMINS THER. W/MINERALS) TABS tablet Take 1 tablet by mouth daily.   Yes Historical Provider, MD  pantoprazole (PROTONIX) 40 MG tablet Take 1 tablet (40 mg total) by mouth daily. 10/02/12  Yes Laveda Norman, MD  Travoprost, BAK Free, (TRAVATAN) 0.004 % SOLN ophthalmic solution Place 1 drop into both eyes at bedtime.   Yes Historical Provider, MD   BP 143/57  Pulse 94  Temp(Src) 98.2 F (36.8 C) (Oral)  Resp 16  SpO2 100% Physical Exam  Nursing note and vitals reviewed. Constitutional: She is oriented to person, place, and time. She appears well-developed and well-nourished.  HENT:  Head: Normocephalic and atraumatic.  Right Ear: External ear normal.  Left Ear: External ear normal.  Eyes: Conjunctivae and EOM are normal. Pupils are equal, round, and reactive to light.  Neck: Normal range of motion and phonation normal. Neck supple.  Cardiovascular: Normal rate, regular rhythm and normal heart sounds.   Pulmonary/Chest: Effort normal and breath sounds normal. She exhibits no bony tenderness.  Abdominal: Soft. There is tenderness (Suprapubic, mild). There is no guarding.  Genitourinary:  Normal anus. Brown stool in rectal, no mass. No impaction  Musculoskeletal: Normal range of motion.   Neurological: She is alert and oriented to person, place, and time. No cranial nerve deficit or sensory deficit. She exhibits normal muscle tone. Coordination normal.  Skin: Skin is warm, dry and intact.  Psychiatric: She has a normal mood and affect. Her behavior is normal. Judgment and thought content normal.    ED Course  Procedures (including critical care time) Labs Review Labs Reviewed  CBC WITH DIFFERENTIAL - Abnormal; Notable for the following:    Hemoglobin 10.9 (*)    HCT 32.8 (*)    MCV 68.9 (*)    MCH 22.9 (*)    RDW 16.1 (*)    Neutrophils Relative % 37 (*)    Lymphocytes Relative 49 (*)    Neutro Abs 1.5 (*)    All other components within normal limits  COMPREHENSIVE METABOLIC PANEL - Abnormal; Notable for the following:    Potassium 3.6 (*)    Glucose, Bld 118 (*)    GFR calc non Af Amer 78 (*)    All other components within normal limits  URINALYSIS, ROUTINE W REFLEX MICROSCOPIC  POC OCCULT BLOOD, ED    Imaging Review No results found.   EKG Interpretation None      MDM   Final diagnoses:  Constipation, unspecified constipation type    Constipated, likely related to use of narcotic pain relievers. There is no impaction. Her exam is benign. There are no signs of infection.   ,Nursing Notes Reviewed/ Care Coordinated Applicable Imaging Reviewed Interpretation of Laboratory Data incorporated into ED treatment  The patient appears reasonably screened and/or stabilized for discharge and I doubt any other medical condition or other [facility] requiring further screening, evaluation, or treatment in the ED at this time prior to discharge.  Plan: Home Medications- Miralax BID prn; Home Treatments- increase fluids and fiber in diet; return here if the recommended treatment, does not improve the symptoms; Recommended follow up- PCP for check up in 1 week    Flint Melter, MD 10/20/13 1513

## 2013-10-20 NOTE — ED Notes (Signed)
Per pt, states constipation 2 days ago-use laxative with minimal relief-since then increased back pain and lower abdominal pain

## 2013-10-20 NOTE — Progress Notes (Signed)
Patient verbalized understanding of discharge instructions. Patient is stable at discharge. 

## 2013-12-05 ENCOUNTER — Encounter (HOSPITAL_COMMUNITY): Payer: Self-pay | Admitting: Emergency Medicine

## 2014-02-06 ENCOUNTER — Encounter (HOSPITAL_COMMUNITY): Payer: Self-pay | Admitting: Emergency Medicine

## 2014-02-06 ENCOUNTER — Emergency Department (HOSPITAL_COMMUNITY)
Admission: EM | Admit: 2014-02-06 | Discharge: 2014-02-06 | Disposition: A | Discharge status: Home / Self Care | Payer: Medicare Other | Attending: Emergency Medicine | Admitting: Emergency Medicine

## 2014-02-06 DIAGNOSIS — K219 Gastro-esophageal reflux disease without esophagitis: Secondary | ICD-10-CM | POA: Insufficient documentation

## 2014-02-06 DIAGNOSIS — R2241 Localized swelling, mass and lump, right lower limb: Secondary | ICD-10-CM | POA: Diagnosis present

## 2014-02-06 DIAGNOSIS — Z79899 Other long term (current) drug therapy: Secondary | ICD-10-CM | POA: Diagnosis not present

## 2014-02-06 DIAGNOSIS — Z880 Allergy status to penicillin: Secondary | ICD-10-CM | POA: Diagnosis not present

## 2014-02-06 DIAGNOSIS — I10 Essential (primary) hypertension: Secondary | ICD-10-CM | POA: Diagnosis not present

## 2014-02-06 DIAGNOSIS — M79609 Pain in unspecified limb: Secondary | ICD-10-CM

## 2014-02-06 DIAGNOSIS — Z7902 Long term (current) use of antithrombotics/antiplatelets: Secondary | ICD-10-CM | POA: Diagnosis not present

## 2014-02-06 DIAGNOSIS — M7989 Other specified soft tissue disorders: Secondary | ICD-10-CM

## 2014-02-06 MED ORDER — OXYCODONE-ACETAMINOPHEN 5-325 MG PO TABS
1.0000 | ORAL_TABLET | ORAL | Status: DC | PRN
Start: 2014-02-06 — End: 2014-03-22

## 2014-02-06 MED ORDER — MORPHINE SULFATE 4 MG/ML IJ SOLN
6.0000 mg | Freq: Once | INTRAMUSCULAR | Status: AC
  Administered 2014-02-06: 6 mg via INTRAVENOUS
  Filled 2014-02-06: qty 2

## 2014-02-06 NOTE — Progress Notes (Signed)
CSW met with patient. Daughter was at bedside. Patient states she lives alone in her home in [place]. Patient informed CSW that she presents to the ED because of right leg pain. Patient stated that her leg has been hurting for 2 weeks now.  Patient says that she does complete her ADL's independently, but feels as if she needs more help. Patient is being discharged. CSW informed her that she can call her insurance and they can set up home health for her.  Daughter/Blache Welte [phone] Omelia Blackwater [phone] ED CSW 02/06/2014 8:59 PM

## 2014-02-06 NOTE — ED Notes (Signed)
Bed: ZO10 Expected date:  Expected time:  Means of arrival:  Comments: EMS- emesis, from facility

## 2014-02-06 NOTE — Discharge Instructions (Signed)
Follow-up with your Dr. tomorrow. Return here for any numbness to your foot, shortness of breath or chest pain, or any other problems

## 2014-02-06 NOTE — ED Notes (Signed)
Pt c/o edema and pain in right lower leg to right foot. Area is reddened, hot, skin feels taut. Pt states this began weeks ago, exact date unknown, states PCP has prescribed her Abx for swelling.

## 2014-02-06 NOTE — ED Provider Notes (Signed)
CSN: [id number]     Arrival date & time 02/06/14  1533 History   First MD Initiated Contact with Patient 02/06/14 1742     No chief complaint on file.    (Consider location/radiation/quality/duration/timing/severity/associated sxs/prior Treatment) HPI Comments: Patient here complaining of two-week history of right lower extremity swelling. Has been seen by her physician for this and treated with antibiotics and pain medications without improvement. Pain characterized as dull and worse with standing. No recent history of trauma. Denies any hip pain. No fever or chills. No prior history of gout. Symptoms persistent. No new treatments used prior to arrival  The history is provided by the patient.    Past Medical History  Diagnosis Date  . Hypertension   . GERD (gastroesophageal reflux disease)    Past Surgical History  Procedure Laterality Date  . Replacement total knee bilateral  2007/2008   Family History  Problem Relation Age of Onset  . Heart attack Mother   . Heart attack Father   . Osteoarthritis Father    History  Substance Use Topics  . Smoking status: Never Smoker   . Smokeless tobacco: Not on file  . Alcohol Use: No   OB History    Gravida Para Term Preterm AB TAB SAB Ectopic Multiple Living   0 0          Review of Systems  All other systems reviewed and are negative.     Allergies  Lisinopril; Darvocet; Aspirin; and Penicillins  Home Medications   Prior to Admission medications   Medication Sig Start Date End Date Taking? Authorizing Provider  acetaminophen-codeine (TYLENOL #3) 300-30 MG per tablet Take 1 tablet by mouth 2 (two) times daily as needed. For pain    Historical Provider, MD  alendronate (FOSAMAX) 70 MG tablet Take 70 mg by mouth every 7 (seven) days. Take with a full glass of water on an empty stomach.    Historical Provider, MD  amLODipine (NORVASC) 5 MG tablet Take 5 mg by mouth daily.    Historical Provider, MD  clopidogrel (PLAVIX)  75 MG tablet Take 1 tablet (75 mg total) by mouth daily. 10/02/12   Laveda Norman, MD  hydrochlorothiazide (HYDRODIURIL) 12.5 MG tablet Take 12.5 mg by mouth daily.    Historical Provider, MD  HYDROcodone-acetaminophen (NORCO/VICODIN) 5-325 MG per tablet Take 1 tablet by mouth every 8 (eight) hours as needed for moderate pain or severe pain (foot & back pain). 03/12/13   Rodolph Bong, MD  loratadine (CLARITIN) 10 MG tablet Take 10 mg by mouth daily.    Historical Provider, MD  Multiple Vitamins-Minerals (MULTIVITAMINS THER. W/MINERALS) TABS tablet Take 1 tablet by mouth daily.    Historical Provider, MD  pantoprazole (PROTONIX) 40 MG tablet Take 1 tablet (40 mg total) by mouth daily. 10/02/12   Laveda Norman, MD  Travoprost, BAK Free, (TRAVATAN) 0.004 % SOLN ophthalmic solution Place 1 drop into both eyes at bedtime.    Historical Provider, MD   BP 141/58 mmHg  Pulse 73  Temp(Src) 98.1 F (36.7 C) (Oral)  Resp 16  SpO2 99% Physical Exam  Constitutional: She is oriented to person, place, and time. She appears well-developed and well-nourished.  Non-toxic appearance. No distress.  HENT:  Head: Normocephalic and atraumatic.  Eyes: Conjunctivae, EOM and lids are normal. Pupils are equal, round, and reactive to light.  Neck: Normal range of motion. Neck supple. No tracheal deviation present. No thyroid mass present.  Cardiovascular: Normal  rate, regular rhythm and normal heart sounds.  Exam reveals no gallop.   No murmur heard. Pulmonary/Chest: Effort normal and breath sounds normal. No stridor. No respiratory distress. She has no decreased breath sounds. She has no wheezes. She has no rhonchi. She has no rales.  Abdominal: Soft. Normal appearance and bowel sounds are normal. She exhibits no distension. There is no tenderness. There is no rebound and no CVA tenderness.  Musculoskeletal: Normal range of motion. She exhibits no edema or tenderness.       Legs: Neurological: She is alert and oriented to  person, place, and time. She has normal strength. No cranial nerve deficit or sensory deficit. GCS eye subscore is 4. GCS verbal subscore is 5. GCS motor subscore is 6.  Skin: Skin is warm and dry. No abrasion and no rash noted.  Psychiatric: She has a normal mood and affect. Her speech is normal and behavior is normal.  Nursing note and vitals reviewed.   ED Course  Procedures (including critical care time) Labs Review Labs Reviewed - No data to display  Imaging Review No results found.   EKG Interpretation None      MDM   Final diagnoses:  None    Patient with negative Doppler of her lower extremity. No signs of cellulitis at this time. Patient be given prescription for pain medication and follow-up with her primary care doctor tomorrow    Toy Baker, MD 02/06/14 2001

## 2014-02-06 NOTE — Progress Notes (Signed)
Right lower extremity venous duplex completed.  Right:  No evidence of DVT, superficial thrombosis, or Baker's cyst.  Left:  Negative for DVT in the common femoral vein.  

## 2014-02-15 ENCOUNTER — Other Ambulatory Visit: Payer: Self-pay | Admitting: *Deleted

## 2014-02-15 DIAGNOSIS — I83891 Varicose veins of right lower extremities with other complications: Secondary | ICD-10-CM

## 2014-03-02 ENCOUNTER — Telehealth: Payer: Self-pay | Admitting: Vascular Surgery

## 2014-03-02 NOTE — Telephone Encounter (Addendum)
-----   Message from Erenest Blankarol Sue Pullins, RN sent at 02/28/2014  1:33 PM EST ----- Regarding: request for earlier appt. [facility] requesting that we see this pt. sooner than her appt. 2/17. I tried to call their office, however they're closed from 1:00-2:00 pm.  Could you check on any options for an earlier appt.  It is for VV, so not sure about pushing to get her in sooner.  (I noticed that we haven't seen her since 07/2010, so technically she would be a new pt.)  Derrick's  # is [phone].  03/02/14: I have placed this patient on our wait list. We do not have any openings prior to her appointment on 02/17 at this time. I have notified Derrick via a message.

## 2014-03-13 ENCOUNTER — Ambulatory Visit (INDEPENDENT_AMBULATORY_CARE_PROVIDER_SITE_OTHER): Payer: Medicare Other | Admitting: Ophthalmology

## 2014-03-16 ENCOUNTER — Ambulatory Visit: Payer: Medicare Other | Admitting: Vascular Surgery

## 2014-03-16 ENCOUNTER — Encounter (HOSPITAL_COMMUNITY): Payer: Medicare Other

## 2014-03-21 ENCOUNTER — Encounter: Payer: Self-pay | Admitting: Vascular Surgery

## 2014-03-22 ENCOUNTER — Encounter: Payer: Self-pay | Admitting: Vascular Surgery

## 2014-03-22 ENCOUNTER — Ambulatory Visit (HOSPITAL_COMMUNITY)
Admission: RE | Admit: 2014-03-22 | Discharge: 2014-03-22 | Disposition: A | Discharge status: Home / Self Care | Payer: Medicare Other | Source: Ambulatory Visit | Attending: Vascular Surgery | Admitting: Vascular Surgery

## 2014-03-22 ENCOUNTER — Ambulatory Visit (INDEPENDENT_AMBULATORY_CARE_PROVIDER_SITE_OTHER): Payer: Medicare Other | Admitting: Vascular Surgery

## 2014-03-22 VITALS — BP 150/71 | HR 78 | Resp 18 | Ht 60.0 in | Wt 148.0 lb

## 2014-03-22 DIAGNOSIS — L03115 Cellulitis of right lower limb: Secondary | ICD-10-CM

## 2014-03-22 DIAGNOSIS — I83891 Varicose veins of right lower extremities with other complications: Secondary | ICD-10-CM | POA: Diagnosis not present

## 2014-03-22 MED ORDER — DOXYCYCLINE HYCLATE 100 MG PO CAPS: 100.0000 mg | ORAL_CAPSULE | Freq: Two times a day (BID) | ORAL | Status: DC

## 2014-03-23 NOTE — Progress Notes (Signed)
[facility] [place] HISTORY AND PHYSICAL   History of Present Illness:  Patient is a 81 y.o. year old female who presents for evaluation of pain in her right lower extremity. The patient was seen 4 years ago for complaints of skin changes that appeared to be venous in nature. She states that currently she hurts from the knee down on both legs. She states the right leg is worse. She states the pain began to sooner she takes her first step. Is been going on for approximately 3 months. She has also had some swelling in her legs. This is improved somewhat with wearing compression stockings and elevation. However, she stopped wearing her compression stockings 6-8 months ago. She thinks also the skin has become darker in both legs.  She denies claudication symptoms. She denies any ulceration. She does complain of itching over the lateral aspect of her right leg.  Other medical problems include hypertension and reflux both of which have been stable.  Past Medical History  Diagnosis Date  . Hypertension   . GERD (gastroesophageal reflux disease)     Past Surgical History  Procedure Laterality Date  . 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 Smoking status: Never Smoker   . Smokeless tobacco: Never Used  . Alcohol Use: No    Family History Family History  Problem Relation Age of Onset  . Heart attack Mother   . Heart attack Father   . Osteoarthritis Father     Allergies  Allergies  Allergen Reactions  . Lisinopril Anaphylaxis  . Darvocet [Propoxyphene N-Acetaminophen] Itching  . Aspirin     itching  . Penicillins     itching     Current Outpatient Prescriptions  Medication Sig Dispense Refill  . alendronate (FOSAMAX) 70 MG tablet Take 70 mg by mouth every 7 (seven) days. Take with a full glass of water on an empty stomach.    Marland Kitchen amLODipine (NORVASC) 5 MG tablet Take 5 mg by mouth daily.    . clopidogrel  (PLAVIX) 75 MG tablet Take 1 tablet (75 mg total) by mouth daily. 30 tablet 0  . hydrochlorothiazide (HYDRODIURIL) 12.5 MG tablet Take 12.5 mg by mouth daily.    Marland Kitchen loratadine (CLARITIN) 10 MG tablet Take 10 mg by mouth daily.    . Multiple Vitamins-Minerals (MULTIVITAMINS THER. W/MINERALS) TABS tablet Take 1 tablet by mouth daily.    . Omega-3 Fatty Acids (FISH OIL PO) Take by mouth daily.    . pantoprazole (PROTONIX) 40 MG tablet Take 1 tablet (40 mg total) by mouth daily. 30 tablet 0  . Travoprost, BAK Free, (TRAVATAN) 0.004 % SOLN ophthalmic solution Place 1 drop into both eyes at bedtime.    . triamcinolone cream (KENALOG) 0.5 % Apply 1 application topically 3 (three) times daily.    Marland Kitchen doxycycline (VIBRAMYCIN) 100 MG capsule Take 1 capsule (100 mg total) by mouth 2 (two) times daily. 28 capsule 0   No current facility-administered medications for this visit.    ROS:   General:  No weight loss, Fever, chills  HEENT: No recent headaches, no nasal bleeding, no visual changes, no sore throat  Neurologic: No dizziness, blackouts, seizures. No recent symptoms of stroke or mini- stroke. No recent episodes of slurred speech, or temporary blindness.  Cardiac: No recent episodes of chest pain/pressure, no shortness of breath at rest.  No shortness of breath with exertion.  Denies history of atrial  fibrillation or irregular heartbeat  Vascular: No history of rest pain in feet.  No history of claudication.  No history of non-healing ulcer, No history of DVT   Pulmonary: No home oxygen, no productive cough, no hemoptysis,  No asthma or wheezing  Musculoskeletal:  [ ]  Arthritis, [ ]  Low back pain,  [ ]  Joint pain  Hematologic:No history of hypercoagulable state.  No history of easy bleeding.  No history of anemia  Gastrointestinal: No hematochezia or melena,  No gastroesophageal reflux, no trouble swallowing  Urinary: [ ]  chronic Kidney disease, [ ]  on HD - [ ]  MWF or [ ]  TTHS, [ ]  Burning  with urination, [ ]  Frequent urination, [ ]  Difficulty urinating;   Skin: No rashes  Psychological: No history of anxiety,  No history of depression   Physical Examination  Filed Vitals:   03/22/14 1531  BP: 150/71  Pulse: 78  Resp: 18  Height: 5' (1.524 m)  Weight: 148 lb (67.132 kg)    Body mass index is 28.9 kg/(m^2).  General:  Alert and oriented, no acute distress HEENT: Normal Neck: No bruit or JVD Pulmonary: Clear to auscultation bilaterally Cardiac: Regular Rate and Rhythm without murmur Abdomen: Soft, non-tender, non-distended, no mass Skin: No rash, hemosiderin staining bilateral gaiter area of legs, 2+ dorsalis pedis pulses bilaterally, some erythema and warmth over the lateral aspect of her right leg above the right lateral malleolus, some tenderness over the right anterior compartment Extremity Pulses:  2+ radial, brachial, femoral, dorsalis pedis pulses bilaterally Musculoskeletal: No deformity trace edema lower extremities bilaterally  Neurologic: Upper and lower extremity motor 5/5 and symmetric  DATA:  Patient had a venous reflux exam today which showed no evidence of deep or superficial venous reflux or DVT.   ASSESSMENT:  Cellulitis right lower extremity. 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 Overall are physiologic vein function is fairly good. She does not have any significant varicosities. She does not have any significant reflux.   PLAN:  #1 encouraged patient to start wearing her compression stockings again to help control swelling and produce skin changes over time. #2 I placed her on doxycycline 100 mg twice daily for a 2 week course to try to improve the symptoms in her right leg. If the erythema and pain persists we will consider an MRI of her right leg. She will follow-up in 2 weeks.  Fabienne Brunsharles Fields, MD [facility] [place] Office: [phone] Pager:  [phone]

## 2014-04-05 ENCOUNTER — Encounter: Payer: Self-pay | Admitting: Vascular Surgery

## 2014-04-06 ENCOUNTER — Encounter: Payer: Self-pay | Admitting: Vascular Surgery

## 2014-04-06 ENCOUNTER — Ambulatory Visit (INDEPENDENT_AMBULATORY_CARE_PROVIDER_SITE_OTHER): Payer: Medicare Other | Admitting: Vascular Surgery

## 2014-04-06 VITALS — BP 159/79 | HR 76 | Resp 14 | Ht 60.0 in | Wt 145.0 lb

## 2014-04-06 DIAGNOSIS — M79604 Pain in right leg: Secondary | ICD-10-CM

## 2014-04-06 DIAGNOSIS — L03115 Cellulitis of right lower limb: Secondary | ICD-10-CM | POA: Diagnosis not present

## 2014-04-06 MED ORDER — DOXYCYCLINE HYCLATE 100 MG PO CAPS: 100.0000 mg | ORAL_CAPSULE | Freq: Two times a day (BID) | ORAL | Status: DC

## 2014-04-06 NOTE — Addendum Note (Signed)
Addended by: Sharee PimpleMCCHESNEY, MARILYN K on: 04/06/2014 05:07 PM   Modules accepted: Orders

## 2014-04-06 NOTE — Progress Notes (Signed)
[facility] [place] HISTORY AND PHYSICAL    History of Present Illness:  Patient is a 81 y.o. year old female who presents for follow-up evaluation of pain in her right lower extremity. The patient was seen 4 years ago for complaints of skin changes that appeared to be venous in nature. She states that currently she hurts from the knee down on both legs. She states the right leg is worse. She states the pain began to sooner she takes her first step. Is been going on for approximately 3 months. She has also had some swelling in her legs. This is improved somewhat with wearing compression stockings and elevation. However, she stopped wearing her compression stockings 6-8 months ago. She thinks also the skin has become darker in both legs.  She denies claudication symptoms. She denies any ulceration. She does complain of itching over the lateral aspect of her right leg.  she was placed on doxycycline 2 weeks ago. She states that the pain has improved but is still present. There is still warmth over the lateral aspect of the leg. Other medical problems include hypertension and reflux both of which have been stable.    Past Medical History   Diagnosis  Date   .  Hypertension     .  GERD (gastroesophageal reflux disease)         Past Surgical History   Procedure  Laterality  Date   .  Replacement total knee bilateral    2007/2008     Social History History   Substance Use Topics   .  Smoking status:  Never Smoker    .  Smokeless tobacco:  Never Used   .  Alcohol Use:  No     Family History Family History   Problem  Relation  Age of Onset   .  Heart attack  Mother     .  Heart attack  Father     .  Osteoarthritis  Father       Allergies    Allergies   Allergen  Reactions   .  Lisinopril  Anaphylaxis   .  Darvocet [Propoxyphene N-Acetaminophen]  Itching   .  Aspirin         itching   .  Penicillins         itching        Current Outpatient Prescriptions on  File Prior to Visit  Medication Sig Dispense Refill  . alendronate (FOSAMAX) 70 MG tablet Take 70 mg by mouth every 7 (seven) days. Take with a full glass of water on an empty stomach.    Marland Kitchen. amLODipine (NORVASC) 5 MG tablet Take 5 mg by mouth daily.    . clopidogrel (PLAVIX) 75 MG tablet Take 1 tablet (75 mg total) by mouth daily. (Patient not taking: Reported on 04/06/2014) 30 tablet 0  . hydrochlorothiazide (HYDRODIURIL) 12.5 MG tablet Take 12.5 mg by mouth daily.    Marland Kitchen. loratadine (CLARITIN) 10 MG tablet Take 10 mg by mouth daily.    . Multiple Vitamins-Minerals (MULTIVITAMINS THER. W/MINERALS) TABS tablet Take 1 tablet by mouth daily.    . Omega-3 Fatty Acids (FISH OIL PO) Take by mouth daily.    . pantoprazole (PROTONIX) 40 MG tablet Take 1 tablet (40 mg total) by mouth daily. 30 tablet 0  . Travoprost, BAK Free, (TRAVATAN) 0.004 % SOLN ophthalmic solution Place 1 drop into both eyes at bedtime.    . triamcinolone cream (KENALOG) 0.5 % Apply  1 application topically 3 (three) times daily.     No current facility-administered medications on file prior to visit.    ROS:    General:  No weight loss, Fever, chills  HEENT: No recent headaches, no nasal bleeding, no visual changes, no sore throat  Neurologic: No dizziness, blackouts, seizures. No recent symptoms of stroke or mini- stroke. No recent episodes of slurred speech, or temporary blindness.  Cardiac: No recent episodes of chest pain/pressure, no shortness of breath at rest.  No shortness of breath with exertion.  Denies history of atrial fibrillation or irregular heartbeat  Vascular: No history of rest pain in feet.  No history of claudication.  No history of non-healing ulcer, No history of DVT    Pulmonary: No home oxygen, no productive cough, no hemoptysis,  No asthma or wheezing  Musculoskeletal:   Arthritis,  Low back pain,   Joint pain  Hematologic:No history of hypercoagulable state.  No history of easy bleeding.  No  history of anemia  Gastrointestinal: No hematochezia or melena,  No gastroesophageal reflux, no trouble swallowing  Urinary:  chronic Kidney disease,  on HD -  MWF or  TTHS,  Burning with urination,  Frequent urination,  Difficulty urinating;    Skin: No rashes  Psychological: No history of anxiety,  No history of depression   Physical Examination     Filed Vitals:   04/06/14 1542  BP: 159/79  Pulse: 76  Resp: 14  Height: 5' (1.524 m)  Weight: 145 lb (65.772 kg)   General:  Alert and oriented, no acute distress HEENT: Normal Neck: No bruit or JVD Pulmonary: Clear to auscultation bilaterally Cardiac: Regular Rate and Rhythm without murmur Abdomen: Soft, non-tender, non-distended, no mass Skin: No rash, hemosiderin staining bilateral gaiter area of legs, 2+ dorsalis pedis pulses bilaterally, some erythema and warmth over the lateral aspect of her right leg above the right lateral malleolus, some tenderness over the right anterior compartment, this area is still indurated  Extremity Pulses:  2+ radial, brachial, femoral, dorsalis pedis pulses bilaterally Musculoskeletal: No deformity trace edema lower extremities bilaterally    Neurologic: Upper and lower extremity motor 5/5 and symmetric  DATA:  Patient had a venous reflux exam today previously which showed no evidence of deep or superficial venous reflux or DVT.   ASSESSMENT:  Cellulitis right lower extremity improved but persistent. 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 Overall are physiologic vein function is fairly good. She does not have any significant varicosities. She does not have any significant reflux.   PLAN:  #1 encouraged patient to start wearing her compression stockings again to help control swelling and produce skin changes over time. #2 continue doxycycline 100 mg twice daily for a total of 4 weeks  to try to improve the  symptoms in her right leg.the patient has bilateral prosthetic knees so she is not a candidate for MRI. We will obtain a CT scan of her right leg to make sure that she does not have an occult abscess or other reason to develop a seated problem causing cellulitis. She will follow-up in 2 weeks.  Fabienne Bruns, MD [facility] [place] Office: [phone] Pager: [phone]

## 2014-04-11 ENCOUNTER — Other Ambulatory Visit: Payer: Self-pay | Admitting: Vascular Surgery

## 2014-04-11 LAB — CREATININE, SERUM: Creat: 0.62 mg/dL (ref 0.50–1.10)

## 2014-04-11 LAB — BUN: BUN: 11 mg/dL (ref 6–23)

## 2014-04-13 ENCOUNTER — Other Ambulatory Visit: Payer: Self-pay

## 2014-04-13 DIAGNOSIS — L0390 Cellulitis, unspecified: Secondary | ICD-10-CM

## 2014-04-13 DIAGNOSIS — I871 Compression of vein: Secondary | ICD-10-CM

## 2014-04-13 DIAGNOSIS — M79606 Pain in leg, unspecified: Secondary | ICD-10-CM

## 2014-04-13 DIAGNOSIS — I70219 Atherosclerosis of native arteries of extremities with intermittent claudication, unspecified extremity: Secondary | ICD-10-CM

## 2014-04-13 DIAGNOSIS — I739 Peripheral vascular disease, unspecified: Secondary | ICD-10-CM

## 2014-04-13 DIAGNOSIS — R609 Edema, unspecified: Secondary | ICD-10-CM

## 2014-04-14 ENCOUNTER — Ambulatory Visit
Admission: RE | Admit: 2014-04-14 | Discharge: 2014-04-14 | Disposition: A | Discharge status: Home / Self Care | Payer: Medicare Other | Source: Ambulatory Visit | Attending: Vascular Surgery | Admitting: Vascular Surgery

## 2014-04-14 DIAGNOSIS — L03115 Cellulitis of right lower limb: Secondary | ICD-10-CM

## 2014-04-14 DIAGNOSIS — M79604 Pain in right leg: Secondary | ICD-10-CM

## 2014-04-14 MED ORDER — IOHEXOL 350 MG/ML SOLN
100.0000 mL | Freq: Once | INTRAVENOUS | Status: AC | PRN
  Administered 2014-04-14: 100 mL via INTRAVENOUS

## 2014-04-18 ENCOUNTER — Encounter: Payer: Self-pay | Admitting: Vascular Surgery

## 2014-04-19 ENCOUNTER — Ambulatory Visit (INDEPENDENT_AMBULATORY_CARE_PROVIDER_SITE_OTHER): Payer: Medicare Other | Admitting: Vascular Surgery

## 2014-04-19 ENCOUNTER — Encounter: Payer: Self-pay | Admitting: Vascular Surgery

## 2014-04-19 VITALS — BP 168/102 | HR 77 | Resp 16 | Ht 60.0 in | Wt 140.0 lb

## 2014-04-19 DIAGNOSIS — I70219 Atherosclerosis of native arteries of extremities with intermittent claudication, unspecified extremity: Secondary | ICD-10-CM

## 2014-04-19 DIAGNOSIS — L03115 Cellulitis of right lower limb: Secondary | ICD-10-CM | POA: Diagnosis not present

## 2014-04-19 NOTE — Progress Notes (Signed)
[facility] [place] HISTORY AND PHYSICAL    History of Present Illness:  Patient is a 81 y.o. year old female who presents for follow-up evaluation of pain in her right lower extremity. This has improved significantly over the last 2 months. She is almost finished with a course of doxycycline. She was treated for cellulitis. The patient was seen 4 years ago for complaints of skin changes that appeared to be venous in nature. She states that currently she hurts from the knee down on both legs. She denies claudication symptoms. She denies any ulceration. She does complain of itching over the lateral aspect of her right leg.  she was placed on doxycycline 2 weeks ago. She states that the pain has improved but is still present. There is still warmth over the lateral aspect of the leg. Other medical problems include hypertension and reflux both of which have been stable.  Current Outpatient Prescriptions on File Prior to Visit  Medication Sig Dispense Refill  . acetaminophen (TYLENOL) 650 MG CR tablet Take 650 mg by mouth every 8 (eight) hours as needed for pain.    Marland Kitchen. alendronate (FOSAMAX) 70 MG tablet Take 70 mg by mouth every 7 (seven) days. Take with a full glass of water on an empty stomach.    Marland Kitchen. amLODipine (NORVASC) 5 MG tablet Take 5 mg by mouth daily.    . clopidogrel (PLAVIX) 75 MG tablet Take 1 tablet (75 mg total) by mouth daily. 30 tablet 0  . doxycycline (VIBRAMYCIN) 100 MG capsule Take 1 capsule (100 mg total) by mouth 2 (two) times daily. 28 capsule 0  . hydrochlorothiazide (HYDRODIURIL) 12.5 MG tablet Take 12.5 mg by mouth daily.    Marland Kitchen. loratadine (CLARITIN) 10 MG tablet Take 10 mg by mouth daily.    . Multiple Vitamins-Minerals (MULTIVITAMINS THER. W/MINERALS) TABS tablet Take 1 tablet by mouth daily.    . Omega-3 Fatty Acids (FISH OIL PO) Take by mouth daily.    . pantoprazole (PROTONIX) 40 MG tablet Take 1 tablet (40 mg total) by mouth daily. 30 tablet 0  .  Travoprost, BAK Free, (TRAVATAN) 0.004 % SOLN ophthalmic solution Place 1 drop into both eyes at bedtime.    . triamcinolone cream (KENALOG) 0.5 % Apply 1 application topically 3 (three) times daily.     No current facility-administered medications on file prior to visit.        Review of systems: She denies any fever or chills. She denies any open wounds on her feet.  Physical Examination        Filed Vitals:   04/19/14 1640  BP: 168/102  Pulse: 77  Resp: 16  Height: 5' (1.524 m)  Weight: 140 lb (63.504 kg)   General:  Alert and oriented, no acute distress Skin: No rash, hemosiderin staining bilateral gaiter area of legs, 2+ dorsalis pedis pulses bilaterally, some erythema and warmth over the lateral aspect of her right leg above the right lateral malleolus, some tenderness over the right anterior compartment, this area is still indurated, overall improved much less tender to palpation   Extremity Pulses:  2+ radial, brachial, femoral, dorsalis pedis pulses bilaterally Musculoskeletal: No deformity trace edema lower extremities bilaterally    Neurologic: Upper and lower extremity motor 5/5 and symmetric  DATA:  Patient had a prior venous reflux exam today previously which showed no evidence of deep or superficial venous reflux or DVT.  CT venogram images were reviewed today. This shows cellulitis in the right  leg but no evidence of tumor abscess in the deep space of the right leg. There was thrombus within the left peroneal vein of unknown chronicity   ASSESSMENT:  Cellulitis right lower extremity improved but persistent. 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 Overall are physiologic vein function is fairly good. She does not have any significant varicosities. She does not have any significant reflux.   PLAN:  #1 encouraged patient to start wearing her compression stockings again to help control swelling and produce  skin changes over time. #2 continue doxycycline 100 mg twice daily.  This is almost complete.  She will follow-up on as-needed basis. #3 peroneal vein thrombus unknown time course essentially asymptomatic would not consider treatment unless left leg becomes more swollen.   Fabienne Bruns, MD [facility] [place] Office: [phone] Pager: [phone]

## 2014-04-25 ENCOUNTER — Other Ambulatory Visit: Payer: Self-pay | Admitting: Vascular Surgery

## 2014-05-18 ENCOUNTER — Telehealth: Payer: Self-pay

## 2014-05-18 NOTE — Telephone Encounter (Signed)
Returning call to patient. Left message on patient's answering machine.

## 2014-05-23 ENCOUNTER — Telehealth: Payer: Self-pay

## 2014-05-23 NOTE — Telephone Encounter (Signed)
RE: recommendation for any labs studies  Received: Today    Theresa Kernsharles E Fields, MD  Smitty Pluckarol S Scottlynn Lindell, RN           There is no vascular issue. Her veins and arteries are normal. She has a cellulitis. She can see her PCP or Infectious disease   Theresa Bauer     Phone call to pt.  Advised of Dr. Darrick PennaFields recommendations.  Verb. Understanding.

## 2014-05-23 NOTE — Telephone Encounter (Signed)
Phone call from pt.  Requested another appt. with Dr. Darrick PennaFields.  Reported she is still having a "pinching and sticking" type sensation at intervals in the right lower leg.  Reported the discomfort usually occurs with standing and walking.  Stated if she stops, the discomfort eases up.  Reported "swelling in upper portion of right foot to the calf"; stated the pain and swelling improves with elevating.  Reported she also feels "a soreness" inside the foot, and "it feels warm at times."  Describes feeling "a thickness inside the foot."  Stated she has taken 2 rounds of antibiotics.  Reported her symptoms have "much improved", but she still gets the pinching/ sticking-type pain in the lower (R) leg.  Reported she went to her PCP recently, and was told to go back to see Dr. Darrick PennaFields for reevaluation of the pain.  Advised will call pt. back with appt. Information.

## 2014-09-29 ENCOUNTER — Ambulatory Visit: Payer: Medicare Other | Admitting: Internal Medicine

## 2014-11-09 ENCOUNTER — Encounter (HOSPITAL_COMMUNITY): Payer: Self-pay | Admitting: Neurology

## 2014-11-09 ENCOUNTER — Emergency Department (HOSPITAL_COMMUNITY)
Admission: EM | Admit: 2014-11-09 | Discharge: 2014-11-09 | Disposition: A | Discharge status: Home / Self Care | Payer: Medicare Other | Attending: Emergency Medicine | Admitting: Emergency Medicine

## 2014-11-09 ENCOUNTER — Emergency Department (HOSPITAL_COMMUNITY): Payer: Medicare Other

## 2014-11-09 DIAGNOSIS — Z79899 Other long term (current) drug therapy: Secondary | ICD-10-CM | POA: Diagnosis not present

## 2014-11-09 DIAGNOSIS — Z880 Allergy status to penicillin: Secondary | ICD-10-CM | POA: Insufficient documentation

## 2014-11-09 DIAGNOSIS — S79921A Unspecified injury of right thigh, initial encounter: Secondary | ICD-10-CM | POA: Diagnosis not present

## 2014-11-09 DIAGNOSIS — Z7902 Long term (current) use of antithrombotics/antiplatelets: Secondary | ICD-10-CM | POA: Diagnosis not present

## 2014-11-09 DIAGNOSIS — Y9289 Other specified places as the place of occurrence of the external cause: Secondary | ICD-10-CM | POA: Insufficient documentation

## 2014-11-09 DIAGNOSIS — Z792 Long term (current) use of antibiotics: Secondary | ICD-10-CM | POA: Insufficient documentation

## 2014-11-09 DIAGNOSIS — S8991XA Unspecified injury of right lower leg, initial encounter: Secondary | ICD-10-CM | POA: Diagnosis not present

## 2014-11-09 DIAGNOSIS — Y998 Other external cause status: Secondary | ICD-10-CM | POA: Diagnosis not present

## 2014-11-09 DIAGNOSIS — H409 Unspecified glaucoma: Secondary | ICD-10-CM | POA: Diagnosis not present

## 2014-11-09 DIAGNOSIS — Y9389 Activity, other specified: Secondary | ICD-10-CM | POA: Insufficient documentation

## 2014-11-09 DIAGNOSIS — W1839XA Other fall on same level, initial encounter: Secondary | ICD-10-CM | POA: Diagnosis not present

## 2014-11-09 DIAGNOSIS — I10 Essential (primary) hypertension: Secondary | ICD-10-CM | POA: Diagnosis not present

## 2014-11-09 DIAGNOSIS — M79604 Pain in right leg: Secondary | ICD-10-CM

## 2014-11-09 DIAGNOSIS — K219 Gastro-esophageal reflux disease without esophagitis: Secondary | ICD-10-CM | POA: Insufficient documentation

## 2014-11-09 DIAGNOSIS — W19XXXA Unspecified fall, initial encounter: Secondary | ICD-10-CM

## 2014-11-09 DIAGNOSIS — S29001A Unspecified injury of muscle and tendon of front wall of thorax, initial encounter: Secondary | ICD-10-CM | POA: Insufficient documentation

## 2014-11-09 HISTORY — DX: Unspecified glaucoma: H40.9

## 2014-11-09 MED ORDER — ACETAMINOPHEN 325 MG PO TABS
650.0000 mg | ORAL_TABLET | Freq: Once | ORAL | Status: AC
  Administered 2014-11-09: 650 mg via ORAL
  Filled 2014-11-09: qty 2

## 2014-11-09 NOTE — ED Notes (Signed)
Patient transported to X-ray 

## 2014-11-09 NOTE — ED Notes (Signed)
Pt given walker but case management to go home with. Education provided. Pt sitting at RN station until her grandson gets her to pick her up. She has her cane, walker and other belongings with her.

## 2014-11-09 NOTE — ED Notes (Signed)
Per ems- Pt was at [employer] when she tripped on the ramp going in to the building. She landed on her right thigh, was able to catch herself with her hands did not hit her head. No LOC, no blood thinners. Pt is a x 4

## 2014-11-09 NOTE — Discharge Instructions (Signed)

## 2014-11-09 NOTE — ED Provider Notes (Signed)
CSN: [id number]     Arrival date & time 11/09/14  1528 History   First MD Initiated Contact with Patient 11/09/14 1539     Chief Complaint  Patient presents with  . Fall   (Consider location/radiation/quality/duration/timing/severity/associated sxs/prior Treatment) Patient is a 81 y.o. female presenting with fall.  Fall This is a new problem. The current episode started today. The problem occurs rarely. The problem has been unchanged. Associated symptoms include arthralgias. Pertinent negatives include no abdominal pain, chills, diaphoresis, fatigue, nausea, numbness, vomiting or weakness. Associated symptoms comments: Paraspinal tenderness. No midline cervical tenderness. The symptoms are aggravated by walking.    Past Medical History  Diagnosis Date  . Hypertension   . GERD (gastroesophageal reflux disease)   . Glaucoma    Past Surgical History  Procedure Laterality Date  . Replacement total knee bilateral  2007/2008   Family History  Problem Relation Age of Onset  . Heart attack Mother   . Heart attack Father   . Osteoarthritis Father    Social History  Substance Use Topics  . Smoking status: Never Smoker   . Smokeless tobacco: Never Used  . Alcohol Use: No   OB History    Gravida Para Term Preterm AB TAB SAB Ectopic Multiple Living   0 0          Review of Systems  Constitutional: Negative for chills, diaphoresis and fatigue.  Gastrointestinal: Negative for nausea, vomiting and abdominal pain.  Musculoskeletal: Positive for arthralgias.  Neurological: Negative for dizziness, syncope, facial asymmetry, speech difficulty, weakness and numbness.  All other systems reviewed and are negative.     Allergies  Lisinopril; Darvocet; Aspirin; and Penicillins  Home Medications   Prior to Admission medications   Medication Sig Start Date End Date Taking? Authorizing Provider  acetaminophen (TYLENOL) 650 MG CR tablet Take 650 mg by mouth every 8 (eight) hours as  needed for pain.    Historical Provider, MD  alendronate (FOSAMAX) 70 MG tablet Take 70 mg by mouth every 7 (seven) days. Take with a full glass of water on an empty stomach.    Historical Provider, MD  amLODipine (NORVASC) 5 MG tablet Take 5 mg by mouth daily.    Historical Provider, MD  clopidogrel (PLAVIX) 75 MG tablet Take 1 tablet (75 mg total) by mouth daily. 10/02/12   Laveda Norman, MD  doxycycline (VIBRAMYCIN) 100 MG capsule Take 1 capsule (100 mg total) by mouth 2 (two) times daily. 04/06/14   Sherren Kerns, MD  hydrochlorothiazide (HYDRODIURIL) 12.5 MG tablet Take 12.5 mg by mouth daily.    Historical Provider, MD  loratadine (CLARITIN) 10 MG tablet Take 10 mg by mouth daily.    Historical Provider, MD  Multiple Vitamins-Minerals (MULTIVITAMINS THER. W/MINERALS) TABS tablet Take 1 tablet by mouth daily.    Historical Provider, MD  Omega-3 Fatty Acids (FISH OIL PO) Take by mouth daily.    Historical Provider, MD  pantoprazole (PROTONIX) 40 MG tablet Take 1 tablet (40 mg total) by mouth daily. 10/02/12   Laveda Norman, MD  Travoprost, BAK Free, (TRAVATAN) 0.004 % SOLN ophthalmic solution Place 1 drop into both eyes at bedtime.    Historical Provider, MD  triamcinolone cream (KENALOG) 0.5 % Apply 1 application topically 3 (three) times daily.    Historical Provider, MD   BP 173/64 mmHg  Pulse 76  Temp(Src) 97.9 F (36.6 C) (Oral)  Resp 16  SpO2 98% Physical Exam  Constitutional: She is  oriented to person, place, and time. She appears well-developed and well-nourished. No distress.  HENT:  Head: Normocephalic and atraumatic.  Eyes: EOM are normal.  Neck:  Paraspinal tenderness. No midline cervical tenderness  Cardiovascular: Normal rate.   Pulmonary/Chest: Effort normal. No respiratory distress. She has no wheezes.  Minimal chest wall tenderness  Abdominal: Soft. She exhibits no distension. There is no tenderness.  Musculoskeletal: Normal range of motion. She exhibits no edema.   Tenderness to right hip, right femur, right knee. Limited ROM due to pain.  +1 DP pulses bilaterally. No rotation or shortening.   Neurological: She is alert and oriented to person, place, and time. No cranial nerve deficit. She exhibits normal muscle tone. Coordination normal.  Skin: Skin is warm and dry. She is not diaphoretic.  Psychiatric: She has a normal mood and affect. Her behavior is normal. Judgment and thought content normal.  Nursing note and vitals reviewed.   ED Course  Procedures (including critical care time) Imaging Review Dg Ribs Bilateral W/chest  11/09/2014   CLINICAL DATA:  Fall, landing on right side. Diffuse right-sided pain.  EXAM: RIGHT RIBS AND CHEST - three views  COMPARISON:  Chest x-ray dated 08/29/2013.  FINDINGS: Single view of the chest and two views of the right ribs are provided. Comparison is made to chest x-ray dated 08/29/2013.  Cardiomediastinal silhouette is stable in size and configuration. Lungs are clear. No pleural effusions seen. No pneumothorax. There is an old healed fracture of a right lower posterior-lateral rib (probably the right tenth rib). No acute- appearing rib fracture seen.  IMPRESSION: 1. Lungs are clear and there is no evidence of acute cardiopulmonary abnormality. 2. No acute right-sided rib fracture seen.   Electronically Signed   By: Bary Richard M.D.   On: 11/09/2014 17:33   Dg Pelvis 1-2 Views  11/09/2014   CLINICAL DATA:  Tripped on ramp [place] landing on RIGHT thigh, diffuse RIGHT-sided pain post fall  EXAM: PELVIS - 1-2 VIEW  COMPARISON:  03/12/2013  FINDINGS: Osseous demineralization.  Hip and SI joint spaces symmetric and preserved.  No acute fracture, dislocation, or bone destruction.  Injection granulomata project over pelvis bilaterally.  Disc space narrowing L4-L5 with facet degenerative changes lower lumbar spine.  IMPRESSION: Osseous demineralization.  No acute bony abnormalities.   Electronically Signed    By: Ulyses Southward M.D.   On: 11/09/2014 17:16   Ct Head Wo Contrast  11/09/2014   CLINICAL DATA:  Status post fall, no headache.  EXAM: CT HEAD WITHOUT CONTRAST  CT CERVICAL SPINE WITHOUT CONTRAST  TECHNIQUE: Multidetector CT imaging of the head and cervical spine was performed following the standard protocol without intravenous contrast. Multiplanar CT image reconstructions of the cervical spine were also generated.  COMPARISON:  None.  FINDINGS: CT HEAD FINDINGS  There is no evidence of mass effect, midline shift, or extra-axial fluid collections. There is no evidence of a space-occupying lesion or intracranial hemorrhage. There is no evidence of a cortical-based area of acute infarction. There is generalized cerebral atrophy. There is periventricular white matter low attenuation likely secondary to microangiopathy.  The ventricles and sulci are appropriate for the patient's age. The basal cisterns are patent.  Visualized portions of the orbits are unremarkable. There is chronic right maxillary sinusitis with cortical thickening and inspissated mucus in the right maxillary sinus. The mastoid sinuses are clear. Cerebrovascular atherosclerotic calcifications are noted.  The osseous structures are unremarkable.  CT CERVICAL SPINE FINDINGS  The alignment is anatomic. The vertebral body heights are maintained. There is no acute fracture. There is no static listhesis. The prevertebral soft tissues are normal. The intraspinal soft tissues are not fully imaged on this examination due to poor soft tissue contrast, but there is no gross soft tissue abnormality.  There is severe degenerative disc disease at C3-4 with disc space narrowing. Right uncovertebral degenerative change and right facet arthropathy at C3-4 resulting in right foraminal narrowing. Degenerative disc disease at C4-5 with right uncovertebral degenerative change resulting in mild right foraminal narrowing. Bilateral uncovertebral degenerative changes and  disc space narrowing at C5-6. Mild degenerative disc disease at C6-7 and C7-T1.  The visualized portions of the lung apices demonstrate no focal abnormality.  IMPRESSION: 1. No acute intracranial pathology. 2. No acute osseous injury of the cervical spine.   Electronically Signed   By: Elige Ko   On: 11/09/2014 17:39   Ct Cervical Spine Wo Contrast  11/09/2014   CLINICAL DATA:  Status post fall, no headache.  EXAM: CT HEAD WITHOUT CONTRAST  CT CERVICAL SPINE WITHOUT CONTRAST  TECHNIQUE: Multidetector CT imaging of the head and cervical spine was performed following the standard protocol without intravenous contrast. Multiplanar CT image reconstructions of the cervical spine were also generated.  COMPARISON:  None.  FINDINGS: CT HEAD FINDINGS  There is no evidence of mass effect, midline shift, or extra-axial fluid collections. There is no evidence of a space-occupying lesion or intracranial hemorrhage. There is no evidence of a cortical-based area of acute infarction. There is generalized cerebral atrophy. There is periventricular white matter low attenuation likely secondary to microangiopathy.  The ventricles and sulci are appropriate for the patient's age. The basal cisterns are patent.  Visualized portions of the orbits are unremarkable. There is chronic right maxillary sinusitis with cortical thickening and inspissated mucus in the right maxillary sinus. The mastoid sinuses are clear. Cerebrovascular atherosclerotic calcifications are noted.  The osseous structures are unremarkable.  CT CERVICAL SPINE FINDINGS  The alignment is anatomic. The vertebral body heights are maintained. There is no acute fracture. There is no static listhesis. The prevertebral soft tissues are normal. The intraspinal soft tissues are not fully imaged on this examination due to poor soft tissue contrast, but there is no gross soft tissue abnormality.  There is severe degenerative disc disease at C3-4 with disc space narrowing.  Right uncovertebral degenerative change and right facet arthropathy at C3-4 resulting in right foraminal narrowing. Degenerative disc disease at C4-5 with right uncovertebral degenerative change resulting in mild right foraminal narrowing. Bilateral uncovertebral degenerative changes and disc space narrowing at C5-6. Mild degenerative disc disease at C6-7 and C7-T1.  The visualized portions of the lung apices demonstrate no focal abnormality.  IMPRESSION: 1. No acute intracranial pathology. 2. No acute osseous injury of the cervical spine.   Electronically Signed   By: Elige Ko   On: 11/09/2014 17:39   Dg Knee Complete 4 Views Right  11/09/2014   CLINICAL DATA:  Tripped on the ramp at the [employer] [place], RIGHT-sided pain post fall  EXAM: RIGHT KNEE - COMPLETE 4+ VIEW  COMPARISON:  05/26/2005  FINDINGS: Osseous demineralization.  Components of RIGHT knee prosthesis identified in expected positions.  No acute fracture, dislocation or bone destruction.  No periprosthetic lucency or knee joint effusion.  Deformity of the mid RIGHT femoral diaphysis subtly visualize at the cranial margin of the exam, noted on prior study.  Soft tissues unremarkable.  IMPRESSION: Post RIGHT  total knee arthroplasty.  Osseous demineralization.  Old RIGHT femoral diaphyseal fracture, incompletely visualized.  No acute bony abnormalities.   Electronically Signed   By: Ulyses Southward M.D.   On: 11/09/2014 17:18   Dg Femur, Min 2 Views Right  11/09/2014   CLINICAL DATA:  Fall  EXAM: RIGHT FEMUR 2 VIEWS  COMPARISON:  None.  FINDINGS: Osteopenia. There is deformity in the mid femur compatible with healed fracture and deformity. Osteopenia. No acute fracture. No dislocation. Total knee arthroplasty is in place.  IMPRESSION: No acute bony pathology.   Electronically Signed   By: Jolaine Click M.D.   On: 11/09/2014 17:16   I have personally reviewed and evaluated these images and lab results as part of my medical  decision-making.   MDM   Patient presents emergency department today with a mechanical fall from standing location while walking to the bank. Patient states that she landed on her right thigh was able catch her self but does believe that she may have hit her head. She had no loss of consciousness and is not currently on blood thinners after stopping her anticoagulation. Patient complains of right femur, right knee pain and right hip pain. Patient is already walking with cane but states she needed additional help walking. No neurological deficits.  Given age and unclear story of hitting her head, will obtain CT-head and CT-cervical spine.   Patient's x-ray shows no acute fractures. Patient has pain to lateral aspect of distal femur somewhat close to old fracture site. Orthopedics was consulted and patient to have walker for assistance. Patient will have follow up with orthopedics as needed.  Patient in agreement with plan and discharged home.    Final diagnoses:  Fall  Right leg pain      Deirdre Peer, MD 11/10/14 0206  Tilden Fossa, MD 11/11/14 1319

## 2014-11-17 ENCOUNTER — Ambulatory Visit: Payer: Medicare Other | Admitting: Internal Medicine

## 2014-11-17 DIAGNOSIS — Z0289 Encounter for other administrative examinations: Secondary | ICD-10-CM

## 2015-01-16 ENCOUNTER — Other Ambulatory Visit: Payer: Self-pay

## 2015-01-16 ENCOUNTER — Other Ambulatory Visit: Payer: Self-pay | Admitting: Internal Medicine

## 2015-01-16 DIAGNOSIS — E2839 Other primary ovarian failure: Secondary | ICD-10-CM

## 2016-04-08 ENCOUNTER — Ambulatory Visit: Payer: Medicare Other

## 2016-04-10 ENCOUNTER — Encounter (HOSPITAL_COMMUNITY): Payer: Self-pay | Admitting: Emergency Medicine

## 2016-04-10 ENCOUNTER — Emergency Department (HOSPITAL_COMMUNITY)
Admission: EM | Admit: 2016-04-10 | Discharge: 2016-04-11 | Disposition: A | Discharge status: Home / Self Care | Payer: Medicare Other | Attending: Emergency Medicine | Admitting: Emergency Medicine

## 2016-04-10 DIAGNOSIS — R0781 Pleurodynia: Secondary | ICD-10-CM | POA: Insufficient documentation

## 2016-04-10 DIAGNOSIS — Z96653 Presence of artificial knee joint, bilateral: Secondary | ICD-10-CM | POA: Insufficient documentation

## 2016-04-10 DIAGNOSIS — S0990XA Unspecified injury of head, initial encounter: Secondary | ICD-10-CM | POA: Diagnosis not present

## 2016-04-10 DIAGNOSIS — Y939 Activity, unspecified: Secondary | ICD-10-CM | POA: Insufficient documentation

## 2016-04-10 DIAGNOSIS — W010XXA Fall on same level from slipping, tripping and stumbling without subsequent striking against object, initial encounter: Secondary | ICD-10-CM | POA: Diagnosis not present

## 2016-04-10 DIAGNOSIS — M25551 Pain in right hip: Secondary | ICD-10-CM | POA: Diagnosis not present

## 2016-04-10 DIAGNOSIS — W19XXXA Unspecified fall, initial encounter: Secondary | ICD-10-CM

## 2016-04-10 DIAGNOSIS — Y999 Unspecified external cause status: Secondary | ICD-10-CM | POA: Diagnosis not present

## 2016-04-10 DIAGNOSIS — Y929 Unspecified place or not applicable: Secondary | ICD-10-CM | POA: Diagnosis not present

## 2016-04-10 DIAGNOSIS — I10 Essential (primary) hypertension: Secondary | ICD-10-CM | POA: Insufficient documentation

## 2016-04-10 DIAGNOSIS — M79672 Pain in left foot: Secondary | ICD-10-CM | POA: Diagnosis not present

## 2016-04-10 DIAGNOSIS — M79671 Pain in right foot: Secondary | ICD-10-CM | POA: Insufficient documentation

## 2016-04-10 MED ORDER — FENTANYL CITRATE (PF) 100 MCG/2ML IJ SOLN
50.0000 ug | INTRAMUSCULAR | Status: DC | PRN
  Administered 2016-04-11: 50 ug via INTRAVENOUS
  Filled 2016-04-10: qty 2

## 2016-04-10 MED ORDER — ONDANSETRON HCL 4 MG/2ML IJ SOLN
4.0000 mg | Freq: Once | INTRAMUSCULAR | Status: AC
  Administered 2016-04-11: 4 mg via INTRAVENOUS
  Filled 2016-04-10: qty 2

## 2016-04-10 NOTE — ED Provider Notes (Signed)
[facility] Provider Note   CSN: [id number] Arrival date & time: 04/10/16  2239     History   Chief Complaint Chief Complaint  Patient presents with  . Fall  . Hip Pain    HPI Theresa Bauer is a 83 y.o. female.  Theresa Bauer is a 83 y.o. Female who presents to the emergency department via EMS after a trip and fall that occurred around 5 hours prior to evaluation today. Patient reports she tripped in the house falling onto concrete that is covered by carpet. She fell backwards landing on the back of her head on the right side of her body. She complains of pain to her right lateral upper back, her right hip, her bilateral feet. She took some unknown medication prior to arrival with little relief. She denies loss of consciousness. She is on Plavix. She tells me she cannot walk due to the pain in her feet. She denies recent illness, fevers, chest pain, shortness of breath, lightheadedness, dizziness, numbness, tingling, weakness, abdominal pain, nausea, vomiting, or changes to her vision.   The history is provided by the patient, medical records and a relative. No language interpreter was used.  Fall  Pertinent negatives include no chest pain, no abdominal pain, no headaches and no shortness of breath.  Hip Pain  Pertinent negatives include no chest pain, no abdominal pain, no headaches and no shortness of breath.    Past Medical History:  Diagnosis Date  . GERD (gastroesophageal reflux disease)   . Glaucoma   . Hypertension     Patient Active Problem List   Diagnosis Date Noted  . Osteoporosis, unspecified 09/30/2012  . Branch retinal artery occlusion, left eye 09/30/2012  . HYPERTENSION 04/05/2010  . VENOUS INSUFFICIENCY, CHRONIC 04/05/2010  . GERD 04/05/2010  . CHEST PAIN 04/05/2010    Past Surgical History:  Procedure Laterality Date  . REPLACEMENT TOTAL KNEE BILATERAL  2007/2008    OB History    Gravida Para Term Preterm AB Living   5 5 5  0 0     SAB TAB  Ectopic Multiple Live Births                   Home Medications    Prior to Admission medications   Medication Sig Start Date End Date Taking? Authorizing Provider  acetaminophen (TYLENOL) 325 MG tablet Take 2 tablets (650 mg total) by mouth every 6 (six) hours as needed for mild pain or moderate pain. 04/11/16   Everlene Farrier, PA-C  alendronate (FOSAMAX) 70 MG tablet Take 70 mg by mouth every 7 (seven) days. Take with a full glass of water on an empty stomach.    Historical Provider, MD  amLODipine (NORVASC) 5 MG tablet Take 5 mg by mouth daily.    Historical Provider, MD  clopidogrel (PLAVIX) 75 MG tablet Take 1 tablet (75 mg total) by mouth daily. 10/02/12   Laveda Norman, MD  doxycycline (VIBRAMYCIN) 100 MG capsule Take 1 capsule (100 mg total) by mouth 2 (two) times daily. 04/06/14   Sherren Kerns, MD  hydrochlorothiazide (HYDRODIURIL) 12.5 MG tablet Take 12.5 mg by mouth daily.    Historical Provider, MD  loratadine (CLARITIN) 10 MG tablet Take 10 mg by mouth daily.    Historical Provider, MD  Multiple Vitamins-Minerals (MULTIVITAMINS THER. W/MINERALS) TABS tablet Take 1 tablet by mouth daily.    Historical Provider, MD  Omega-3 Fatty Acids (FISH OIL PO) Take by mouth daily.    Historical  Provider, MD  pantoprazole (PROTONIX) 40 MG tablet Take 1 tablet (40 mg total) by mouth daily. 10/02/12   Laveda Norman, MD  Travoprost, BAK Free, (TRAVATAN) 0.004 % SOLN ophthalmic solution Place 1 drop into both eyes at bedtime.    Historical Provider, MD  triamcinolone cream (KENALOG) 0.5 % Apply 1 application topically 3 (three) times daily.    Historical Provider, MD    Family History Family History  Problem Relation Age of Onset  . Heart attack Mother   . Heart attack Father   . Osteoarthritis Father     Social History Social History  Substance Use Topics  . Smoking status: Never Smoker  . Smokeless tobacco: Never Used  . Alcohol use No     Allergies   Lisinopril; Darvocet  [propoxyphene n-acetaminophen]; Aspirin; and Penicillins   Review of Systems Review of Systems  Constitutional: Negative for chills and fever.  HENT: Negative for congestion and sore throat.   Eyes: Negative for visual disturbance.  Respiratory: Negative for cough and shortness of breath.   Cardiovascular: Negative for chest pain.  Gastrointestinal: Negative for abdominal pain, diarrhea, nausea and vomiting.  Genitourinary: Negative for dysuria.  Musculoskeletal: Positive for arthralgias and neck pain. Negative for back pain.  Skin: Negative for rash.  Neurological: Negative for dizziness, syncope, weakness, light-headedness, numbness and headaches.     Physical Exam Updated Vital Signs BP (!) 130/53   Pulse 89   Temp 98.1 F (36.7 C) (Oral)   Resp 16   SpO2 100%   Physical Exam  Constitutional: She is oriented to person, place, and time. She appears well-developed and well-nourished. No distress.  Nontoxic appearing.  HENT:  Head: Normocephalic and atraumatic.  Right Ear: External ear normal.  Left Ear: External ear normal.  Mouth/Throat: Oropharynx is clear and moist.  No visible or palpated signs of head trauma. Bilateral tympanic membranes are pearly-gray without erythema or loss of landmarks.   Eyes: Conjunctivae and EOM are normal. Pupils are equal, round, and reactive to light. Right eye exhibits no discharge. Left eye exhibits no discharge.  Neck: Normal range of motion. Neck supple. No JVD present. No tracheal deviation present.  Mild right lateral neck tenderness palpation. No midline neck tenderness palpation. No neck crepitus or deformity.  Cardiovascular: Normal rate, regular rhythm, normal heart sounds and intact distal pulses.  Exam reveals no gallop and no friction rub.   No murmur heard. Bilateral radial, posterior tibialis and dorsalis pedis pulses are intact.    Pulmonary/Chest: Effort normal and breath sounds normal. No stridor. No respiratory distress.  She has no wheezes. She has no rales. She exhibits tenderness.  Lungs clear to auscultation bilaterally. Symmetric chest expansion bilaterally. Mild right lateral posterior chest wall tenderness to palpation. No crepitus.  Abdominal: Soft. There is no tenderness. There is no guarding.  Musculoskeletal: Normal range of motion. She exhibits tenderness. She exhibits no edema or deformity.  No midline neck or back tenderness. Mild tenderness over her lateral hip. No overlying skin changes. No crepitus. No rotation or shortening of her legs bilaterally. No long bone tenderness to her bilateral lower extremities. Mild tenderness to the dorsum of her bilateral feet, worse on the right side. No tenderness over her achilles tendon bilaterally.  No ankle instability bilaterally. Patient's bilateral wrists, elbow, shoulder, and knee joints are supple and nontender to palpation.  Lymphadenopathy:    She has no cervical adenopathy.  Neurological: She is alert and oriented to person, place, and time. No  cranial nerve deficit or sensory deficit. She exhibits normal muscle tone. Coordination normal.  Patient is alert and oriented 3. Cranial nerves are intact. Speech is clear and coherent. No pronator drift. Finger-to-nose intact bilaterally. Sensation is intact to her bilateral upper and lower extremities.  Skin: Skin is warm and dry. Capillary refill takes less than 2 seconds. No rash noted. She is not diaphoretic. No erythema. No pallor.  Psychiatric: She has a normal mood and affect. Her behavior is normal.  Nursing note and vitals reviewed.    ED Treatments / Results  Labs (all labs ordered are listed, but only abnormal results are displayed) Labs Reviewed  TYPE AND SCREEN    EKG  EKG Interpretation None       Radiology Dg Ribs Unilateral W/chest Right  Result Date: 04/11/2016 CLINICAL DATA:  Right hip and flank pain after falling at 16:00 today EXAM: RIGHT RIBS AND CHEST - 3+ VIEW COMPARISON:   11/09/2014 FINDINGS: No fracture or other bone lesions are seen involving the ribs. There is no evidence of pneumothorax or pleural effusion. Both lungs are clear. Heart size and mediastinal contours are within normal limits. IMPRESSION: Negative. Electronically Signed   By: Ellery Plunkaniel R Mitchell M.D.   On: 04/11/2016 00:58   Dg Pelvis 1-2 Views  Result Date: 04/11/2016 CLINICAL DATA:  Initial evaluation for acute trauma, fall, right hip pain. EXAM: PELVIS - 1-2 VIEW COMPARISON:  Prior radiograph from 11/09/2014. FINDINGS: Bones are diffusely osteopenic, somewhat limiting evaluation for possible subtle acute nondisplaced fractures. No acute fracture or dislocation identified. Femoral heads in normal line within the acetabula. Femoral head heights preserved. Bony pelvis intact. SI joints approximated. No acute soft tissue abnormality. Degenerative changes noted within the lower lumbar spine. Degenerative osteoarthritic changes about the hips bilaterally is well. IMPRESSION: 1. No acute fracture or dislocation identified. 2. Osteopenia. Electronically Signed   By: Rise MuBenjamin  McClintock M.D.   On: 04/11/2016 00:59   Ct Head Wo Contrast  Result Date: 04/11/2016 CLINICAL DATA:  Pain after fall EXAM: CT HEAD WITHOUT CONTRAST CT CERVICAL SPINE WITHOUT CONTRAST TECHNIQUE: Multidetector CT imaging of the head and cervical spine was performed following the standard protocol without intravenous contrast. Multiplanar CT image reconstructions of the cervical spine were also generated. COMPARISON:  11/09/2014 FINDINGS: CT HEAD FINDINGS Brain: Chronic appearing small vessel ischemic disease of periventricular and subcortical white matter. No acute large vascular territory infarction, hemorrhage or midline shift. No intra-axial mass nor extra-axial fluid. Vascular: Moderate calcifications of both carotid siphons. Skull: Normal. Negative for fracture or focal lesion. Sinuses/Orbits: Right maxillary sinus wall thickening with  chronic appearing inspissated mucus consistent chronic sinusitis. The left maxillary sinus, sphenoid and ethmoid sinuses are clear. The frontal sinus is nonacute. Orbits are unremarkable. Other: None. CT CERVICAL SPINE FINDINGS Alignment: The atlantodental interval and craniocervical relationship are maintained. Skull base and vertebrae: No acute fracture. No primary bone lesion or focal pathologic process. Soft tissues and spinal canal: No prevertebral fluid or swelling. No visible canal hematoma. Disc levels: Severe degenerate disc disease at C3-4 with disc space narrowing and slight retrolisthesis of C3 on C4, chronic in appearance. Right-sided neural foraminal encroachment from uncovertebral spurring and facet arthropathy. Moderate-to-marked degenerative disc disease at C5-6 and C7-T1 are stable with discogenic sclerosis of the endplates and small posterior marginal osteophytes at C4-5 causing mild right-sided neural foraminal encroachment. There is uncovertebral joint osteoarthritis bilaterally at C3-4, C4-5, C5-6 and C6-7. Upper chest: Negative. Other: None IMPRESSION: Chronic stable small vessel ischemic disease  of periventricular white matter. No acute intracranial pathology. Cervical spondylosis without acute fracture or traumatic subluxations. Electronically Signed   By: Tollie Eth M.D.   On: 04/11/2016 00:37   Ct Cervical Spine Wo Contrast  Result Date: 04/11/2016 CLINICAL DATA:  Pain after fall EXAM: CT HEAD WITHOUT CONTRAST CT CERVICAL SPINE WITHOUT CONTRAST TECHNIQUE: Multidetector CT imaging of the head and cervical spine was performed following the standard protocol without intravenous contrast. Multiplanar CT image reconstructions of the cervical spine were also generated. COMPARISON:  11/09/2014 FINDINGS: CT HEAD FINDINGS Brain: Chronic appearing small vessel ischemic disease of periventricular and subcortical white matter. No acute large vascular territory infarction, hemorrhage or midline  shift. No intra-axial mass nor extra-axial fluid. Vascular: Moderate calcifications of both carotid siphons. Skull: Normal. Negative for fracture or focal lesion. Sinuses/Orbits: Right maxillary sinus wall thickening with chronic appearing inspissated mucus consistent chronic sinusitis. The left maxillary sinus, sphenoid and ethmoid sinuses are clear. The frontal sinus is nonacute. Orbits are unremarkable. Other: None. CT CERVICAL SPINE FINDINGS Alignment: The atlantodental interval and craniocervical relationship are maintained. Skull base and vertebrae: No acute fracture. No primary bone lesion or focal pathologic process. Soft tissues and spinal canal: No prevertebral fluid or swelling. No visible canal hematoma. Disc levels: Severe degenerate disc disease at C3-4 with disc space narrowing and slight retrolisthesis of C3 on C4, chronic in appearance. Right-sided neural foraminal encroachment from uncovertebral spurring and facet arthropathy. Moderate-to-marked degenerative disc disease at C5-6 and C7-T1 are stable with discogenic sclerosis of the endplates and small posterior marginal osteophytes at C4-5 causing mild right-sided neural foraminal encroachment. There is uncovertebral joint osteoarthritis bilaterally at C3-4, C4-5, C5-6 and C6-7. Upper chest: Negative. Other: None IMPRESSION: Chronic stable small vessel ischemic disease of periventricular white matter. No acute intracranial pathology. Cervical spondylosis without acute fracture or traumatic subluxations. Electronically Signed   By: Tollie Eth M.D.   On: 04/11/2016 00:37   Dg Foot Complete Left  Result Date: 04/11/2016 CLINICAL DATA:  Patient fell at 1600 hours today.  Pain. EXAM: LEFT FOOT - COMPLETE 3+ VIEW COMPARISON:  None. FINDINGS: There is no evidence of fracture nor bone destruction. The bones are slightly osteopenic in appearance. Degenerative joint space narrowing of the DIP and PIP joints of the second through fifth digits,  interphalangeal joint of the great toe and first MTP. Slight soft tissue swelling over the dorsum of the midfoot with degenerative spurring across the navicular cuneiform articulation. Dorsal calcaneal enthesophyte is seen. IMPRESSION: No acute osseous appearing abnormality. Osteoarthritis of the toes and midfoot. Calcaneal enthesophyte along the dorsal aspect. Electronically Signed   By: Tollie Eth M.D.   On: 04/11/2016 01:01   Dg Foot Complete Right  Result Date: 04/11/2016 CLINICAL DATA:  Patient fell.  Pain. EXAM: RIGHT FOOT COMPLETE - 3+ VIEW COMPARISON:  None. FINDINGS: Mild osteopenia. Osteoarthritic joint space narrowing of the DIP and PIP joints of the second through fifth digits, interphalangeal joint of the great toe and first MTP. No acute fracture. Osteoarthritis across the dorsum of the midfoot at the level of the calcaneal navicular articulation. Tiny plantar and dorsal calcaneal enthesophytes. IMPRESSION: No acute osseous abnormality of the right foot. Osteoarthritis and calcaneal enthesophytes as above described. Electronically Signed   By: Tollie Eth M.D.   On: 04/11/2016 01:02   Dg Femur, Min 2 Views Right  Result Date: 04/11/2016 CLINICAL DATA:  Hip pain after fall EXAM: RIGHT FEMUR 2 VIEWS COMPARISON:  11/09/2014 pelvic radiograph FINDINGS: Healed fracture  deformity of the femoral shaft at junction of the middle and distal third. Partially imaged right knee arthroplasty appears intact. Osteoarthritic spurring off the femoral head is stable with slight joint space narrowing of both hips visualized. There is lower lumbar degenerative facet hypertrophy and sclerosis. Osteoarthritis of both sacroiliac joints. Pubic symphysis and pubic rami appear intact. No acute fracture of the right femur. IMPRESSION: Negative for acute fracture. Osteoarthritis of the right hip. Intact partially imaged right knee arthroplasty. Old fracture deformity with healing of the femoral shaft at the junction of the  middle and distal third. Electronically Signed   By: Tollie Eth M.D.   On: 04/11/2016 01:06    Procedures Procedures (including critical care time)  Medications Ordered in ED Medications  fentaNYL (SUBLIMAZE) injection 50 mcg (50 mcg Intravenous Given 04/11/16 0105)  acetaminophen (TYLENOL) tablet 650 mg (not administered)  fentaNYL (SUBLIMAZE) injection 50 mcg (not administered)  ondansetron (ZOFRAN) injection 4 mg (4 mg Intravenous Given 04/11/16 0105)     Initial Impression / Assessment and Plan / ED Course  I have reviewed the triage vital signs and the nursing notes.  Pertinent labs & imaging results that were available during my care of the patient were reviewed by me and considered in my medical decision making (see chart for details).    This is a 83 y.o. Female who presents to the emergency department via EMS after a trip and fall that occurred around 5 hours prior to evaluation today. Patient reports she tripped in the house falling onto concrete that is covered by carpet. She fell backwards landing on the back of her head on the right side of her body. She complains of pain to her right lateral upper back, her right hip, her bilateral feet. She took some unknown medication prior to arrival with little relief. She denies loss of consciousness. She is on Plavix. She tells me she cannot walk due to the pain in her feet.  On examination his afebrile nontoxic appearing. She has no focal neurological deficits. She has tenderness over her right lateral lower ribs, without crepitus or deformity. She has mild tenderness over her right lateral hip and the dorsum of her bilateral feet. X-rays were obtained of her right RIBS with chest, her left foot, her right foot, and her right hip with pelvis all which showed no acute findings. CT head and neck without contrast showed no acute findings. Reevaluation patient reports feeling better after fentanyl. She is able to cannulate with assistance with  myself. She usually uses a cane at home. She still complains of some pain to her right foot with walking. No findings on my exam. No findings on x-ray. I advised if her pain persists she should have a repeat x-ray in about a week. Tylenol for use of pain at home. I also provided her with prescription for walker for home use. Daughter will stay with her at home. I advised the patient to follow-up with their primary care provider this week. I advised the patient to return to the emergency department with new or worsening symptoms or new concerns. The patient and her daughter verbalized understanding and agreement with plan.      Final Clinical Impressions(s) / ED Diagnoses   Final diagnoses:  Fall, initial encounter  Rib pain on right side  Minor head injury, initial encounter  Right hip pain  Right foot pain  Left foot pain    New Prescriptions New Prescriptions   ACETAMINOPHEN (TYLENOL) 325 MG TABLET  Take 2 tablets (650 mg total) by mouth every 6 (six) hours as needed for mild pain or moderate pain.     Everlene Farrier, PA-C 04/11/16 0149    Gilda Crease, MD 04/11/16 [phone]

## 2016-04-10 NOTE — ED Triage Notes (Signed)
Per EMS, pt from home with c/o a fall and right hip pain. Fall occurred at 1600 today, pt able to get herself up and back to her chair, denies hitting her head and LOC. Pt c/o right hip pain, left flank pain and neck pain. C-collar in place upon arrival. EMS noted mild shortening to the right leg. BP-150/70, HR-102

## 2016-04-10 NOTE — ED Notes (Signed)
Patient transported to CT 

## 2016-04-11 ENCOUNTER — Emergency Department (HOSPITAL_COMMUNITY): Payer: Medicare Other

## 2016-04-11 DIAGNOSIS — S0990XA Unspecified injury of head, initial encounter: Secondary | ICD-10-CM | POA: Diagnosis not present

## 2016-04-11 LAB — TYPE AND SCREEN
ABO/RH(D): O POS
ANTIBODY SCREEN: NEGATIVE

## 2016-04-11 MED ORDER — FENTANYL CITRATE (PF) 100 MCG/2ML IJ SOLN
50.0000 ug | Freq: Once | INTRAMUSCULAR | Status: AC
  Administered 2016-04-11: 50 ug via INTRAVENOUS
  Filled 2016-04-11: qty 2

## 2016-04-11 MED ORDER — ACETAMINOPHEN 325 MG PO TABS
650.0000 mg | ORAL_TABLET | Freq: Once | ORAL | Status: AC
  Administered 2016-04-11: 650 mg via ORAL
  Filled 2016-04-11: qty 2

## 2016-04-11 MED ORDER — ACETAMINOPHEN 325 MG PO TABS: 650.0000 mg | ORAL_TABLET | Freq: Four times a day (QID) | ORAL | 0 refills | Status: DC | PRN

## 2016-04-11 NOTE — ED Notes (Signed)
Nurse currently starting IV and drawing labs. 

## 2016-04-11 NOTE — ED Notes (Signed)
Delay in lab draw,  Pt not in room at this time. 

## 2016-04-11 NOTE — ED Notes (Signed)
CALLED [facility] FOR TRANSPORT HOME--Theresa Bauer  

## 2016-04-11 NOTE — ED Notes (Signed)
Pt verbalized understanding discharge instructions and denies any further needs or questions at this time. VS stable, 

## 2016-05-01 DIAGNOSIS — H25811 Combined forms of age-related cataract, right eye: Secondary | ICD-10-CM | POA: Insufficient documentation

## 2016-08-21 ENCOUNTER — Ambulatory Visit (HOSPITAL_COMMUNITY)
Admission: EM | Admit: 2016-08-21 | Discharge: 2016-08-21 | Disposition: A | Discharge status: Home / Self Care | Payer: Medicare Other | Attending: Family Medicine | Admitting: Family Medicine

## 2016-08-21 ENCOUNTER — Encounter (HOSPITAL_COMMUNITY): Payer: Self-pay

## 2016-08-21 DIAGNOSIS — M25552 Pain in left hip: Secondary | ICD-10-CM

## 2016-08-21 DIAGNOSIS — M545 Low back pain: Secondary | ICD-10-CM

## 2016-08-21 DIAGNOSIS — G8929 Other chronic pain: Secondary | ICD-10-CM

## 2016-08-21 MED ORDER — HYDROCODONE-ACETAMINOPHEN 2.5-325 MG PO TABS: ORAL_TABLET | ORAL | 0 refills | Status: DC

## 2016-08-21 MED ORDER — CELECOXIB 100 MG PO CAPS: 100.0000 mg | ORAL_CAPSULE | Freq: Two times a day (BID) | ORAL | 0 refills | Status: DC

## 2016-08-21 NOTE — ED Triage Notes (Signed)
Pt having left hip pain radiating down her leg to her feet. Having trouble sleeping at night. Said the last time she fell was in march and they did xray and it was normal. Taking Tylenol #3 without relief.

## 2016-08-25 NOTE — ED Provider Notes (Signed)
  [facility]   [id number] 08/21/16 Arrival Time: 1755  ASSESSMENT & PLAN:  1. Left hip pain   2. Chronic left-sided low back pain, with sciatica presence unspecified    Meds ordered this encounter  Medications  . celecoxib (CELEBREX) 100 MG capsule    Sig: Take 1 capsule (100 mg total) by mouth 2 (two) times daily.    Dispense:  14 capsule    Refill:  0  . Hydrocodone-Acetaminophen 2.5-325 MG TABS    Sig: Take one before bed as needed for pain relief.    Dispense:  10 tablet    Refill:  0   Medication sedation precaution. Discussed f/u with her PCP or an orthopaedist. Might benefit from PT for MSK discomfort.  Reviewed expectations re: course of current medical issues. Questions answered. Outlined signs and symptoms indicating need for more acute intervention. Patient verbalized understanding. After Visit Summary given.   SUBJECTIVE:  Theresa Bauer is a 83 y.o. female who presents with complaint of left hip and lower extremity discomfort. Long-standing issue. On/off exacerbations. Has seen other providers. No recent trauma or injury. Tylenol without much help. Ambulatory but discomfort worse with movement. Occasionally wakes her from sleep. No bowel or bladder habit changes. No LE sensation changes. Occasional lower back discomfort.  ROS: As per HPI.   OBJECTIVE:  Vitals:   08/21/16 1846  BP: (!) 162/75  Pulse: 68  Resp: 18  Temp: 98.1 F (36.7 C)  TempSrc: Oral  SpO2: 97%     General appearance: alert; no distress Back: no midline lumbar tenderness Extremities: LLE with generalized mild tenderness laterally over hip; FROM at knee; no edema or swelling Skin: warm and dry Neurologic: normal sensation in LE bilat   Allergies  Allergen Reactions  . Lisinopril Anaphylaxis  . Darvocet [Propoxyphene N-Acetaminophen] Itching  . Aspirin     itching  . Penicillins     itching    PMHx, SurgHx, SocialHx, Medications, and Allergies were reviewed in the  Visit Navigator and updated as appropriate.      Mardella LaymanHagler, Aum Caggiano, MD 08/25/16 [phone]

## 2016-09-09 ENCOUNTER — Encounter (HOSPITAL_COMMUNITY): Payer: Self-pay | Admitting: Emergency Medicine

## 2016-09-09 ENCOUNTER — Emergency Department (HOSPITAL_COMMUNITY): Payer: Medicare Other

## 2016-09-09 ENCOUNTER — Emergency Department (HOSPITAL_COMMUNITY)
Admission: EM | Admit: 2016-09-09 | Discharge: 2016-09-09 | Disposition: A | Discharge status: Home / Self Care | Payer: Medicare Other | Attending: Emergency Medicine | Admitting: Emergency Medicine

## 2016-09-09 DIAGNOSIS — M25562 Pain in left knee: Secondary | ICD-10-CM | POA: Diagnosis not present

## 2016-09-09 DIAGNOSIS — Y939 Activity, unspecified: Secondary | ICD-10-CM | POA: Insufficient documentation

## 2016-09-09 DIAGNOSIS — Y92003 Bedroom of unspecified non-institutional (private) residence as the place of occurrence of the external cause: Secondary | ICD-10-CM | POA: Diagnosis not present

## 2016-09-09 DIAGNOSIS — M79622 Pain in left upper arm: Secondary | ICD-10-CM | POA: Diagnosis not present

## 2016-09-09 DIAGNOSIS — Y999 Unspecified external cause status: Secondary | ICD-10-CM | POA: Diagnosis not present

## 2016-09-09 DIAGNOSIS — M25552 Pain in left hip: Secondary | ICD-10-CM | POA: Insufficient documentation

## 2016-09-09 DIAGNOSIS — M79605 Pain in left leg: Secondary | ICD-10-CM

## 2016-09-09 DIAGNOSIS — M79672 Pain in left foot: Secondary | ICD-10-CM | POA: Insufficient documentation

## 2016-09-09 DIAGNOSIS — W19XXXA Unspecified fall, initial encounter: Secondary | ICD-10-CM

## 2016-09-09 DIAGNOSIS — W06XXXA Fall from bed, initial encounter: Secondary | ICD-10-CM | POA: Insufficient documentation

## 2016-09-09 DIAGNOSIS — I10 Essential (primary) hypertension: Secondary | ICD-10-CM | POA: Insufficient documentation

## 2016-09-09 DIAGNOSIS — Y92009 Unspecified place in unspecified non-institutional (private) residence as the place of occurrence of the external cause: Secondary | ICD-10-CM

## 2016-09-09 MED ORDER — FENTANYL CITRATE (PF) 100 MCG/2ML IJ SOLN
50.0000 ug | Freq: Once | INTRAMUSCULAR | Status: AC
  Administered 2016-09-09: 50 ug via INTRAMUSCULAR
  Filled 2016-09-09: qty 2

## 2016-09-09 NOTE — ED Notes (Signed)
Patient transported to X-ray 

## 2016-09-09 NOTE — ED Notes (Signed)
Bed: [id number] Expected date:  Expected time:  Means of arrival:  Comments: 83 yr old fall, hip pain, rt leg shortening

## 2016-09-09 NOTE — ED Notes (Signed)
Pt. and family just told this Nurse that they wanted  [facility] for transport back to pt.'s residence. To call [facility].

## 2016-09-09 NOTE — ED Triage Notes (Signed)
Per EMS, pt. From home with complaint of fall and hip pain at 10/10. Pt. Stated that she fell at 0600 am yesterday from her bed, denied LOC, complaint of left hip pain . Pt stated that she was still able to move around and was just taking tylenol for pain which it helped until this evening that pain was not relief by OTC med. EMS reported of obvious shortening of right leg . Pt. Denied taking blood thinner.

## 2016-09-09 NOTE — ED Notes (Signed)
ED Provider at bedside. 

## 2016-09-09 NOTE — ED Provider Notes (Signed)
[facility]-EMERGENCY DEPT Provider Note   CSN: [id number] Arrival date & time: 09/09/16  0025  Time seen 12:46 AM    History   Chief Complaint Chief Complaint  Patient presents with  . Fall  . Leg Injury    HPI Theresa Bauer is a 83 y.o. female.  HPI patient reports she was sleeping earlier this morning on August 6 and she fell out of bed landing on her left side. She complains of pain in her left hip and states it's very painful to walk. However she did not mention that she has chronic pain in her left hip. She had recently been seen on July 17 at urgent care for the same. She also complains of pain in her left upper arm, her left knee, and her left foot and left great toe. She denies having any pain in her head or having any neck pain. She states she was able to get herself off the floor this morning although it took her a long time and she has been ambulatory with pain and difficulty during the day today. Her daughter came by to see her tonight and brought her to the ED.  PCP Fleet ContrasAvbuere, Edwin, MD   Past Medical History:  Diagnosis Date  . GERD (gastroesophageal reflux disease)   . Glaucoma   . Hypertension     Patient Active Problem List   Diagnosis Date Noted  . Osteoporosis, unspecified 09/30/2012  . Branch retinal artery occlusion, left eye 09/30/2012  . HYPERTENSION 04/05/2010  . VENOUS INSUFFICIENCY, CHRONIC 04/05/2010  . GERD 04/05/2010  . CHEST PAIN 04/05/2010    Past Surgical History:  Procedure Laterality Date  . REPLACEMENT TOTAL KNEE BILATERAL  2007/2008    OB History    Gravida Para Term Preterm AB Living   5 5 5  0 0     SAB TAB Ectopic Multiple Live Births                   Home Medications    Prior to Admission medications   Medication Sig Start Date End Date Taking? Authorizing Provider  acetaminophen (TYLENOL) 325 MG tablet Take 2 tablets (650 mg total) by mouth every 6 (six) hours as needed for mild pain or moderate pain. 04/11/16   Everlene Farrieransie, William,  PA-C  alendronate (FOSAMAX) 70 MG tablet Take 70 mg by mouth every 7 (seven) days. Take with a full glass of water on an empty stomach.    [provider]  amLODipine (NORVASC) 5 MG tablet Take 5 mg by mouth daily.    [provider]  celecoxib (CELEBREX) 100 MG capsule Take 1 capsule (100 mg total) by mouth 2 (two) times daily. 08/21/16   Mardella LaymanHagler, Brian, MD  clopidogrel (PLAVIX) 75 MG tablet Take 1 tablet (75 mg total) by mouth daily. 10/02/12   Laveda Normanti, Chris N, MD  doxycycline (VIBRAMYCIN) 100 MG capsule Take 1 capsule (100 mg total) by mouth 2 (two) times daily. 04/06/14   Sherren KernsFields, Charles E, MD  hydrochlorothiazide (HYDRODIURIL) 12.5 MG tablet Take 12.5 mg by mouth daily.    [provider]  Hydrocodone-Acetaminophen 2.5-325 MG TABS Take one before bed as needed for pain relief. 08/21/16   Mardella LaymanHagler, Brian, MD  loratadine (CLARITIN) 10 MG tablet Take 10 mg by mouth daily.    [provider]  Multiple Vitamins-Minerals (MULTIVITAMINS THER. W/MINERALS) TABS tablet Take 1 tablet by mouth daily.    [provider]  Omega-3 Fatty Acids (FISH OIL PO) Take by  mouth daily.    [provider]  pantoprazole (PROTONIX) 40 MG tablet Take 1 tablet (40 mg total) by mouth daily. 10/02/12   Laveda Norman, MD  Travoprost, BAK Free, (TRAVATAN) 0.004 % SOLN ophthalmic solution Place 1 drop into both eyes at bedtime.    [provider]  triamcinolone cream (KENALOG) 0.5 % Apply 1 application topically 3 (three) times daily.    [provider]    Family History Family History  Problem Relation Age of Onset  . Heart attack Mother   . Heart attack Father   . Osteoarthritis Father     Social History Social History  Substance Use Topics  . Smoking status: Never Smoker  . Smokeless tobacco: Never Used  . Alcohol use No  lives alone   Allergies   Lisinopril; Darvocet [propoxyphene n-acetaminophen]; Aspirin; and Penicillins   Review of  Systems Review of Systems  All other systems reviewed and are negative.    Physical Exam Updated Vital Signs BP (!) 144/58 (BP Location: Left Arm)   Pulse 84   Temp 98.1 F (36.7 C) (Oral)   Resp 14   Ht 5' (1.524 m)   Wt 61.2 kg (135 lb)   SpO2 96%   BMI 26.37 kg/m   Vital signs normal    Physical Exam  Constitutional: She is oriented to person, place, and time. She appears well-developed and well-nourished.  Non-toxic appearance. She does not appear ill. No distress.  HENT:  Head: Normocephalic and atraumatic.  Right Ear: External ear normal.  Left Ear: External ear normal.  Nose: Nose normal. No mucosal edema or rhinorrhea.  Mouth/Throat: Oropharynx is clear and moist and mucous membranes are normal. No dental abscesses or uvula swelling.  Eyes: Pupils are equal, round, and reactive to light. Conjunctivae and EOM are normal.  Neck: Normal range of motion and full passive range of motion without pain. Neck supple.  Cardiovascular: Normal rate, regular rhythm and normal heart sounds.  Exam reveals no gallop and no friction rub.   No murmur heard. Pulmonary/Chest: Effort normal and breath sounds normal. No respiratory distress. She has no wheezes. She has no rhonchi. She has no rales. She exhibits no tenderness and no crepitus.  Abdominal: Soft. Normal appearance and bowel sounds are normal. She exhibits no distension. There is no tenderness. There is no rebound and no guarding.  Musculoskeletal: Normal range of motion. She exhibits no edema or tenderness.  Moves all extremities well. Tender is her left upper arm but has ROM at the shoulder. Nontender in her left elbow, forearm, wrist, hand. Patient is tender in her left posterior hip area and over her left superior iliac crest laterally. She is tender in her left knee and has had a left knee replacement. There is no obvious swelling or deformity seen. She's also tender in her left ankle and diffusely in her left foot and great  toe. There is no obvious swelling seen in these areas.  Neurological: She is alert and oriented to person, place, and time. She has normal strength. No cranial nerve deficit.  Skin: Skin is warm, dry and intact. No rash noted. No erythema. No pallor.  Psychiatric: She has a normal mood and affect. Her speech is normal and behavior is normal. Her mood appears not anxious.  Nursing note and vitals reviewed.    ED Treatments / Results  Labs (all labs ordered are listed, but only abnormal results are displayed) Labs Reviewed - No data to display  EKG  EKG Interpretation None       Radiology Dg Ankle Complete Left  Result Date: 09/09/2016 CLINICAL DATA:  Status post fall, with left lateral ankle pain and swelling. Initial encounter. EXAM: LEFT ANKLE COMPLETE - 3+ VIEW COMPARISON:  None. FINDINGS: There is no evidence of fracture or dislocation. The ankle mortise is intact; the interosseous space is within normal limits. No talar tilt or subluxation is seen. The joint spaces are preserved. No significant soft tissue abnormalities are seen. IMPRESSION: No evidence of fracture or dislocation. Electronically Signed   By: Roanna Raider M.D.   On: 09/09/2016 01:51   Ct Hip Left Wo Contrast  Result Date: 09/09/2016 CLINICAL DATA:  Patient fell at home with worsening of chronic left hip pain EXAM: CT OF THE LEFT HIP WITHOUT CONTRAST TECHNIQUE: Multidetector CT imaging of the left hip was performed according to the standard protocol. Multiplanar CT image reconstructions were also generated. COMPARISON:  None. FINDINGS: Bones/Joint/Cartilage No fracture or dislocation of the left hip. Degenerative subchondral cystic change of the acetabular roof. Ligaments Suboptimally assessed by CT. Muscles and Tendons No intramuscular hemorrhage. Soft tissues Injection granulomata of the buttock. No hematoma or abnormal fluid collections. The included bladder, uterus and adnexa are unremarkable. IMPRESSION:  Degenerative subchondral cystic change of the left acetabulum. No acute fracture nor dislocation of the left hip. Electronically Signed   By: Tollie Eth M.D.   On: 09/09/2016 03:18   Dg Knee Complete 4 Views Left  Result Date: 09/09/2016 CLINICAL DATA:  Status post fall, with left knee pain. Initial encounter. EXAM: LEFT KNEE - COMPLETE 4+ VIEW COMPARISON:  Left knee radiographs performed 09/16/2006 FINDINGS: There is no evidence of fracture or dislocation. The total knee arthroplasty appears grossly intact, without evidence of loosening. No significant joint effusion is seen. The visualized soft tissues are normal in appearance. IMPRESSION: 1. No evidence of fracture or dislocation. 2. Total knee arthroplasty appears grossly intact, without evidence of loosening. Electronically Signed   By: Roanna Raider M.D.   On: 09/09/2016 01:50   Dg Humerus Left  Result Date: 09/09/2016 CLINICAL DATA:  Status post fall, with left arm pain. Initial encounter. EXAM: LEFT HUMERUS - 2+ VIEW COMPARISON:  None. FINDINGS: There is no evidence of fracture or dislocation. The left humerus appears grossly intact. The left humeral head remains seated at the glenoid fossa. The left acromioclavicular joint appears intact. The left elbow joint demonstrates mild sclerosis and cortical irregularity. Mild cortical irregularity at the proximal radius may reflect remote injury. IMPRESSION: No evidence of acute fracture or dislocation. Electronically Signed   By: Roanna Raider M.D.   On: 09/09/2016 01:48   Dg Foot Complete Left  Result Date: 09/09/2016 CLINICAL DATA:  Status post fall, with left foot pain. Initial encounter. EXAM: LEFT FOOT - COMPLETE 3+ VIEW COMPARISON:  Left foot radiographs performed 04/10/2016 FINDINGS: There is no evidence of fracture or dislocation. The joint spaces are preserved. There is no evidence of talar subluxation; the subtalar joint is unremarkable in appearance. No significant soft tissue abnormalities  are seen. IMPRESSION: No evidence of fracture or dislocation. Electronically Signed   By: Roanna Raider M.D.   On: 09/09/2016 01:50   Dg Hip Unilat W Or Wo Pelvis 2-3 Views Left  Result Date: 09/09/2016 CLINICAL DATA:  Status post fall, with left hip pain. Initial encounter. EXAM: DG HIP (WITH OR WITHOUT PELVIS) 2-3V LEFT COMPARISON:  None. FINDINGS: There is no evidence of fracture or dislocation. Both femoral  heads are seated normally within their respective acetabula. The proximal left femur appears intact. No significant degenerative change is appreciated. The sacroiliac joints are unremarkable in appearance. The visualized bowel gas pattern is grossly unremarkable in appearance. IMPRESSION: No evidence of fracture or dislocation. Electronically Signed   By: Roanna Raider M.D.   On: 09/09/2016 01:49    Procedures Procedures (including critical care time)  Medications Ordered in ED Medications  fentaNYL (SUBLIMAZE) injection 50 mcg (50 mcg Intramuscular Given 09/09/16 0159)     Initial Impression / Assessment and Plan / ED Course  I have reviewed the triage vital signs and the nursing notes.  Pertinent labs & imaging results that were available during my care of the patient were reviewed by me and considered in my medical decision making (see chart for details).    X-rays were obtained of all the areas she states are painful. She was given fentanyl for pain.  Recheck patient had failed to tell me she has chronic pain in that hip. She was seen at the urgent care in July for the same hip hurting. However due to the fall CT scan was done to make sure she did not have an occult hip fracture.  Patient was discharged home with her daughter.  Review of the [place] shows patient gets #30 Tylenol #3  every couple months by her PCP, last filled August 3.   Final Clinical Impressions(s) / ED Diagnoses   Final diagnoses:  Fall in home, initial encounter  Left leg pain     Plan discharge  Devoria Albe, MD, Concha Pyo, MD 09/09/16 [phone]

## 2016-09-09 NOTE — ED Notes (Signed)
Pt. And family requested to see the Doctor prior to discharge, Dr. Devoria AlbeIva Knapp made aware.

## 2016-09-09 NOTE — Discharge Instructions (Signed)
Use ice and heat for comfort. Dr Dione PloverAubuere just gave you pain medications on August 3. Contact him if you need more pain medications.

## 2016-11-05 ENCOUNTER — Emergency Department (HOSPITAL_COMMUNITY): Payer: Medicare Other

## 2016-11-05 ENCOUNTER — Encounter (HOSPITAL_COMMUNITY): Payer: Self-pay

## 2016-11-05 ENCOUNTER — Emergency Department (HOSPITAL_COMMUNITY)
Admission: EM | Admit: 2016-11-05 | Discharge: 2016-11-05 | Disposition: A | Discharge status: Home / Self Care | Payer: Medicare Other | Attending: Emergency Medicine | Admitting: Emergency Medicine

## 2016-11-05 DIAGNOSIS — Z79899 Other long term (current) drug therapy: Secondary | ICD-10-CM | POA: Insufficient documentation

## 2016-11-05 DIAGNOSIS — I10 Essential (primary) hypertension: Secondary | ICD-10-CM | POA: Diagnosis not present

## 2016-11-05 DIAGNOSIS — M5431 Sciatica, right side: Secondary | ICD-10-CM | POA: Diagnosis not present

## 2016-11-05 DIAGNOSIS — M549 Dorsalgia, unspecified: Secondary | ICD-10-CM

## 2016-11-05 DIAGNOSIS — R1013 Epigastric pain: Secondary | ICD-10-CM | POA: Insufficient documentation

## 2016-11-05 LAB — URINALYSIS, ROUTINE W REFLEX MICROSCOPIC
Bilirubin Urine: NEGATIVE
GLUCOSE, UA: NEGATIVE mg/dL
HGB URINE DIPSTICK: NEGATIVE
KETONES UR: 5 mg/dL — AB
Nitrite: NEGATIVE
PROTEIN: NEGATIVE mg/dL
SQUAMOUS EPITHELIAL / LPF: NONE SEEN
Specific Gravity, Urine: 1.004 — ABNORMAL LOW (ref 1.005–1.030)
pH: 8 (ref 5.0–8.0)

## 2016-11-05 LAB — I-STAT TROPONIN, ED
TROPONIN I, POC: 0.01 ng/mL (ref 0.00–0.08)
TROPONIN I, POC: 0 ng/mL (ref 0.00–0.08)

## 2016-11-05 LAB — CBC WITH DIFFERENTIAL/PLATELET
Basophils Absolute: 0 10*3/uL (ref 0.0–0.1)
Basophils Relative: 0 %
EOS PCT: 1 %
Eosinophils Absolute: 0.1 10*3/uL (ref 0.0–0.7)
HEMATOCRIT: 31.5 % — AB (ref 36.0–46.0)
Hemoglobin: 10.2 g/dL — ABNORMAL LOW (ref 12.0–15.0)
LYMPHS ABS: 2.2 10*3/uL (ref 0.7–4.0)
Lymphocytes Relative: 36 %
MCH: 21.7 pg — AB (ref 26.0–34.0)
MCHC: 32.4 g/dL (ref 30.0–36.0)
MCV: 67 fL — AB (ref 78.0–100.0)
MONO ABS: 0.8 10*3/uL (ref 0.1–1.0)
MONOS PCT: 13 %
NEUTROS ABS: 3 10*3/uL (ref 1.7–7.7)
Neutrophils Relative %: 50 %
PLATELETS: 250 10*3/uL (ref 150–400)
RBC: 4.7 MIL/uL (ref 3.87–5.11)
RDW: 17.3 % — AB (ref 11.5–15.5)
WBC: 6.1 10*3/uL (ref 4.0–10.5)

## 2016-11-05 LAB — COMPREHENSIVE METABOLIC PANEL
ALT: 11 U/L — ABNORMAL LOW (ref 14–54)
ANION GAP: 9 (ref 5–15)
AST: 19 U/L (ref 15–41)
Albumin: 3.4 g/dL — ABNORMAL LOW (ref 3.5–5.0)
Alkaline Phosphatase: 53 U/L (ref 38–126)
BILIRUBIN TOTAL: 0.9 mg/dL (ref 0.3–1.2)
BUN: 8 mg/dL (ref 6–20)
CHLORIDE: 96 mmol/L — AB (ref 101–111)
CO2: 32 mmol/L (ref 22–32)
Calcium: 9 mg/dL (ref 8.9–10.3)
Creatinine, Ser: 0.71 mg/dL (ref 0.44–1.00)
Glucose, Bld: 89 mg/dL (ref 65–99)
POTASSIUM: 3.1 mmol/L — AB (ref 3.5–5.1)
Sodium: 137 mmol/L (ref 135–145)
TOTAL PROTEIN: 6.8 g/dL (ref 6.5–8.1)

## 2016-11-05 LAB — LIPASE, BLOOD: LIPASE: 25 U/L (ref 11–51)

## 2016-11-05 MED ORDER — MORPHINE SULFATE (PF) 4 MG/ML IV SOLN
4.0000 mg | Freq: Once | INTRAVENOUS | Status: AC
  Administered 2016-11-05: 4 mg via INTRAVENOUS
  Filled 2016-11-05: qty 1

## 2016-11-05 MED ORDER — SODIUM CHLORIDE 0.9 % IV BOLUS (SEPSIS)
1000.0000 mL | Freq: Once | INTRAVENOUS | Status: AC
  Administered 2016-11-05: 1000 mL via INTRAVENOUS

## 2016-11-05 MED ORDER — TRAMADOL HCL 50 MG PO TABS: 50.0000 mg | ORAL_TABLET | Freq: Four times a day (QID) | ORAL | 0 refills | Status: DC | PRN

## 2016-11-05 MED ORDER — LIDOCAINE 5 % EX PTCH
1.0000 | MEDICATED_PATCH | CUTANEOUS | Status: DC
  Administered 2016-11-05: 1 via TRANSDERMAL
  Filled 2016-11-05: qty 1

## 2016-11-05 MED ORDER — CYCLOBENZAPRINE HCL 5 MG PO TABS: 5.0000 mg | ORAL_TABLET | Freq: Three times a day (TID) | ORAL | 0 refills | Status: DC | PRN

## 2016-11-05 MED ORDER — CYCLOBENZAPRINE HCL 10 MG PO TABS
5.0000 mg | ORAL_TABLET | Freq: Once | ORAL | Status: AC
  Administered 2016-11-05: 5 mg via ORAL
  Filled 2016-11-05: qty 1

## 2016-11-05 MED ORDER — LIDOCAINE 5 % EX PTCH: 1.0000 | MEDICATED_PATCH | CUTANEOUS | 0 refills | Status: DC

## 2016-11-05 MED ORDER — POTASSIUM CHLORIDE CRYS ER 20 MEQ PO TBCR
40.0000 meq | EXTENDED_RELEASE_TABLET | Freq: Once | ORAL | Status: AC
  Administered 2016-11-05: 40 meq via ORAL
  Filled 2016-11-05: qty 2

## 2016-11-05 NOTE — ED Notes (Signed)
Case management at bedside.

## 2016-11-05 NOTE — ED Notes (Addendum)
Pt d/c instructions reviewed with both pt and family prior to morphine administration. Pt and pt daughter verbalized understanding of d/c instructions, pain management, and prescriptions. This RN to give d/c instructions to [facility] to ensure they arrive at the pt's home. Pt instructed to remove lidocaine patch at approx 2:32 AM based on provider order, this information was written on pt instructions to remind pt.

## 2016-11-05 NOTE — ED Notes (Signed)
Pt daughter requesting consult to care management. Pt lives at home alone with moring aide however pt daughter does not believe this is suitable for pt. Pt does not believe she needs any extra help but daughter requesting to speak with care management. Dr. Silverio Lay aware.

## 2016-11-05 NOTE — ED Notes (Signed)
Report given to [facility] 

## 2016-11-05 NOTE — ED Provider Notes (Signed)
[facility]-EMERGENCY DEPT Provider Note   CSN: [id number] Arrival date & time: 11/05/16  1126     History   Chief Complaint Chief Complaint  Patient presents with  . Leg Pain    x 24 hrs  . Abdominal Pain    x 24 hrs    HPI Theresa Bauer is a 83 y.o. female history of hypertension, sciatica here presenting with back pain. Patient lives her herself but has in an aide who visits her daily. She has chronic rleft-sided back pain from her sciatica. Since yesterday, patient has worsening since severe sharp right-sided back pain. Patient states that she had trouble walking from the muscle spasms. She denies trouble urinating or blood in urine. Her aide came to see her and she was in so much pain that she had a hard time walking to answer the door. Also had some epigastric pain.   The history is provided by the patient.    Past Medical History:  Diagnosis Date  . GERD (gastroesophageal reflux disease)   . Glaucoma   . Hypertension     Patient Active Problem List   Diagnosis Date Noted  . Osteoporosis, unspecified 09/30/2012  . Branch retinal artery occlusion, left eye 09/30/2012  . HYPERTENSION 04/05/2010  . VENOUS INSUFFICIENCY, CHRONIC 04/05/2010  . GERD 04/05/2010  . CHEST PAIN 04/05/2010    Past Surgical History:  Procedure Laterality Date  . REPLACEMENT TOTAL KNEE BILATERAL  2007/2008    OB History    Gravida Para Term Preterm AB Living   0 0     SAB TAB Ectopic Multiple Live Births                   Home Medications    Prior to Admission medications   Medication Sig Start Date End Date Taking? Authorizing Provider  acetaminophen (TYLENOL) 325 MG tablet Take 2 tablets (650 mg total) by mouth every 6 (six) hours as needed for mild pain or moderate pain. Patient not taking: Reported on 09/09/2016 04/11/16   Everlene Farrier, PA-C  acetaminophen-codeine (TYLENOL #3) 300-30 MG tablet Take 1 tablet by mouth 2 (two) times daily as needed for moderate pain.    [provider]  alendronate (FOSAMAX) 70 MG tablet Take 70 mg by mouth every 7 (seven) days. Take with a full glass of water on an empty stomach.    [provider]  amLODipine (NORVASC) 5 MG tablet Take 5 mg by mouth daily.    [provider]  celecoxib (CELEBREX) 100 MG capsule Take 1 capsule (100 mg total) by mouth 2 (two) times daily. 08/21/16   Mardella Layman, MD  clopidogrel (PLAVIX) 75 MG tablet Take 1 tablet (75 mg total) by mouth daily. Patient not taking: Reported on 09/09/2016 10/02/12   Laveda Norman, MD  doxycycline (VIBRAMYCIN) 100 MG capsule Take 1 capsule (100 mg total) by mouth 2 (two) times daily. Patient not taking: Reported on 09/09/2016 04/06/14   Sherren Kerns, MD  hydrochlorothiazide (HYDRODIURIL) 25 MG tablet Take 25 mg by mouth daily as needed. Fluid    [provider]  Hydrocodone-Acetaminophen 2.5-325 MG TABS Take one before bed as needed for pain relief. Patient not taking: Reported on 09/09/2016 08/21/16   Mardella Layman, MD  loratadine (CLARITIN) 10 MG tablet Take 10 mg by mouth daily.    [provider]  Multiple Vitamins-Minerals (MULTIVITAMINS THER. W/MINERALS) TABS tablet Take 1 tablet by mouth daily.    [provider]  pantoprazole (PROTONIX) 40 MG tablet Take 1 tablet (40 mg total) by mouth daily. 10/02/12   Laveda Norman, MD  timolol (TIMOPTIC) 0.5 % ophthalmic solution Place 1 drop into both eyes daily. 05/01/16 05/01/17  [provider]  Travoprost, BAK Free, (TRAVATAN) 0.004 % SOLN ophthalmic solution Place 1 drop into both eyes at bedtime.    [provider]    Family History Family History  Problem Relation Age of Onset  . Heart attack Mother   . Heart attack Father   . Osteoarthritis Father     Social History Social History  Substance Use Topics  . Smoking status: Never Smoker  . Smokeless tobacco: Never Used  . Alcohol use No     Allergies   Lisinopril; Darvocet [propoxyphene  n-acetaminophen]; Aspirin; Brimonidine; and Penicillins   Review of Systems Review of Systems  Gastrointestinal: Positive for abdominal pain.  All other systems reviewed and are negative.    Physical Exam Updated Vital Signs BP (!) 153/66   Pulse 87   Temp 98.8 F (37.1 C) (Oral)   Resp 17   Ht 5' (1.524 m)   Wt 65.8 kg (145 lb)   SpO2 100%   BMI 28.32 kg/m   Physical Exam  Constitutional: She is oriented to person, place, and time. She appears well-developed.  HENT:  Head: Normocephalic.  Mouth/Throat: Oropharynx is clear and moist.  Eyes: Pupils are equal, round, and reactive to light. Conjunctivae and EOM are normal.  Neck: Normal range of motion. Neck supple.  Cardiovascular: Normal rate, regular rhythm and normal heart sounds.   Pulmonary/Chest: Effort normal and breath sounds normal. No respiratory distress. She has no wheezes.  Abdominal: Soft. Bowel sounds are normal.  + epigastric tenderness   Musculoskeletal: Normal range of motion.  Bilateral paralumbar tenderness, worse on R side, no obvious deformity   Neurological: She is alert and oriented to person, place, and time.  No saddle anesthesia. + straight leg raise on R side, nl strength throughout   Skin: Skin is warm.  Psychiatric: She has a normal mood and affect.  Nursing note and vitals reviewed.    ED Treatments / Results  Labs (all labs ordered are listed, but only abnormal results are displayed) Labs Reviewed  CBC WITH DIFFERENTIAL/PLATELET - Abnormal; Notable for the following:       Result Value   Hemoglobin 10.2 (*)    HCT 31.5 (*)    MCV 67.0 (*)    MCH 21.7 (*)    RDW 17.3 (*)    All other components within normal limits  COMPREHENSIVE METABOLIC PANEL - Abnormal; Notable for the following:    Potassium 3.1 (*)    Chloride 96 (*)    Albumin 3.4 (*)    ALT 11 (*)    All other components within normal limits  LIPASE, BLOOD  URINALYSIS, ROUTINE W REFLEX MICROSCOPIC  I-STAT TROPONIN,  ED    EKG  EKG Interpretation  Date/Time:  Wednesday November 05 2016 11:45:32 EDT Ventricular Rate:  74 PR Interval:    QRS Duration: 88 QT Interval:  396 QTC Calculation: 440 R Axis:   -18 Text Interpretation:  Sinus rhythm Probable left atrial enlargement Borderline left axis deviation No significant change since last tracing Confirmed by Richardean Canal [id number]) on 11/05/2016 11:48:15 AM Also confirmed by Richardean Canal [phone]), editor Misty Stanley [id number])  on 11/05/2016 12:22:07 PM       Radiology Dg Chest 2 View  Result  Date: 11/05/2016 CLINICAL DATA:  Epigastric pain EXAM: CHEST  2 VIEW COMPARISON:  04/10/2016 FINDINGS: Mild cardiomegaly that is stable. Stable aortic and hilar contours. Mild haziness at the bases that is from atelectasis based on contemporaneous abdominal CT. There is no edema, consolidation, effusion, or pneumothorax. No acute osseous finding. IMPRESSION: 1. Mild atelectasis at the bases. 2. Mild cardiomegaly without edema. Electronically Signed   By: Marnee Spring M.D.   On: 11/05/2016 12:36   Ct L-spine No Charge  Result Date: 11/05/2016 CLINICAL DATA:  Initial evaluation for acute right-sided back pain EXAM: CT LUMBAR SPINE WITHOUT CONTRAST TECHNIQUE: Multidetector CT imaging of the lumbar spine was performed without intravenous contrast administration. Multiplanar CT image reconstructions were also generated. COMPARISON:  Prior MRI from 12/04/2015. FINDINGS: Segmentation: Normal segmentation. Lowest well-formed disc labeled the L5-S1 level. Alignment: Mild levoscoliosis with apex at L2. 5 mm anterolisthesis of L4 on L5. Trace retrolisthesis of L1 on L2. Alignment is stable from previous MRI. Vertebrae: Vertebral body heights are maintained. No evidence for acute or interval fracture. No discrete or worrisome osseous lesions. Visualized sacrum intact. Paraspinal and other soft tissues: Paraspinous soft tissues within normal limits. Visualized visceral structures  normal. Aortic atherosclerosis. Atelectatic changes noted within the visualized lungs. Disc levels: T12-L1: Mild diffuse disc bulge. Moderate facet hypertrophy, right greater than left. No stenosis. L1-2: Trace retrolisthesis. Mild circumferential disc bulge. Bilateral facet hypertrophy. Mild canal stenosis with mild to moderate bilateral foraminal narrowing, stable from previous. L2-3: Diffuse disc bulge with disc desiccation. Mild bilateral facet hypertrophy. Moderate bilateral foraminal stenosis, left greater than right. Mild canal and subarticular stenosis, stable. L3-4: Diffuse disc bulge. Moderate to advanced bilateral facet hypertrophy. Moderate canal and bilateral subarticular stenosis is grossly similar. Mild bilateral L3 foraminal stenosis, unchanged. L4-5: 5 mm anterolisthesis. Associated broad posterior pseudo disc bulge. Severe bilateral facet arthrosis. Resultant severe canal and bilateral subarticular stenosis relatively similar. Moderate to severe bilateral L4 foraminal narrowing, right worse than left with probable L4 impingement, also similar. L5-S1: Diffuse disc bulge with disc desiccation. Disc bulging eccentric to the right. Moderate facet arthrosis bilaterally. Canal with bilateral subarticular stenosis is grossly similar. Severe bilateral L5 foraminal stenosis with probable L5 impingement, left worse than right, also similar. IMPRESSION: 1. No acute abnormality within the lumbar spine. 2. Multilevel degenerative spondylolysis as detailed above, overall relatively similar as compared to most recent MRI from 12/04/2015. 3. Severe canal and bilateral foraminal stenosis at L4-5 with L4 and L5 nerve root impingement. 4. Severe biforaminal stenosis at L5-S1 with L5 nerve root compression. 5. More mild to moderate canal and subarticular stenosis at L1-2 thru L3-4, not significantly changed. Electronically Signed   By: Rise Mu M.D.   On: 11/05/2016 13:19   Ct Renal Stone Study  Result  Date: 11/05/2016 CLINICAL DATA:  Right flank pain over the last 2 days. EXAM: CT ABDOMEN AND PELVIS WITHOUT CONTRAST TECHNIQUE: Multidetector CT imaging of the abdomen and pelvis was performed following the standard protocol without IV contrast. COMPARISON:  None. FINDINGS: Lower chest: Mild atelectasis or scarring at the lung bases left more than right. Hepatobiliary: 12 mm cyst in the left lobe at the dome of the liver. No calcified gallstones. Pancreas: Normal Spleen: Normal Adrenals/Urinary Tract: Adrenal glands are normal. Kidneys are symmetric in size. No evidence of measurable stone. No hydronephrosis or passing stone. No stone in the bladder. Slight hyperdensity of the medullary regions which could represent early mineral deposition. Stomach/Bowel: Large amount of fecal matter in the  colon, particularly the right colon. There are 2 calcifications in the right lower quadrant that I think are phleboliths. The appendix is seen separated and is normal. No significant bowel finding. Vascular/Lymphatic: Aortic atherosclerosis. No aneurysm. IVC is normal. No adenopathy. Reproductive: Benign uterine calcifications.  No adnexal mass. Other: No free fluid or air. Musculoskeletal: Considerable lower lumbar degenerative changes including anterolisthesis at L4-5. IMPRESSION: No evidence of urinary tract stone disease.  No hydronephrosis. Mild scarring or atelectasis at the lung bases left more than right. Simple hepatic cyst, unchanged. Normal appearing appendix. Fairly large amount of fecal matter in the colon, particularly the right colon. Does the patient have constipation? Electronically Signed   By: Paulina Fusi M.D.   On: 11/05/2016 12:46    Procedures Procedures (including critical care time)  Medications Ordered in ED Medications  lidocaine (LIDODERM) 5 % 1 patch (not administered)  cyclobenzaprine (FLEXERIL) tablet 5 mg (not administered)  potassium chloride SA (K-DUR,KLOR-CON) CR tablet 40 mEq (not  administered)  sodium chloride 0.9 % bolus 1,000 mL (1,000 mLs Intravenous New Bag/Given 11/05/16 1249)  morphine 4 MG/ML injection 4 mg (4 mg Intravenous Given 11/05/16 1250)     Initial Impression / Assessment and Plan / ED Course  I have reviewed the triage vital signs and the nursing notes.  Pertinent labs & imaging results that were available during my care of the patient were reviewed by me and considered in my medical decision making (see chart for details).     STEFFANY SCHOENFELDER is a 83 y.o. female here with back pain, R flank pain, epigastric pain. Consider worsening sciatica vs renal colic vs UTI, I doubt ACS. Will get labs, CT renal stone, CT lumbar spine, UA, trop x 2.   4:48 PM Pain controlled with pain meds. UA unremarkable. Trop neg x 2. Labs unremarkable. CT showed no stone. There is multi level disease but no obvious central compression. Will dc home with tramadol, flexeril, increase gabapentin to BID, try lidocaine patch. Daughter request home health aide to help take care of her.    Final Clinical Impressions(s) / ED Diagnoses   Final diagnoses:  Back pain    New Prescriptions New Prescriptions   No medications on file     Charlynne Pander, MD 11/05/16 1701

## 2016-11-05 NOTE — Discharge Instructions (Addendum)
Take motrin, tylenol for pain.   Increase your gabapentin to twice daily.   Take flexeril for muscle spasms.   Take tramadol for severe pain.   Use lidocaine patch as needed.   Home health will contact you to set up services   See your doctor.   Return to ER if you have worse back pain, chest pain, abdominal pain.

## 2016-11-05 NOTE — ED Notes (Signed)
[facility] called  

## 2016-11-05 NOTE — Care Management Note (Signed)
Case Management Note  Patient Details  Name: Theresa Bauer MRN: [id number] Date of Birth: 26-Apr-1929  Subjective/Objective:       Patient presented to [facility] ED with c/o severe back pain and difficulty ambulating.         Action/Plan: CM consulted and met with patient and her daughter to to discusss recommendations for [facility] services, on ED eval no acute changes noted as pe EDP. Patient reports having had HH in the past with [place] now [facility].  Patient and daughtr are agreeable to the recommendations and also believes patient would benefit from a bedside commode.  Orders placed and patient will receive the equipment prior to discharge from the ED. CM offered choice patient selected [facility] referral sent to [facility] via email .   Expected Discharge Date: 11/05/2016                 Expected Discharge Plan:  [place]  In-House Referral:     Discharge planning Services  CM Consult  Post Acute Care Choice:    Choice offered to:  Patient, Adult Children Theresa Bauer (Daughter))  DME Arranged:  3-N-1 DME Agency:  [place]:  RN, PT, OT, Nurse's Aide [place] Agency:  [facility] (now [facility])  Status of Service:  Completed, signed off  If discussed at [place] of Stay Meetings, dates discussed:    Additional CommentsLaurena Slimmer, RN 11/05/2016, 11:07 PM

## 2016-11-05 NOTE — ED Triage Notes (Signed)
Pt c/o of pain in right leg and adm x 24 hrs. Pt states it is nerve pain and has never been this bad before.

## 2016-11-05 NOTE — ED Notes (Signed)
ED Provider at bedside. 

## 2016-11-07 ENCOUNTER — Telehealth: Payer: Self-pay | Admitting: Surgery

## 2016-11-07 NOTE — Telephone Encounter (Signed)
ED CM attempted to return call from patient's daughter Colinda Barth no answer. CM will attempt again tomorrow.

## 2016-11-08 ENCOUNTER — Encounter (HOSPITAL_COMMUNITY): Payer: Self-pay

## 2016-11-08 ENCOUNTER — Observation Stay (HOSPITAL_COMMUNITY): Payer: Medicare Other

## 2016-11-08 ENCOUNTER — Inpatient Hospital Stay (HOSPITAL_COMMUNITY)
Admission: EM | Admit: 2016-11-08 | Discharge: 2016-11-12 | DRG: 552 | Disposition: A | Discharge status: Skilled Nursing Facility | Payer: Medicare Other | Attending: Family Medicine | Admitting: Family Medicine

## 2016-11-08 DIAGNOSIS — G8929 Other chronic pain: Secondary | ICD-10-CM | POA: Diagnosis present

## 2016-11-08 DIAGNOSIS — R52 Pain, unspecified: Secondary | ICD-10-CM | POA: Diagnosis not present

## 2016-11-08 DIAGNOSIS — K219 Gastro-esophageal reflux disease without esophagitis: Secondary | ICD-10-CM | POA: Diagnosis present

## 2016-11-08 DIAGNOSIS — Z96653 Presence of artificial knee joint, bilateral: Secondary | ICD-10-CM | POA: Diagnosis present

## 2016-11-08 DIAGNOSIS — K5900 Constipation, unspecified: Secondary | ICD-10-CM | POA: Diagnosis present

## 2016-11-08 DIAGNOSIS — K5909 Other constipation: Secondary | ICD-10-CM

## 2016-11-08 DIAGNOSIS — M5117 Intervertebral disc disorders with radiculopathy, lumbosacral region: Secondary | ICD-10-CM | POA: Diagnosis not present

## 2016-11-08 DIAGNOSIS — R627 Adult failure to thrive: Secondary | ICD-10-CM

## 2016-11-08 DIAGNOSIS — Z9181 History of falling: Secondary | ICD-10-CM

## 2016-11-08 DIAGNOSIS — E871 Hypo-osmolality and hyponatremia: Secondary | ICD-10-CM | POA: Diagnosis not present

## 2016-11-08 DIAGNOSIS — Z79899 Other long term (current) drug therapy: Secondary | ICD-10-CM

## 2016-11-08 DIAGNOSIS — E860 Dehydration: Secondary | ICD-10-CM | POA: Diagnosis present

## 2016-11-08 DIAGNOSIS — M5416 Radiculopathy, lumbar region: Secondary | ICD-10-CM

## 2016-11-08 DIAGNOSIS — E876 Hypokalemia: Secondary | ICD-10-CM

## 2016-11-08 DIAGNOSIS — Z886 Allergy status to analgesic agent status: Secondary | ICD-10-CM

## 2016-11-08 DIAGNOSIS — I10 Essential (primary) hypertension: Secondary | ICD-10-CM | POA: Diagnosis present

## 2016-11-08 DIAGNOSIS — M545 Low back pain, unspecified: Secondary | ICD-10-CM

## 2016-11-08 DIAGNOSIS — Z880 Allergy status to penicillin: Secondary | ICD-10-CM

## 2016-11-08 DIAGNOSIS — Z888 Allergy status to other drugs, medicaments and biological substances status: Secondary | ICD-10-CM

## 2016-11-08 DIAGNOSIS — R339 Retention of urine, unspecified: Secondary | ICD-10-CM | POA: Diagnosis present

## 2016-11-08 DIAGNOSIS — Z9842 Cataract extraction status, left eye: Secondary | ICD-10-CM

## 2016-11-08 DIAGNOSIS — Z7902 Long term (current) use of antithrombotics/antiplatelets: Secondary | ICD-10-CM

## 2016-11-08 LAB — COMPREHENSIVE METABOLIC PANEL
ALK PHOS: 54 U/L (ref 38–126)
ALT: 12 U/L — ABNORMAL LOW (ref 14–54)
ANION GAP: 14 (ref 5–15)
AST: 23 U/L (ref 15–41)
Albumin: 3.2 g/dL — ABNORMAL LOW (ref 3.5–5.0)
BUN: 7 mg/dL (ref 6–20)
CALCIUM: 8.7 mg/dL — AB (ref 8.9–10.3)
CO2: 27 mmol/L (ref 22–32)
Chloride: 91 mmol/L — ABNORMAL LOW (ref 101–111)
Creatinine, Ser: 0.59 mg/dL (ref 0.44–1.00)
GFR calc non Af Amer: >60 mL/min (ref >60)
Glucose, Bld: 99 mg/dL (ref 65–99)
Potassium: 3 mmol/L — ABNORMAL LOW (ref 3.5–5.1)
SODIUM: 132 mmol/L — AB (ref 135–145)
TOTAL PROTEIN: 7 g/dL (ref 6.5–8.1)
Total Bilirubin: 1.1 mg/dL (ref 0.3–1.2)

## 2016-11-08 LAB — URINALYSIS, ROUTINE W REFLEX MICROSCOPIC
Bacteria, UA: NONE SEEN
Bilirubin Urine: NEGATIVE
GLUCOSE, UA: NEGATIVE mg/dL
Ketones, ur: 20 mg/dL — AB
Leukocytes, UA: NEGATIVE
NITRITE: NEGATIVE
Protein, ur: NEGATIVE mg/dL
SPECIFIC GRAVITY, URINE: 1.012 (ref 1.005–1.030)
Squamous Epithelial / LPF: NONE SEEN
pH: 5 (ref 5.0–8.0)

## 2016-11-08 LAB — CBC
HCT: 29.1 % — ABNORMAL LOW (ref 36.0–46.0)
HEMOGLOBIN: 9.9 g/dL — AB (ref 12.0–15.0)
MCH: 22.5 pg — AB (ref 26.0–34.0)
MCHC: 34 g/dL (ref 30.0–36.0)
MCV: 66.1 fL — ABNORMAL LOW (ref 78.0–100.0)
Platelets: 265 10*3/uL (ref 150–400)
RBC: 4.4 MIL/uL (ref 3.87–5.11)
RDW: 17 % — AB (ref 11.5–15.5)
WBC: 7.3 10*3/uL (ref 4.0–10.5)

## 2016-11-08 LAB — LIPASE, BLOOD: Lipase: 19 U/L (ref 11–51)

## 2016-11-08 MED ORDER — HYDROMORPHONE HCL 1 MG/ML IJ SOLN
0.5000 mg | INTRAMUSCULAR | Status: AC | PRN
  Administered 2016-11-08 (×2): 0.5 mg via INTRAVENOUS
  Filled 2016-11-08 (×2): qty 1

## 2016-11-08 MED ORDER — TIMOLOL MALEATE 0.5 % OP SOLN
1.0000 [drp] | Freq: Every day | OPHTHALMIC | Status: DC
  Administered 2016-11-09 – 2016-11-12 (×4): 1 [drp] via OPHTHALMIC
  Filled 2016-11-08: qty 5

## 2016-11-08 MED ORDER — BISACODYL 10 MG RE SUPP
10.0000 mg | Freq: Every day | RECTAL | Status: AC
  Administered 2016-11-09 – 2016-11-10 (×2): 10 mg via RECTAL
  Filled 2016-11-08 (×3): qty 1

## 2016-11-08 MED ORDER — SODIUM CHLORIDE 0.9 % IV SOLN
INTRAVENOUS | Status: DC
  Administered 2016-11-08: 15:00:00 via INTRAVENOUS

## 2016-11-08 MED ORDER — AMLODIPINE BESYLATE 5 MG PO TABS
5.0000 mg | ORAL_TABLET | Freq: Every day | ORAL | Status: DC
  Administered 2016-11-09 – 2016-11-12 (×4): 5 mg via ORAL
  Filled 2016-11-08 (×6): qty 1

## 2016-11-08 MED ORDER — LIDOCAINE 5 % EX PTCH
1.0000 | MEDICATED_PATCH | CUTANEOUS | Status: DC
  Administered 2016-11-08 – 2016-11-11 (×4): 1 via TRANSDERMAL
  Filled 2016-11-08 (×4): qty 1

## 2016-11-08 MED ORDER — METHYLPREDNISOLONE SODIUM SUCC 125 MG IJ SOLR
60.0000 mg | INTRAMUSCULAR | Status: DC
  Administered 2016-11-08 – 2016-11-11 (×4): 60 mg via INTRAVENOUS
  Filled 2016-11-08 (×5): qty 2

## 2016-11-08 MED ORDER — CYCLOBENZAPRINE HCL 5 MG PO TABS
5.0000 mg | ORAL_TABLET | Freq: Three times a day (TID) | ORAL | Status: DC | PRN
  Administered 2016-11-10 – 2016-11-12 (×3): 5 mg via ORAL
  Filled 2016-11-08 (×3): qty 1

## 2016-11-08 MED ORDER — LATANOPROST 0.005 % OP SOLN
1.0000 [drp] | Freq: Every day | OPHTHALMIC | Status: DC
  Administered 2016-11-10 – 2016-11-11 (×2): 1 [drp] via OPHTHALMIC
  Filled 2016-11-08: qty 2.5

## 2016-11-08 MED ORDER — SODIUM CHLORIDE 0.9 % IV SOLN
INTRAVENOUS | Status: AC
  Administered 2016-11-08 – 2016-11-09 (×2): via INTRAVENOUS

## 2016-11-08 MED ORDER — HYDROMORPHONE HCL-NACL 0.5-0.9 MG/ML-% IV SOSY
0.5000 mg | PREFILLED_SYRINGE | INTRAVENOUS | Status: DC | PRN
Start: 2016-11-08 — End: 2016-11-10
  Administered 2016-11-09: 0.5 mg via INTRAVENOUS
  Filled 2016-11-08: qty 1

## 2016-11-08 MED ORDER — POTASSIUM CHLORIDE CRYS ER 20 MEQ PO TBCR
40.0000 meq | EXTENDED_RELEASE_TABLET | Freq: Once | ORAL | Status: AC
  Administered 2016-11-08: 40 meq via ORAL
  Filled 2016-11-08: qty 2

## 2016-11-08 MED ORDER — PANTOPRAZOLE SODIUM 40 MG PO TBEC
40.0000 mg | DELAYED_RELEASE_TABLET | Freq: Every day | ORAL | Status: DC
  Administered 2016-11-08 – 2016-11-12 (×5): 40 mg via ORAL
  Filled 2016-11-08 (×5): qty 1

## 2016-11-08 MED ORDER — ENOXAPARIN SODIUM 40 MG/0.4ML ~~LOC~~ SOLN: 40.0000 mg | SUBCUTANEOUS | Status: DC

## 2016-11-08 MED ORDER — OXYCODONE-ACETAMINOPHEN 5-325 MG PO TABS
ORAL_TABLET | ORAL | Status: AC
  Administered 2016-11-08: 1 via ORAL
  Filled 2016-11-08: qty 1

## 2016-11-08 MED ORDER — GABAPENTIN 300 MG PO CAPS
300.0000 mg | ORAL_CAPSULE | Freq: Every day | ORAL | Status: DC
  Administered 2016-11-08 – 2016-11-11 (×4): 300 mg via ORAL
  Filled 2016-11-08 (×4): qty 1

## 2016-11-08 MED ORDER — OXYCODONE-ACETAMINOPHEN 5-325 MG PO TABS
1.0000 | ORAL_TABLET | Freq: Three times a day (TID) | ORAL | Status: DC
  Administered 2016-11-08: 1 via ORAL

## 2016-11-08 MED ORDER — SENNOSIDES-DOCUSATE SODIUM 8.6-50 MG PO TABS
1.0000 | ORAL_TABLET | Freq: Every day | ORAL | Status: DC
  Administered 2016-11-08 – 2016-11-11 (×2): 1 via ORAL
  Filled 2016-11-08 (×4): qty 1

## 2016-11-08 MED ORDER — MINERAL OIL RE ENEM
1.0000 | ENEMA | Freq: Once | RECTAL | Status: AC
  Administered 2016-11-09: 11:00:00 1 via RECTAL
  Filled 2016-11-08: qty 1

## 2016-11-08 NOTE — ED Notes (Signed)
Bed: VH84 Expected date:  Expected time:  Means of arrival:  Comments: 83 yo back/abd pain

## 2016-11-08 NOTE — H&P (Signed)
History and Physical  Theresa Bauer:[id number] DOB: 1929-08-06 DOA: 11/08/2016  Referring physician: EDP PCP: Fleet Contras, MD   Chief Complaint: low back pain, not able to ambulate, poor oral intake  HPI: Theresa Bauer is a 83 y.o. female   With h/o HTN, Branch retinal artery occlusion, left eye ( was on plavix, but she reports she was only on it briefly), h/o osteoporosis, h/o chronic back pain ( report received epidural injection in 08/2016 which did not help).  She has been having increasing back pain for the last week, she was seen in the ED on 10/3, ct renal stone/ct lumber spine obtained, she is prescribed prn pain meds and muscle relaxer and discharged home. However, her back pain become worse, pain radiation down to both legs, she has not been able to ambulate for the last few days. She presented to the ED Again today, she received iv dilaudidx2 in the ED with minimal improvement of the pain, basic labs showed hyponatremia, hypokalemia. EDP discussed case with neurosurgery who recommend admit to the hospital and IR consult for possible intervention.   [facility] called to [facility] the patient. She denies fever, no chest pain, no hypoxia. Her vital is stable. She reports no bm for a week, she denies urinary difficulties.   Review of Systems:  Detail per HPI, Review of systems are otherwise negative  Past Medical History:  Diagnosis Date  . GERD (gastroesophageal reflux disease)   . Glaucoma   . Hypertension    Past Surgical History:  Procedure Laterality Date  . CATARACT EXTRACTION Left   . GLAUCOMA REPAIR Left   . REPLACEMENT TOTAL KNEE BILATERAL  2007/2008   Social History:  reports that she has never smoked. She has never used smokeless tobacco. She reports that she does not drink alcohol or use drugs. Patient lives at home by herself & is able to participate in activities of daily living independently until a week ago.   Allergies  Allergen Reactions  . Lisinopril  Anaphylaxis  . Darvocet [Propoxyphene N-Acetaminophen] Itching  . Aspirin     itching  . Brimonidine Itching  . Penicillins     Itching Has patient had a PCN reaction causing immediate rash, facial/tongue/throat swelling, SOB or lightheadedness with hypotension: no Has patient had a PCN reaction causing severe rash involving mucus membranes or skin necrosis: no Has patient had a PCN reaction that required hospitalization: no Has patient had a PCN reaction occurring within the last 10 years: no If all of the above answers are "NO", then may proceed with Cephalosporin use.   . Darvon [Propoxyphene] Itching    Family History  Problem Relation Age of Onset  . Heart attack Mother   . Heart attack Father   . Osteoarthritis Father       Prior to Admission medications   Medication Sig Start Date End Date Taking? Authorizing Provider  acetaminophen (TYLENOL) 325 MG tablet Take 2 tablets (650 mg total) by mouth every 6 (six) hours as needed for mild pain or moderate pain. Patient not taking: Reported on 09/09/2016 04/11/16   Everlene Farrier, PA-C  acetaminophen-codeine (TYLENOL #3) 300-30 MG tablet Take 1 tablet by mouth 2 (two) times daily as needed for moderate pain.    [provider]  alendronate (FOSAMAX) 70 MG tablet Take 70 mg by mouth every 7 (seven) days. Take with a full glass of water on an empty stomach.    [provider]  amLODipine (NORVASC) 5 MG tablet Take  5 mg by mouth daily.    [provider]  celecoxib (CELEBREX) 100 MG capsule Take 1 capsule (100 mg total) by mouth 2 (two) times daily. 08/21/16   Mardella Layman, MD  clopidogrel (PLAVIX) 75 MG tablet Take 1 tablet (75 mg total) by mouth daily. Patient not taking: Reported on 09/09/2016 10/02/12   Laveda Norman, MD  cyclobenzaprine (FLEXERIL) 5 MG tablet Take 1 tablet (5 mg total) by mouth 3 (three) times daily as needed. 11/05/16   Charlynne Pander, MD  doxycycline (VIBRAMYCIN) 100 MG capsule Take 1  capsule (100 mg total) by mouth 2 (two) times daily. Patient not taking: Reported on 09/09/2016 04/06/14   Sherren Kerns, MD  hydrochlorothiazide (HYDRODIURIL) 25 MG tablet Take 25 mg by mouth daily as needed. Fluid    [provider]  Hydrocodone-Acetaminophen 2.5-325 MG TABS Take one before bed as needed for pain relief. Patient not taking: Reported on 09/09/2016 08/21/16   Mardella Layman, MD  lidocaine (LIDODERM) 5 % Place 1 patch onto the skin daily. Remove & Discard patch within 12 hours or as directed by MD 11/05/16   Charlynne Pander, MD  loratadine (CLARITIN) 10 MG tablet Take 10 mg by mouth daily.    [provider]  Multiple Vitamins-Minerals (MULTIVITAMINS THER. W/MINERALS) TABS tablet Take 1 tablet by mouth daily.    [provider]  pantoprazole (PROTONIX) 40 MG tablet Take 1 tablet (40 mg total) by mouth daily. 10/02/12   Laveda Norman, MD  timolol (TIMOPTIC) 0.5 % ophthalmic solution Place 1 drop into both eyes daily. 05/01/16 05/01/17  [provider]  traMADol (ULTRAM) 50 MG tablet Take 1 tablet (50 mg total) by mouth every 6 (six) hours as needed. 11/05/16   Charlynne Pander, MD  Travoprost, BAK Free, (TRAVATAN) 0.004 % SOLN ophthalmic solution Place 1 drop into both eyes at bedtime.    [provider]    Physical Exam: BP (!) 154/66 (BP Location: Right Arm)   Pulse 97   Resp 17   Ht 5' (1.524 m)   Wt 65.8 kg (145 lb)   SpO2 98%   BMI 28.32 kg/m   General:  Frail elderly female laying in bed c/o back pain, she is drowsy from iv dilaudid, but she is oriented x3. Eyes: PERRL ENT: unremarkable Neck: supple, no JVD Cardiovascular: RRR Respiratory: CTABL Abdomen: diffuse mild tender, no guarding, no rebound, positive bowel sounds Skin: no rash Musculoskeletal:  No edema Psychiatric: calm/cooperative Neurologic: no focal findings  , she is not able to lift both legs against gravity.           Labs on Admission:  Basic Metabolic  Panel:  Recent Labs Lab 11/05/16 1156 11/08/16 1513  NA 137 132*  K 3.1* 3.0*  CL 96* 91*  CO2 32 27  GLUCOSE 89 99  BUN 8 7  CREATININE 0.71 0.59  CALCIUM 9.0 8.7*   Liver Function Tests:  Recent Labs Lab 11/05/16 1156 11/08/16 1513  AST 19 23  ALT 11* 12*  ALKPHOS 53 54  BILITOT 0.9 1.1  PROT 6.8 7.0  ALBUMIN 3.4* 3.2*    Recent Labs Lab 11/05/16 1156 11/08/16 1513  LIPASE 25 19   No results for input(s): AMMONIA in the last 168 hours. CBC:  Recent Labs Lab 11/05/16 1156 11/08/16 1512  WBC 6.1 7.3  NEUTROABS 3.0  --   HGB 10.2* 9.9*  HCT 31.5* 29.1*  MCV 67.0* 66.1*  PLT 250  265   Cardiac Enzymes: No results for input(s): CKTOTAL, CKMB, CKMBINDEX, TROPONINI in the last 168 hours.  BNP (last 3 results) No results for input(s): BNP in the last 8760 hours.  ProBNP (last 3 results) No results for input(s): PROBNP in the last 8760 hours.  CBG: No results for input(s): GLUCAP in the last 168 hours.  Radiological Exams on Admission: No results found.    Assessment/Plan Present on Admission: . Low back pain  Low back pain/radiation down to legs, not able to ambulate with constipation -this her second visit to the ED due to the pain, she was able to ambulate a week ago, now she is not able to walk, not able to lift both legs against gravity -EDP discussed CT L spine obtained on 10/3  with neurosurgery oncall Dr Ditty/PA who recommended IR consult for possible intervention -Will get MRI spine due to not able to walk with constipation, she denies urinary problems , denies saddle anesthesia.  -She received iv dilaudid x2 in the ED without significant improvement of pain ,it just made her drowsy. -Will schedule percocet q8hrs for three doses for now, continue prn iv dilaudid, add neurontin, start trial dose of steroids -Hold pharmacologic dvt prophylasix for now, she is kept npo after midnight, in case IR plan for procedure.   Dehydration, poor oral  intake, hyponatremia, hypokalemia: Hold home meds HCTZ, ivf 75cc /hr for 24hrs, replace k, check mag Repeat bmp in am to decide further hydration and electrolytes replacement  Sever constipation:  She reports diffuse ab pain, no bm for a week, CT ab on 10/3 with constipation Enema x1, start stool softener  FTT: previously lives by self. She has h/o falls, she was told to use a walker since July this year, but she uses a cane most of the time. She has not been able to walk for a week. Will get PT eval, she is open to rehab placement if indicated.   HTN; continue norvasc, hold HCTZ, add prn hydralazine for sbp> 180    DVT prophylaxis: SCD for now, start lovenox if no intervention   Consultants:  EDP talked to neurosurgery Dr Ditty/PA who recommended IR consult IR consulted  Code Status: full   Family Communication:  Patient and daughter at bedside  Disposition Plan: med surg , observation  Time spent:  Trysta Showman MD, PhD [facility] Pager [phone] If 7PM-7AM, please contact night-coverage at [email or web address], password [facility]

## 2016-11-08 NOTE — ED Triage Notes (Signed)
She reports low back pain for about a week. She was recently seen at a [facility] for this week and was prescribed Tramadol and cyclobenzaprine. She called EMS r/t persistent low back pain radiating into bilat. Flank areas. She rec'd. 50 mcg of Fentanyl en route to hospital. She arrives here in no distress.

## 2016-11-08 NOTE — ED Notes (Signed)
Patient transported to MRI. She remains in no distress, and her family are at bedside.

## 2016-11-08 NOTE — ED Provider Notes (Signed)
[facility]-EMERGENCY DEPT Provider Note   CSN: [id number] Arrival date & time: 11/08/16  1320     History   Chief Complaint Chief Complaint  Patient presents with  . Back Pain    HPI Theresa Bauer is a 83 y.o. female.  HPI Patient presents to the emergency room for evaluation of worsening bilateral back pain as well as abdominal pain. Patient states she's had issues with chronic back pain for over a year. She has had previous MRIs as well as spinal injections.  Pain has not really gotten much better. In the last week or so the pain has become more severe. Patient is having trouble walking. She denies any urinary issues. She is having some trouble with constipation. Symptoms worsened acutely and she had difficulty walking. She was seen in the emergency room on October 3.   Past Medical History:  Diagnosis Date  . GERD (gastroesophageal reflux disease)   . Glaucoma   . Hypertension     Patient Active Problem List   Diagnosis Date Noted  . Osteoporosis, unspecified 09/30/2012  . Branch retinal artery occlusion, left eye 09/30/2012  . HYPERTENSION 04/05/2010  . VENOUS INSUFFICIENCY, CHRONIC 04/05/2010  . GERD 04/05/2010  . CHEST PAIN 04/05/2010    Past Surgical History:  Procedure Laterality Date  . CATARACT EXTRACTION Left   . GLAUCOMA REPAIR Left   . REPLACEMENT TOTAL KNEE BILATERAL  2007/2008    OB History    Gravida Para Term Preterm AB Living   0 0     SAB TAB Ectopic Multiple Live Births                   Home Medications    Prior to Admission medications   Medication Sig Start Date End Date Taking? Authorizing Provider  acetaminophen (TYLENOL) 325 MG tablet Take 2 tablets (650 mg total) by mouth every 6 (six) hours as needed for mild pain or moderate pain. Patient not taking: Reported on 09/09/2016 04/11/16   Everlene Farrier, PA-C  acetaminophen-codeine (TYLENOL #3) 300-30 MG tablet Take 1 tablet by mouth 2 (two) times daily as needed for moderate pain.     [provider]  alendronate (FOSAMAX) 70 MG tablet Take 70 mg by mouth every 7 (seven) days. Take with a full glass of water on an empty stomach.    [provider]  amLODipine (NORVASC) 5 MG tablet Take 5 mg by mouth daily.    [provider]  celecoxib (CELEBREX) 100 MG capsule Take 1 capsule (100 mg total) by mouth 2 (two) times daily. 08/21/16   Mardella Layman, MD  clopidogrel (PLAVIX) 75 MG tablet Take 1 tablet (75 mg total) by mouth daily. Patient not taking: Reported on 09/09/2016 10/02/12   Laveda Norman, MD  cyclobenzaprine (FLEXERIL) 5 MG tablet Take 1 tablet (5 mg total) by mouth 3 (three) times daily as needed. 11/05/16   Charlynne Pander, MD  doxycycline (VIBRAMYCIN) 100 MG capsule Take 1 capsule (100 mg total) by mouth 2 (two) times daily. Patient not taking: Reported on 09/09/2016 04/06/14   Sherren Kerns, MD  hydrochlorothiazide (HYDRODIURIL) 25 MG tablet Take 25 mg by mouth daily as needed. Fluid    [provider]  Hydrocodone-Acetaminophen 2.5-325 MG TABS Take one before bed as needed for pain relief. Patient not taking: Reported on 09/09/2016 08/21/16   Mardella Layman, MD  lidocaine (LIDODERM) 5 % Place 1 patch onto the skin daily. Remove & Discard patch  within 12 hours or as directed by MD 11/05/16   Charlynne Pander, MD  loratadine (CLARITIN) 10 MG tablet Take 10 mg by mouth daily.    [provider]  Multiple Vitamins-Minerals (MULTIVITAMINS THER. W/MINERALS) TABS tablet Take 1 tablet by mouth daily.    [provider]  pantoprazole (PROTONIX) 40 MG tablet Take 1 tablet (40 mg total) by mouth daily. 10/02/12   Laveda Norman, MD  timolol (TIMOPTIC) 0.5 % ophthalmic solution Place 1 drop into both eyes daily. 05/01/16 05/01/17  [provider]  traMADol (ULTRAM) 50 MG tablet Take 1 tablet (50 mg total) by mouth every 6 (six) hours as needed. 11/05/16   Charlynne Pander, MD  Travoprost, BAK Free, (TRAVATAN) 0.004 % SOLN  ophthalmic solution Place 1 drop into both eyes at bedtime.    [provider]    Family History Family History  Problem Relation Age of Onset  . Heart attack Mother   . Heart attack Father   . Osteoarthritis Father     Social History Social History  Substance Use Topics  . Smoking status: Never Smoker  . Smokeless tobacco: Never Used  . Alcohol use No     Allergies   Lisinopril; Darvocet [propoxyphene n-acetaminophen]; Aspirin; Brimonidine; Penicillins; and Darvon [propoxyphene]   Review of Systems Review of Systems  All other systems reviewed and are negative.    Physical Exam Updated Vital Signs BP (!) 154/66 (BP Location: Right Arm)   Pulse 97   Resp 17   Ht 1.524 m (5')   Wt 65.8 kg (145 lb)   SpO2 98%   BMI 28.32 kg/m   Physical Exam  Constitutional: No distress.  Elderly, frail  HENT:  Head: Normocephalic and atraumatic.  Right Ear: External ear normal.  Left Ear: External ear normal.  Eyes: Conjunctivae are normal. Right eye exhibits no discharge. Left eye exhibits no discharge. No scleral icterus.  Neck: Neck supple. No tracheal deviation present.  Cardiovascular: Normal rate, regular rhythm and intact distal pulses.   Pulmonary/Chest: Effort normal and breath sounds normal. No stridor. No respiratory distress. She has no wheezes. She has no rales.  Abdominal: Soft. Bowel sounds are normal. She exhibits no distension. There is generalized tenderness. There is no rebound and no guarding.  Musculoskeletal: She exhibits no edema or tenderness.  Neurological: She is alert. She has normal strength. No cranial nerve deficit (no facial droop, extraocular movements intact, no slurred speech) or sensory deficit. She exhibits normal muscle tone. She displays no seizure activity. Coordination normal.  Skin: Skin is warm and dry. No rash noted.  Psychiatric: She has a normal mood and affect.  Nursing note and vitals reviewed.    ED Treatments /  Results  Labs (all labs ordered are listed, but only abnormal results are displayed) Labs Reviewed  CBC - Abnormal; Notable for the following:       Result Value   Hemoglobin 9.9 (*)    HCT 29.1 (*)    MCV 66.1 (*)    MCH 22.5 (*)    RDW 17.0 (*)    All other components within normal limits  COMPREHENSIVE METABOLIC PANEL - Abnormal; Notable for the following:    Sodium 132 (*)    Potassium 3.0 (*)    Chloride 91 (*)    Calcium 8.7 (*)    Albumin 3.2 (*)    ALT 12 (*)    All other components within normal limits  LIPASE, BLOOD  URINALYSIS,  ROUTINE W REFLEX MICROSCOPIC     Radiology No results found.  Procedures Procedures (including critical care time)  Medications Ordered in ED Medications  0.9 %  sodium chloride infusion ( Intravenous New Bag/Given 11/08/16 1522)  potassium chloride SA (K-DUR,KLOR-CON) CR tablet 40 mEq (not administered)  HYDROmorphone (DILAUDID) injection 0.5 mg (0.5 mg Intravenous Given 11/08/16 1641)     Initial Impression / Assessment and Plan / ED Course  I have reviewed the triage vital signs and the nursing notes.  Pertinent labs & imaging results that were available during my care of the patient were reviewed by me and considered in my medical decision making (see chart for details).  Clinical Course as of Nov 08 1640  Sat Nov 08, 2016  1513 Reviewed CT scan findings.  No acute abnormality of abdomen and pelvis.  Constipation noted on the CT scan.  Lumbar recon shows spinal stenosis and nerve impingment  [JK]  1639 Patient has had some relief with the Dilaudid however she still is unable to get up and stand  [JK]    Clinical Course User Index [JK] Linwood Dibbles, MD   Patient presented to the emergency room with recurrent persistent low back pain. Patient was just seen 3 days ago for similar symptoms. She has tried prescribed medications but her pain has gotten worse. Patient is having difficulty even standing or doing any daily activities.  Normally she lives at home alone.  Patient's CT scan previous day showed significant degenerative disc disease and spinal stenosis with impingement at both L4 L5 and L5-S1.  This is causing nerve root compression and significant pain.  Patient still has normal strength and sensation on exam. Considering her age and do not think spinal surgery is indicated.  I will consult with neurosurgery and the medical service. Patient may need admission for pain management and eventually short-term rehabilitation. Final Clinical Impressions(s) / ED Diagnoses   Final diagnoses:  Lumbar radiculopathy, acute  Intractable pain  Hypokalemia      Linwood Dibbles, MD 11/08/16 1642

## 2016-11-09 DIAGNOSIS — Z880 Allergy status to penicillin: Secondary | ICD-10-CM | POA: Diagnosis not present

## 2016-11-09 DIAGNOSIS — G8929 Other chronic pain: Secondary | ICD-10-CM | POA: Diagnosis present

## 2016-11-09 DIAGNOSIS — Z9842 Cataract extraction status, left eye: Secondary | ICD-10-CM | POA: Diagnosis not present

## 2016-11-09 DIAGNOSIS — Z96653 Presence of artificial knee joint, bilateral: Secondary | ICD-10-CM | POA: Diagnosis present

## 2016-11-09 DIAGNOSIS — E871 Hypo-osmolality and hyponatremia: Secondary | ICD-10-CM | POA: Diagnosis present

## 2016-11-09 DIAGNOSIS — R339 Retention of urine, unspecified: Secondary | ICD-10-CM | POA: Diagnosis present

## 2016-11-09 DIAGNOSIS — Z886 Allergy status to analgesic agent status: Secondary | ICD-10-CM | POA: Diagnosis not present

## 2016-11-09 DIAGNOSIS — R52 Pain, unspecified: Secondary | ICD-10-CM | POA: Diagnosis not present

## 2016-11-09 DIAGNOSIS — I10 Essential (primary) hypertension: Secondary | ICD-10-CM | POA: Diagnosis not present

## 2016-11-09 DIAGNOSIS — R338 Other retention of urine: Secondary | ICD-10-CM

## 2016-11-09 DIAGNOSIS — M5117 Intervertebral disc disorders with radiculopathy, lumbosacral region: Secondary | ICD-10-CM | POA: Diagnosis present

## 2016-11-09 DIAGNOSIS — Z7902 Long term (current) use of antithrombotics/antiplatelets: Secondary | ICD-10-CM | POA: Diagnosis not present

## 2016-11-09 DIAGNOSIS — M5416 Radiculopathy, lumbar region: Secondary | ICD-10-CM | POA: Diagnosis not present

## 2016-11-09 DIAGNOSIS — E860 Dehydration: Secondary | ICD-10-CM | POA: Diagnosis present

## 2016-11-09 DIAGNOSIS — K5909 Other constipation: Secondary | ICD-10-CM | POA: Diagnosis not present

## 2016-11-09 DIAGNOSIS — M545 Low back pain: Secondary | ICD-10-CM | POA: Diagnosis present

## 2016-11-09 DIAGNOSIS — K219 Gastro-esophageal reflux disease without esophagitis: Secondary | ICD-10-CM | POA: Diagnosis present

## 2016-11-09 DIAGNOSIS — Z888 Allergy status to other drugs, medicaments and biological substances status: Secondary | ICD-10-CM | POA: Diagnosis not present

## 2016-11-09 DIAGNOSIS — Z9181 History of falling: Secondary | ICD-10-CM | POA: Diagnosis not present

## 2016-11-09 DIAGNOSIS — E876 Hypokalemia: Secondary | ICD-10-CM | POA: Diagnosis not present

## 2016-11-09 DIAGNOSIS — R627 Adult failure to thrive: Secondary | ICD-10-CM | POA: Diagnosis not present

## 2016-11-09 DIAGNOSIS — K5900 Constipation, unspecified: Secondary | ICD-10-CM | POA: Diagnosis present

## 2016-11-09 DIAGNOSIS — Z79899 Other long term (current) drug therapy: Secondary | ICD-10-CM | POA: Diagnosis not present

## 2016-11-09 LAB — BASIC METABOLIC PANEL
Anion gap: 9 (ref 5–15)
BUN: 7 mg/dL (ref 6–20)
CHLORIDE: 101 mmol/L (ref 101–111)
CO2: 27 mmol/L (ref 22–32)
CREATININE: 0.49 mg/dL (ref 0.44–1.00)
Calcium: 8.6 mg/dL — ABNORMAL LOW (ref 8.9–10.3)
GFR calc Af Amer: >60 mL/min (ref >60)
GFR calc non Af Amer: >60 mL/min (ref >60)
Glucose, Bld: 130 mg/dL — ABNORMAL HIGH (ref 65–99)
Potassium: 4.1 mmol/L (ref 3.5–5.1)
SODIUM: 137 mmol/L (ref 135–145)

## 2016-11-09 LAB — MAGNESIUM: MAGNESIUM: 1.9 mg/dL (ref 1.7–2.4)

## 2016-11-09 LAB — PROTIME-INR
INR: 1.12
Prothrombin Time: 14.3 seconds (ref 11.4–15.2)

## 2016-11-09 MED ORDER — TAMSULOSIN HCL 0.4 MG PO CAPS
0.4000 mg | ORAL_CAPSULE | Freq: Every day | ORAL | Status: DC
  Administered 2016-11-10 – 2016-11-12 (×3): 0.4 mg via ORAL
  Filled 2016-11-09 (×3): qty 1

## 2016-11-09 MED ORDER — OXYCODONE-ACETAMINOPHEN 5-325 MG PO TABS
1.0000 | ORAL_TABLET | ORAL | Status: DC | PRN
  Administered 2016-11-09 (×2): 1 via ORAL
  Filled 2016-11-09 (×2): qty 1

## 2016-11-09 NOTE — Progress Notes (Signed)
Nurse paged on-call hospitalist to request order to place foley catheter due to patient having severe back pain. Patient reports pain much worse with rolling to get on and off bedpan. Nurse is waiting on phone call or order to be placed.

## 2016-11-09 NOTE — Evaluation (Signed)
Physical Therapy Evaluation Patient Details Name: Theresa Bauer MRN: [id number] DOB: 04-Jul-1929 Today's Date: 11/09/2016   History of Present Illness  83 yo female adm with back pain, inability to amb; see MRI for details  Clinical Impression  *Pt admitted with above diagnosis. Pt currently with functional limitations due to the deficits listed below (see PT Problem List).  Pt will benefit from skilled PT to increase their independence and safety with mobility to allow discharge to the venue listed below. Limited eval, will return to  Attempt to assess mobility    Follow Up Recommendations Other (comment) (TBD)    Equipment Recommendations  Rolling walker with 5" wheels (petite)    Recommendations for Other Services       Precautions / Restrictions Precautions Precautions: Fall      Mobility  Bed Mobility               General bed mobility comments: agreeable initially, then wanted to finish lunch prior to OOB  Transfers                    Ambulation/Gait                Stairs            Wheelchair Mobility    Modified Rankin (Stroke Patients Only)       Balance                                             Pertinent Vitals/Pain Pain Assessment: 0-10 Pain Score: 8  Pain Location: back Pain Descriptors / Indicators: Sore Pain Intervention(s): Limited activity within patient's tolerance;Monitored during session    Home Living Family/patient expects to be discharged to:: Private residence Living Arrangements: Alone   Type of Home: Apartment Home Access: Stairs to enter   Secretary/administrator of Steps: 1 Home Layout: One level   Additional Comments: very old RW and cane    Prior Function Level of Independence: Independent with assistive device(s)         Comments: recently using cane or RW d/t pain; 2 daughters in room who report they cannot help her at home     Hand Dominance        Extremity/Trunk  Assessment   Upper Extremity Assessment Upper Extremity Assessment: Generalized weakness    Lower Extremity Assessment Lower Extremity Assessment: Generalized weakness       Communication   Communication: No difficulties  Cognition Arousal/Alertness: Awake/alert Behavior During Therapy: WFL for tasks assessed/performed Overall Cognitive Status: Within Functional Limits for tasks assessed                                        General Comments      Exercises     Assessment/Plan    PT Assessment Patient needs continued PT services  PT Problem List Decreased strength;Decreased activity tolerance;Pain       PT Treatment Interventions DME instruction;Gait training;Functional mobility training;Therapeutic activities;Therapeutic exercise;Patient/family education    PT Goals (Current goals can be found in the Care Plan section)  Acute Rehab PT Goals Patient Stated Goal: none stated PT Goal Formulation: With patient Time For Goal Achievement: 11/16/16 Potential to Achieve Goals: Good    Frequency Min 3X/week   Barriers to  discharge Decreased caregiver support      Co-evaluation               AM-PAC PT "6 Clicks" Daily Activity  Outcome Measure Difficulty turning over in bed (including adjusting bedclothes, sheets and blankets)?: Unable Difficulty moving from lying on back to sitting on the side of the bed? : Unable Difficulty sitting down on and standing up from a chair with arms (e.g., wheelchair, bedside commode, etc,.)?: Unable Help needed moving to and from a bed to chair (including a wheelchair)?: A Little Help needed walking in hospital room?: A Little Help needed climbing 3-5 steps with a railing? : Total 6 Click Score: 10    End of Session   Activity Tolerance: Other (comment) (refused OOB at this time) Patient left: in bed;with call bell/phone within reach;with family/visitor present   PT Visit Diagnosis: Difficulty in walking, not  elsewhere classified (R26.2);Pain Pain - part of body:  (back)    Time: 1610-9604 PT Time Calculation (min) (ACUTE ONLY): 9 min   Charges:   PT Evaluation $PT Eval Low Complexity: 1 Low     PT G Codes:   PT G-Codes **NOT FOR INPATIENT CLASS** Functional Assessment Tool Used: AM-PAC 6 Clicks Basic Mobility;Clinical judgement Functional Limitation: Mobility: Walking and moving around Mobility: Walking and Moving Around Current Status ([id number]): At least 1 percent but less than 20 percent impaired, limited or restricted Mobility: Walking and Moving Around Goal Status [phone]): At least 1 percent but less than 20 percent impaired, limited or restricted    [place]   Drucilla Chalet 11/09/2016, 2:48 PM

## 2016-11-09 NOTE — Progress Notes (Signed)
PROGRESS NOTE  Theresa Bauer BJY:[id number] DOB: 1929/08/19 DOA: 11/08/2016 PCP: Fleet Contras, MD  HPI/Recap of past 24 hours:  Foley was put in overnight due to urinary retention,  She did not received enema ordered, she still has no bm She report lidoderm patch helped  Assessment/Plan: Active Problems:   Low back pain    Low back pain/radiation down to legs, not able to ambulate with constipation -her second visits to the ED this week due to the pain. she was able to ambulate a week ago, now she is not able to walk, not able to lift both legs against gravity -EDP discussed CT L spine obtained on 10/3  with neurosurgery oncall Dr Ditty/PA who recommended IR consult for possible intervention -MRI spine ordered due to not able to walk with constipation, she denies urinary problems , denies saddle anesthesia.  -mri spine showed significant degenerative disease, moderate urinary bladder distension -She received iv dilaudid x2 in the ED without significant improvement of pain ,it just made her drowsy. -she is started percocet , continue prn iv dilaudid. - she is started on neurontin and  trial dose of  IV steroids -Hold pharmacologic dvt prophylasix for now, in case IR plan for procedure.  -case discussed with IR  Dehydration, poor oral intake, hyponatremia, hypokalemia: Hold home meds HCTZ, replace k, check mag Continue hydration, Repeat bmp in am to decide further hydration and electrolytes replacement.  Urine retention:  Treat constipation, start flomax   Sever constipation She reports diffuse ab pain, no bm for a week, CT ab on 10/3 with constipation Enema, start stool softener  FTT: previously lives by self. She has h/o falls, she was told to use a walker since July this year, but she uses a cane most of the time. She has not been able to walk for a week. PT eval, she is open to rehab placement if indicated.   HTN; continue norvasc, hold HCTZ, add prn hydralazine for  sbp> 180    DVT prophylaxis: SCD for now, start lovenox if no intervention   Consultants:  EDP talked to neurosurgery Dr Ditty/PA who recommended IR consult IR consulted, case discussed with IR PA Caryn Bee, IR attending to review case on Monday and possible intervention, no need to be npo  Code Status: full   Family Communication:  Patient at bedside and daughter over the phone  Disposition Plan: med surg , observation   Procedures:  Possible epidural injection on monday  Antibiotics:  none   Objective: BP (!) 144/55 (BP Location: Right Arm)   Pulse 93   Temp 98.3 F (36.8 C) (Oral)   Resp 16   Ht 5' (1.524 m)   Wt 65.8 kg (145 lb)   SpO2 99%   BMI 28.32 kg/m   Intake/Output Summary (Last 24 hours) at 11/09/16 0854 Last data filed at 11/09/16 0753  Gross per 24 hour  Intake                0 ml  Output             1000 ml  Net            -1000 ml   Filed Weights   11/08/16 1424  Weight: 65.8 kg (145 lb)    Exam: Patient is examined daily including today on 11/09/2016, exams remain the same as of yesterday except that has changed    General:  NAD  Cardiovascular: RRR  Respiratory: CTABL  Abdomen: Soft/ND/NT, positive  BS  Musculoskeletal: No Edema  Neuro: alert, oriented , bilateral lower extremity now able to lift up a little bit at a angle less than 10 degrees.   Data Reviewed: Basic Metabolic Panel:  Recent Labs Lab 11/05/16 1156 11/08/16 1513 11/09/16 0515  NA 137 132* 137  K 3.1* 3.0* 4.1  CL 96* 91* 101  CO2 32 27 27  GLUCOSE 89 99 130*  BUN CREATININE 0.71 0.59 0.49  CALCIUM 9.0 8.7* 8.6*  MG  --   --  1.9   Liver Function Tests:  Recent Labs Lab 11/05/16 1156 11/08/16 1513  AST 19 23  ALT 11* 12*  ALKPHOS 53 54  BILITOT 0.9 1.1  PROT 6.8 7.0  ALBUMIN 3.4* 3.2*    Recent Labs Lab 11/05/16 1156 11/08/16 1513  LIPASE 25 19   No results for input(s): AMMONIA in the last 168 hours. CBC:  Recent  Labs Lab 11/05/16 1156 11/08/16 1512  WBC 6.1 7.3  NEUTROABS 3.0  --   HGB 10.2* 9.9*  HCT 31.5* 29.1*  MCV 67.0* 66.1*  PLT 250 265   Cardiac Enzymes:   No results for input(s): CKTOTAL, CKMB, CKMBINDEX, TROPONINI in the last 168 hours. BNP (last 3 results) No results for input(s): BNP in the last 8760 hours.  ProBNP (last 3 results) No results for input(s): PROBNP in the last 8760 hours.  CBG: No results for input(s): GLUCAP in the last 168 hours.  No results found for this or any previous visit (from the past 240 hour(s)).   Studies: Mr Lumbar Spine Wo Contrast  Result Date: 11/08/2016 CLINICAL DATA:  Back pain. Rapidly progressing neuro deficit. Pain extends into both lower extremities. She is now unable to ambulate. EXAM: MRI LUMBAR SPINE WITHOUT CONTRAST TECHNIQUE: Multiplanar, multisequence MR imaging of the lumbar spine was performed. No intravenous contrast was administered. COMPARISON:  CT of the lumbar spine 11/05/2016. FINDINGS: Segmentation: 5 non rib-bearing lumbar type vertebral bodies are present. Alignment: Grade 1 anterolisthesis is again noted at L4-5 measuring 6 mm. AP alignment is otherwise anatomic. Vertebrae:  Marrow signal and vertebral body heights are normal. Conus medullaris: Extends to the L1-2 level and appears normal. Paraspinal and other soft tissues: Limited imaging of the abdomen demonstrates a benign-appearing 7 mm cyst anteriorly in the left kidney. The urinary bladder is moderately distended. Disc levels: T11-12: A far left lateral disc protrusion may impact the exiting left T11 nerve root. The central canal is patent. T12-L1: Mild facet hypertrophy is present. There is no focal disc protrusion or stenosis. L1-2: A broad-based disc protrusion extends into the neural foramina bilaterally. Mild central and bilateral foraminal narrowing is present. L2-3: Short pedicles and a broad-based disc protrusion is present. Mild subarticular and foraminal narrowing  is present bilaterally. L3-4: Short pedicles and a broad-based disc protrusion are present. Facet hypertrophy is worse on the right. Moderate right and mild left subarticular narrowing is present. There is mild foraminal narrowing bilaterally. L4-5: There is uncovering of a broad-based disc protrusion. Advanced facet hypertrophy is noted bilaterally. This results in severe central canal stenosis. Moderate to severe foraminal narrowing is present bilaterally. L5-S1: A broad-based disc protrusion is present. Moderate facet hypertrophy is worse on the left. Moderate subarticular narrowing is worse on the left. Severe left and moderate right foraminal narrowing is present. IMPRESSION: 1. Severe central canal stenosis at L4-5 secondary to advanced facet hypertrophy, 6 mm anterolisthesis, and uncovering of a broad-based disc protrusion. 2. Moderate to  severe foraminal stenosis bilaterally at L4-5. 3. Moderate subarticular narrowing at L5-S1 with severe left and moderate right foraminal narrowing due to a broad-based disc protrusion and asymmetric facet hypertrophy. 4. Moderate right and mild left subarticular narrowing at L3-4. Mild foraminal narrowing is present bilaterally. 5. Mild central and bilateral foraminal narrowing at L1-2 and L2-3. 6. Far left lateral disc protrusion at T11-12. 7. Short pedicles contribute to stenosis throughout the lumbar spine. 8. Moderate distension of the urinary bladder. This may be related to the spinal stenosis. Electronically Signed   By: Marin Roberts M.D.   On: 11/08/2016 19:59    Scheduled Meds: . amLODipine  5 mg Oral Daily  . bisacodyl  10 mg Rectal Daily  . [START ON 11/10/2016] enoxaparin (LOVENOX) injection  40 mg Subcutaneous Q24H  . gabapentin  300 mg Oral QHS  . latanoprost  1 drop Both Eyes QHS  . lidocaine  1 patch Transdermal Q24H  . methylPREDNISolone (SOLU-MEDROL) injection  60 mg Intravenous Q24H  . mineral oil  1 enema Rectal Once  .  oxyCODONE-acetaminophen  1 tablet Oral Q8H  . pantoprazole  40 mg Oral Daily  . senna-docusate  1 tablet Oral QHS  . timolol  1 drop Both Eyes Daily    Continuous Infusions: . sodium chloride 75 mL/hr at 11/08/16 2258     Time spent: I have personally reviewed and interpreted on  11/09/2016 daily labs,  imagings as discussed above under date review session and assessment and plans.  I reviewed all nursing notes,  vitals, pertinent old records  I have discussed plan of care as described above with RN , patient and family on 11/09/2016   Sravya Grissom MD, PhD  [facility] Pager [phone]. If 7PM-7AM, please contact night-coverage at [email or web address], password [facility] 11/09/2016, 8:54 AM  LOS: 0 days

## 2016-11-09 NOTE — Progress Notes (Signed)
   11/09/16 1600  PT Visit Information  Last PT Received On 11/09/16  Subjective Data  Patient Stated Goal none stated  Precautions  Precautions Fall  Restrictions  Weight Bearing Restrictions No  Pain Assessment  Pain Assessment 0-10  Pain Score 3  Pain Location back  Pain Descriptors / Indicators Grimacing;Sore  Pain Intervention(s) Limited activity within patient's tolerance;Monitored during session;Repositioned  Cognition  Arousal/Alertness Awake/alert  Behavior During Therapy WFL for tasks assessed/performed  Overall Cognitive Status Within Functional Limits for tasks assessed  Bed Mobility  Overal bed mobility Needs Assistance  Bed Mobility Sit to Supine  Sit to supine Min guard  General bed mobility comments guarding LEs while lifting onto bed; incr time  Transfers  Overall transfer level Needs assistance  Equipment used Rolling walker (2 wheeled)  Transfers Sit to/from Stand  Sit to Stand Supervision  General transfer comment from bed; pt got up  from toilet without assist and completed her own hygiene  Ambulation/Gait  Ambulation/Gait assistance Min guard;Supervision  Ambulation Distance (Feet) 12 Feet  Assistive device Rolling walker (2 wheeled)  Gait Pattern/deviations Step-to pattern;Step-through pattern;Decreased stride length  General Gait Details cues for RW safety; pt refused to amb futher d/t having had suppository  PT - End of Session  Equipment Utilized During Treatment Gait belt  Activity Tolerance Patient tolerated treatment well  Patient left in bed;with call bell/phone within reach;with bed alarm set;with family/visitor present  PT - Assessment/Plan  PT Plan Current plan remains appropriate  PT Visit Diagnosis Difficulty in walking, not elsewhere classified (R26.2);Pain  Pain - part of body (back)  PT Frequency (ACUTE ONLY) Min 3X/week  Follow Up Recommendations Home health PT  PT equipment Rolling walker with 5" wheels  AM-PAC PT "6 Clicks" Daily  Activity Outcome Measure  Difficulty turning over in bed (including adjusting bedclothes, sheets and blankets)? 3  Difficulty moving from lying on back to sitting on the side of the bed?  3  Difficulty sitting down on and standing up from a chair with arms (e.g., wheelchair, bedside commode, etc,.)? 3  Help needed moving to and from a bed to chair (including a wheelchair)? 3  Help needed walking in hospital room? 3  Help needed climbing 3-5 steps with a railing?  3  6 Click Score 18  Mobility G Code  CK  PT Goal Progression  Progress towards PT goals Progressing toward goals  Acute Rehab PT Goals  PT Goal Formulation With patient  Time For Goal Achievement 11/16/16  Potential to Achieve Goals Good  PT Time Calculation  PT Start Time (ACUTE ONLY) 1452  PT Stop Time (ACUTE ONLY) 1514  PT Time Calculation (min) (ACUTE ONLY) 22 min  PT General Charges  $$ ACUTE PT VISIT 1 Visit  PT Treatments  $Gait Training 8-22 mins

## 2016-11-10 ENCOUNTER — Other Ambulatory Visit: Payer: Self-pay | Admitting: Neurological Surgery

## 2016-11-10 LAB — CBC WITH DIFFERENTIAL/PLATELET
BASOS PCT: 0 %
Basophils Absolute: 0 10*3/uL (ref 0.0–0.1)
EOS ABS: 0 10*3/uL (ref 0.0–0.7)
EOS PCT: 0 %
HEMATOCRIT: 31.1 % — AB (ref 36.0–46.0)
HEMOGLOBIN: 10.2 g/dL — AB (ref 12.0–15.0)
LYMPHS PCT: 12 %
Lymphs Abs: 0.8 10*3/uL (ref 0.7–4.0)
MCH: 22 pg — AB (ref 26.0–34.0)
MCHC: 32.8 g/dL (ref 30.0–36.0)
MCV: 67 fL — AB (ref 78.0–100.0)
MONOS PCT: 2 %
Monocytes Absolute: 0.1 10*3/uL (ref 0.1–1.0)
NEUTROS ABS: 5.4 10*3/uL (ref 1.7–7.7)
NEUTROS PCT: 86 %
Platelets: 337 10*3/uL (ref 150–400)
RBC: 4.64 MIL/uL (ref 3.87–5.11)
RDW: 17.4 % — ABNORMAL HIGH (ref 11.5–15.5)
WBC: 6.3 10*3/uL (ref 4.0–10.5)

## 2016-11-10 LAB — FOLATE: Folate: 16.9 ng/mL (ref >5.9)

## 2016-11-10 LAB — BASIC METABOLIC PANEL
Anion gap: 10 (ref 5–15)
BUN: 12 mg/dL (ref 6–20)
CO2: 27 mmol/L (ref 22–32)
CREATININE: 0.65 mg/dL (ref 0.44–1.00)
Calcium: 8.6 mg/dL — ABNORMAL LOW (ref 8.9–10.3)
Chloride: 100 mmol/L — ABNORMAL LOW (ref 101–111)
GFR calc Af Amer: >60 mL/min (ref >60)
GFR calc non Af Amer: >60 mL/min (ref >60)
Glucose, Bld: 170 mg/dL — ABNORMAL HIGH (ref 65–99)
POTASSIUM: 3.6 mmol/L (ref 3.5–5.1)
SODIUM: 137 mmol/L (ref 135–145)

## 2016-11-10 LAB — IRON AND TIBC
Iron: 11 ug/dL — ABNORMAL LOW (ref 28–170)
SATURATION RATIOS: 4 % — AB (ref 10.4–31.8)
TIBC: 259 ug/dL (ref 250–450)
UIBC: 248 ug/dL

## 2016-11-10 LAB — VITAMIN B12: VITAMIN B 12: 1316 pg/mL — AB (ref 180–914)

## 2016-11-10 LAB — MAGNESIUM: MAGNESIUM: 2 mg/dL (ref 1.7–2.4)

## 2016-11-10 MED ORDER — ZOLPIDEM TARTRATE 5 MG PO TABS
5.0000 mg | ORAL_TABLET | Freq: Every evening | ORAL | Status: DC | PRN
  Administered 2016-11-11: 5 mg via ORAL
  Filled 2016-11-10: qty 1

## 2016-11-10 MED ORDER — POTASSIUM CHLORIDE CRYS ER 20 MEQ PO TBCR
40.0000 meq | EXTENDED_RELEASE_TABLET | Freq: Once | ORAL | Status: AC
  Administered 2016-11-10: 40 meq via ORAL
  Filled 2016-11-10: qty 2

## 2016-11-10 MED ORDER — BISACODYL 10 MG RE SUPP
10.0000 mg | Freq: Every day | RECTAL | Status: DC
  Administered 2016-11-11: 11:00:00 10 mg via RECTAL
  Filled 2016-11-10 (×2): qty 1

## 2016-11-10 MED ORDER — ACETAMINOPHEN 325 MG PO TABS
650.0000 mg | ORAL_TABLET | Freq: Four times a day (QID) | ORAL | Status: DC | PRN
  Administered 2016-11-10: 650 mg via ORAL
  Filled 2016-11-10: qty 2

## 2016-11-10 MED ORDER — METOPROLOL TARTRATE 12.5 MG HALF TABLET
12.5000 mg | ORAL_TABLET | Freq: Two times a day (BID) | ORAL | Status: DC
  Administered 2016-11-10 – 2016-11-12 (×5): 12.5 mg via ORAL
  Filled 2016-11-10 (×5): qty 1

## 2016-11-10 NOTE — Progress Notes (Signed)
Physical Therapy Treatment Patient Details Name: Theresa Bauer MRN: [id number] DOB: 04/10/1929 Today's Date: 11/10/2016    History of Present Illness 83 yo female adm with back pain, inability to amb; see MRI for details    PT Comments    Pt in bed required MAX encouragement to participate.  Had multiple reasons and excuses.  Eventually did assist pt OOB to amb a great distance with a walker.  Also assisted to bathroom then positioned in recliner.  Pt moving at Supervision level and demonstrates good safety cognition and use of hands to steady self.  Pt required NO physical Assist.     Follow Up Recommendations  Home health PT     Equipment Recommendations  Rolling walker with 5" wheels;Hospital bed (daughter insistant to get a hospital bed )    Recommendations for Other Services       Precautions / Restrictions Precautions Precautions: Fall Restrictions Weight Bearing Restrictions: No    Mobility  Bed Mobility Overal bed mobility: Modified Independent             General bed mobility comments: only required increased time   Able to self perform  Transfers Overall transfer level: Needs assistance Equipment used: None Transfers: Sit to/from Stand;Stand Pivot Transfers Sit to Stand: Supervision Stand pivot transfers: Supervision       General transfer comment: VC's only for direction          Good safety cognition         Good use of hands to steady self  Ambulation/Gait Ambulation/Gait assistance: Supervision Ambulation Distance (Feet): 85 Feet Assistive device: Rolling walker (2 wheeled) Gait Pattern/deviations: Step-through pattern Gait velocity: WFL   General Gait Details: VC's only for direction     Good safety cognition   Stairs            Wheelchair Mobility    Modified Rankin (Stroke Patients Only)       Balance                                            Cognition Arousal/Alertness: Awake/alert Behavior During  Therapy: WFL for tasks assessed/performed Overall Cognitive Status: Within Functional Limits for tasks assessed                                 General Comments: required MAX encouragement to participate (multiple excuses but eventually agreed)       Exercises      General Comments        Pertinent Vitals/Pain Pain Assessment: No/denies pain    Home Living                      Prior Function            PT Goals (current goals can now be found in the care plan section) Progress towards PT goals: Progressing toward goals    Frequency           PT Plan Current plan remains appropriate    Co-evaluation              AM-PAC PT "6 Clicks" Daily Activity  Outcome Measure  Difficulty turning over in bed (including adjusting bedclothes, sheets and blankets)?: None Difficulty moving from lying on back to sitting on the side of the bed? :  None Difficulty sitting down on and standing up from a chair with arms (e.g., wheelchair, bedside commode, etc,.)?: None Help needed moving to and from a bed to chair (including a wheelchair)?: None Help needed walking in hospital room?: None Help needed climbing 3-5 steps with a railing? : A Little 6 Click Score: 23    End of Session Equipment Utilized During Treatment: Gait belt Activity Tolerance: Patient tolerated treatment well Patient left: in chair;with call bell/phone within reach Nurse Communication: Mobility status PT Visit Diagnosis: Difficulty in walking, not elsewhere classified (R26.2);Pain     Time: 1610-9604 PT Time Calculation (min) (ACUTE ONLY): 24 min  Charges:  $Gait Training: 8-22 mins $Therapeutic Activity: 8-22 mins                    G Codes:       Felecia Shelling  PTA [facility]  Acute  Rehab Pager      [phone]

## 2016-11-10 NOTE — Care Management Note (Signed)
Case Management Note  Patient Details  Name: Theresa Bauer MRN: [id number] Date of Birth: [birth year]  Subjective/Objective:  83 y/o f admitted w/low back pain. From home alone. Patient states there are bed bugs in home,she states the landlord will have the home sprayed. She may need HHPT[facility] rep Clydie Braun will check if able to accept. Patient needs rw,shower [facility] rep Clydie Braun aware.                 Action/Plan:d/c plan home.   Expected Discharge Date:                  Expected Discharge Plan:  Home/Self Care  In-House Referral:     Discharge planning Services  CM Consult  Post Acute Care Choice:    Choice offered to:     DME Arranged:    DME Agency:     HH Arranged:    HH Agency:     Status of Service:  In process, will continue to follow  If discussed at Long Length of Stay Meetings, dates discussed:    Additional Comments:  Lanier Clam, RN 11/10/2016, 3:46 PM

## 2016-11-10 NOTE — Progress Notes (Signed)
LCSW received call from RN and consult for discharge planning  Patient is from home alone per daughter. Recommendations at this time remain for Home Health.  Explained to daughter who is in agreement with plan, but was hopeful for SNF. Patient reports she wants to go home and will do therapy when asked.  LCSW made CM aware of plans to go home. LCSW will sign off at this time.  Theresa Bauer, MSW Clinical Social Work: Optician, dispensing Coverage for :  [phone]

## 2016-11-10 NOTE — Progress Notes (Signed)
PROGRESS NOTE  Theresa Bauer MVH:[id number] DOB: 1929/09/23 DOA: 11/08/2016 PCP: Fleet Contras, MD  HPI/Recap of past 24 hours:  She is feeling better, she had bmx2 yesterday , she reports able to get up with physical therapy She is concerned that her blood pressure is elevated She denies urinary difficulties She is hesitant about epidural injection, she is hesitant about spine surgery   Assessment/Plan: Active Problems:   Low back pain    Low back pain/radiation down to legs, not able to ambulate with constipation -her second visits to the ED this week due to the pain. she was able to ambulate a week ago, now she is not able to walk, not able to lift both legs against gravity -EDP discussed CT L spine obtained on 10/3  with neurosurgery oncall Dr Ditty/PA who recommended IR consult for possible intervention -MRI spine ordered due to not able to walk with constipation, she denies urinary problems , denies saddle anesthesia.  -mri spine showed significant degenerative disease, moderate urinary bladder distension -She received iv dilaudid x2 in the ED without significant improvement of pain ,it just made her drowsy. -she is started percocet , continue prn iv dilaudid. - she is started on neurontin and  trial dose of  IV steroids -Hold pharmacologic dvt prophylasix for now, in case patient change her mind about  procedure.  -case discussed with IR/neurosurgery, initially plan to transfer to [facility] for spine surgery, patient currently is hesitating with IR procedure or spine surgery.  -likely d/c home with home health, steroids taper, with outpatient spine surgery follow up if she does not change her mind again.   Dehydration, poor oral intake, hyponatremia, hypokalemia: Hold home meds HCTZ, replace k, check mag Continue hydration, Repeat bmp in am to decide further hydration and electrolytes replacement.  Urine retention:  Treat constipation, start flomax   Sever  constipation She reports diffuse ab pain, no bm for a week, CT ab on 10/3 with constipation Enema, start stool softener  FTT: previously lives by self. She has h/o falls, she was told to use a walker since July this year, but she uses a cane most of the time. She has not been able to walk for a week. PT eval, she is open to rehab placement if indicated.   HTN; continue norvasc, hold HCTZ, add prn hydralazine for sbp> 180    DVT prophylaxis: SCD for now, start lovenox if no intervention   Consultants:  EDP talked to neurosurgery Dr Ditty/PA who recommended IR consult IR consulted, case discussed with IR PA Caryn Bee, IR attending to review case on Monday and possible intervention, no need to be npo  Code Status: full   Family Communication:  Patient at bedside and daughter over the phone  Disposition Plan: med surg   Procedures:  none  Antibiotics:  none   Objective: BP (!) 149/74 (BP Location: Right Arm)   Pulse (!) 110   Temp (!) 97.5 F (36.4 C) (Oral)   Resp 18   Ht 5' (1.524 m)   Wt 65.8 kg (145 lb)   SpO2 99%   BMI 28.32 kg/m   Intake/Output Summary (Last 24 hours) at 11/10/16 0847 Last data filed at 11/10/16 0011  Gross per 24 hour  Intake           1907.5 ml  Output              200 ml  Net  1707.5 ml   Filed Weights   11/08/16 1424  Weight: 65.8 kg (145 lb)    Exam: Patient is examined daily including today on 11/10/2016, exams remain the same as of yesterday except that has changed    General:  NAD  Cardiovascular: RRR  Respiratory: CTABL  Abdomen: Soft/ND/NT, positive BS  Musculoskeletal: No Edema  Neuro: alert, oriented , bilateral lower extremity now able to lift up more at a angle less than 30 degrees.   Data Reviewed: Basic Metabolic Panel:  Recent Labs Lab 11/05/16 1156 11/08/16 1513 11/09/16 0515 11/10/16 0540  NA 137 132* 137 137  K 3.1* 3.0* 4.1 3.6  CL 96* 91* 101 100*  CO2 32 GLUCOSE 89 99  130* 170*  BUN CREATININE 0.71 0.59 0.49 0.65  CALCIUM 9.0 8.7* 8.6* 8.6*  MG  --   --  1.9 2.0   Liver Function Tests:  Recent Labs Lab 11/05/16 1156 11/08/16 1513  AST 19 23  ALT 11* 12*  ALKPHOS 53 54  BILITOT 0.9 1.1  PROT 6.8 7.0  ALBUMIN 3.4* 3.2*    Recent Labs Lab 11/05/16 1156 11/08/16 1513  LIPASE 25 19   No results for input(s): AMMONIA in the last 168 hours. CBC:  Recent Labs Lab 11/05/16 1156 11/08/16 1512 11/10/16 0540  WBC 6.1 7.3 6.3  NEUTROABS 3.0  --  5.4  HGB 10.2* 9.9* 10.2*  HCT 31.5* 29.1* 31.1*  MCV 67.0* 66.1* 67.0*  PLT 250 265 337   Cardiac Enzymes:   No results for input(s): CKTOTAL, CKMB, CKMBINDEX, TROPONINI in the last 168 hours. BNP (last 3 results) No results for input(s): BNP in the last 8760 hours.  ProBNP (last 3 results) No results for input(s): PROBNP in the last 8760 hours.  CBG: No results for input(s): GLUCAP in the last 168 hours.  No results found for this or any previous visit (from the past 240 hour(s)).   Studies: No results found.  Scheduled Meds: . amLODipine  5 mg Oral Daily  . bisacodyl  10 mg Rectal Daily  . gabapentin  300 mg Oral QHS  . latanoprost  1 drop Both Eyes QHS  . lidocaine  1 patch Transdermal Q24H  . methylPREDNISolone (SOLU-MEDROL) injection  60 mg Intravenous Q24H  . pantoprazole  40 mg Oral Daily  . senna-docusate  1 tablet Oral QHS  . tamsulosin  0.4 mg Oral Daily  . timolol  1 drop Both Eyes Daily    Continuous Infusions:    Time spent: , more than 50% time spent on coordination of care. I have personally reviewed and interpreted on  11/10/2016 daily labs,  imagings as discussed above under date review session and assessment and plans.  I reviewed all nursing notes,  vitals, pertinent old records  I have discussed plan of care as described above with RN , patient and family on 11/10/2016  Case discussed with IR and neurosurgery  Tommy Minichiello MD, PhD  [facility] Pager [phone]. If 7PM-7AM, please contact night-coverage at [email or web address], password [facility] 11/10/2016, 8:47 AM  LOS: 1 day

## 2016-11-11 DIAGNOSIS — I10 Essential (primary) hypertension: Secondary | ICD-10-CM

## 2016-11-11 MED ORDER — BISACODYL 10 MG RE SUPP: 10.0000 mg | Freq: Every day | RECTAL | 0 refills | Status: DC

## 2016-11-11 MED ORDER — GABAPENTIN 300 MG PO CAPS: 300.0000 mg | ORAL_CAPSULE | Freq: Two times a day (BID) | ORAL | 0 refills | Status: DC

## 2016-11-11 MED ORDER — MAGNESIUM OXIDE 400 MG PO TABS: 400.0000 mg | ORAL_TABLET | Freq: Every day | ORAL | 0 refills | Status: DC

## 2016-11-11 MED ORDER — PREDNISONE 10 MG PO TABS: ORAL_TABLET | ORAL | 0 refills | Status: DC

## 2016-11-11 MED ORDER — SENNOSIDES-DOCUSATE SODIUM 8.6-50 MG PO TABS: 1.0000 | ORAL_TABLET | Freq: Every day | ORAL | 0 refills | Status: AC

## 2016-11-11 MED ORDER — METOPROLOL TARTRATE 25 MG PO TABS: 12.5000 mg | ORAL_TABLET | Freq: Two times a day (BID) | ORAL | 0 refills | Status: DC

## 2016-11-11 MED ORDER — BISACODYL 10 MG RE SUPP: 10.0000 mg | Freq: Every day | RECTAL | 0 refills | Status: DC | PRN

## 2016-11-11 NOTE — Discharge Summary (Signed)
Discharge Summary  Theresa Bauer ZOX:[id number] DOB: 12/20/1929  PCP: Theresa Contras, MD  Admit date: 11/08/2016 Discharge date: 11/11/2016  Time spent: >77mins, more than 50% time spent on coordination of care.  Recommendations for Outpatient Follow-up:  1. F/u with PMD within a week  for hospital discharge follow up, repeat cbc/bmp at follow up 2. F/u with neurosurgery 3. Home health /equipement arranged  Discharge Diagnoses:  Active Hospital Problems   Diagnosis Date Noted  . Low back pain 11/08/2016    Resolved Hospital Problems   Diagnosis Date Noted Date Resolved  No resolved problems to display.    Discharge Condition: stable  Diet recommendation: heart healthy  Filed Weights   11/08/16 1424  Weight: 65.8 kg (145 lb)    History of present illness:  PCP: Theresa Contras, MD   Chief Complaint: low back pain, not able to ambulate, poor oral intake  HPI: Theresa Bauer is a 83 y.o. female   With h/o HTN, Branch retinal artery occlusion, left eye ( was on plavix, but she reports she was only on it briefly), h/o osteoporosis, h/o chronic back pain ( report received epidural injection in 08/2016 which did not help).  She has been having increasing back pain for the last week, she was seen in the ED on 10/3, ct renal stone/ct lumber spine obtained, she is prescribed prn pain meds and muscle relaxer and discharged home. However, her back pain become worse, pain radiation down to both legs, she has not been able to ambulate for the last few days. She presented to the ED Again today, she received iv dilaudidx2 in the ED with minimal improvement of the pain, basic labs showed hyponatremia, hypokalemia. EDP discussed case with neurosurgery who recommend admit to the hospital and IR consult for possible intervention.   Hospitalisted called to admit the patient. She denies fever, no chest pain, no hypoxia. Her vital is stable. She reports no bm for a week, she denies urinary  difficulties.    Hospital Course:  Active Problems:   Low back pain   Low back pain/radiation down to legs, not able to ambulate with constipation/urinary retention -her second visits to the ED this week due to the pain. she was able to ambulate a week ago, now sheis not able to walk, not able to lift both legs against gravity -EDP discussed CT L spine obtained on 10/3 with neurosurgery oncall Dr Ditty/PA who recommended IR consult for possible intervention -MRI spine ordered due to not able to walk with constipation, she denies urinary problems , denies saddle anesthesia.  -mri spine showed significant degenerative disease, moderate urinary bladder distension -She received iv dilaudid x2 in the ED without significant improvement of pain ,it just made her drowsy. -she is started percocet , continue prn iv dilaudid. - she is started on neurontin and  trial dose of  IV steroids -patient declined epidural injection.  -case discussed with IR/neurosurgery, initially plan to transfer to [place] for spine surgery, patient currently is hesitating with IR procedure or spine surgery.  -she showed some improvement want to try conservative management with steroid, neurontin, prn analgesics -she improved, physical therapy recommended home health with DME which is arranged -she is discharged on steroids taper, neurontin, prn flexeril, analgesics. -she is to follow up with pmd /neurosurgery.   Dehydration, poor oral intake, hyponatremia, hypokalemia: -home meds HCTZ held during hospitalization, discontinued at discharge,  -she received hydration in the hospital - k replaced, ,  Mag 1.9-2.  Urine retention: Treat constipation, start flomax Resolved, repeat bladder scan with 29cc.  Avoid constipation.   Sever constipation She reports diffuse ab pain, no bm for a week, CT ab on 10/3 with constipation Enema, start stool softener Resolved.  [id number] lives by self. She has h/o  falls, she was told to use a walker since July this year, but she uses a cane most of the time. She has not been able to walk for a week. Improved, she is discharged home with home health.  HTN; Home meds  Norvasc discontinued ( due c/o intermittent lower extremity edema, no edema on exam in the hospital) Home meds HCTZ discontinued She is started on lopressor and discharged on lopressor.     DVT prophylaxis in the hospital: SCD   Consultants: -Neurosurgery Dr Bevely Palmer -Interventional radiology  Code Status:full   Family Communication:Patient at bedside and daughter at bedside  Disposition Plan:home with home health  Procedures:  none  Antibiotics:  none    Discharge Exam: BP (!) 129/50   Pulse 90   Temp 97.8 F (36.6 C) (Oral)   Resp 20   Ht 5' (1.524 m)   Wt 65.8 kg (145 lb)   SpO2 100%   BMI 28.32 kg/m   General: NAD Cardiovascular: RRR Respiratory: CTABL Extremities: no edema Neuro: aaox3, bilateral lower extremity has improved strength, now able to ambulate with a walker. ( both her legs were not able to lift against gravity on presentation)  Discharge Instructions You were cared for by a hospitalist during your hospital stay. If you have any questions about your discharge medications or the care you received while you were in the hospital after you are discharged, you can call the unit and asked to speak with the hospitalist on call if the hospitalist that took care of you is not available. Once you are discharged, your primary care physician will handle any further medical issues. Please note that NO REFILLS for any discharge medications will be authorized once you are discharged, as it is imperative that you return to your primary care physician (or establish a relationship with a primary care physician if you do not have one) for your aftercare needs so that they can reassess your need for medications and monitor your lab values.  Discharge  Instructions    DME Hospital bed    Complete by:  As directed    Patient has (list medical condition):  severe spine degenerative changes affect ambulation   The above medical condition requires:  Patient requires the ability to reposition frequently   Head must be elevated greater than:  30 degrees   Bed type:  Semi-electric   Diet - low sodium heart healthy    Complete by:  As directed    Face-to-face encounter (required for Medicare/Medicaid patients)    Complete by:  As directed    I Ezekeil Bethel certify that this patient is under my care and that I, or a nurse practitioner or physician's assistant working with me, had a face-to-face encounter that meets the physician face-to-face encounter requirements with this patient on 11/10/2016. The encounter with the patient was in whole, or in part for the following medical condition(s) which is the primary reason for home health care (List medical condition): FTT   The encounter with the patient was in whole, or in part, for the following medical condition, which is the primary reason for home health care:  FTT   I certify that, based on my findings, the following services  are medically necessary home health services:   Nursing Physical therapy     Reason for Medically Necessary Home Health Services:  Skilled Nursing- Change/Decline in Patient Status   My clinical findings support the need for the above services:  Pain interferes with ambulation/mobility   Further, I certify that my clinical findings support that this patient is homebound due to:  Pain interferes with ambulation/mobility   Home Health    Complete by:  As directed    To provide the following care/treatments:   PT OT Social work [employer] Aide     Increase activity slowly    Complete by:  As directed      Allergies as of 11/11/2016      Reactions   Lisinopril Anaphylaxis   Darvocet [propoxyphene N-acetaminophen] Itching   Aspirin    itching   Brimonidine Itching    Penicillins    Itching Has patient had a PCN reaction causing immediate rash, facial/tongue/throat swelling, SOB or lightheadedness with hypotension: no Has patient had a PCN reaction causing severe rash involving mucus membranes or skin necrosis: no Has patient had a PCN reaction that required hospitalization: no Has patient had a PCN reaction occurring within the last 10 years: no If all of the above answers are "NO", then may proceed with Cephalosporin use.   Darvon [propoxyphene] Itching      Medication List    STOP taking these medications   acetaminophen-codeine 300-30 MG tablet Commonly known as:  TYLENOL #3   amLODipine 5 MG tablet Commonly known as:  NORVASC   hydrochlorothiazide 25 MG tablet Commonly known as:  HYDRODIURIL   Hydrocodone-Acetaminophen 2.5-325 MG Tabs     TAKE these medications   acetaminophen 325 MG tablet Commonly known as:  TYLENOL Take 2 tablets (650 mg total) by mouth every 6 (six) hours as needed for mild pain or moderate pain.   alendronate 70 MG tablet Commonly known as:  FOSAMAX Take 70 mg by mouth every 7 (seven) days. Take with a full glass of water on an empty stomach.   bisacodyl 10 MG suppository Commonly known as:  DULCOLAX Place 1 suppository (10 mg total) rectally daily as needed for moderate constipation.   celecoxib 100 MG capsule Commonly known as:  CELEBREX Take 1 capsule (100 mg total) by mouth 2 (two) times daily.   cyclobenzaprine 5 MG tablet Commonly known as:  FLEXERIL Take 1 tablet (5 mg total) by mouth 3 (three) times daily as needed.   gabapentin 300 MG capsule Commonly known as:  NEURONTIN Take 1 capsule (300 mg total) by mouth 2 (two) times daily. What changed:  when to take this   lidocaine 5 % Commonly known as:  LIDODERM Place 1 patch onto the skin daily. Remove & Discard patch within 12 hours or as directed by MD   loratadine 10 MG tablet Commonly known as:  CLARITIN Take 10 mg by mouth daily.     magnesium oxide 400 MG tablet Commonly known as:  MAG-OX Take 1 tablet (400 mg total) by mouth daily.   metoprolol tartrate 25 MG tablet Commonly known as:  LOPRESSOR Take 0.5 tablets (12.5 mg total) by mouth 2 (two) times daily.   multivitamins ther. w/minerals Tabs tablet Take 1 tablet by mouth daily.   pantoprazole 40 MG tablet Commonly known as:  PROTONIX Take 1 tablet (40 mg total) by mouth daily.   predniSONE 10 MG tablet Commonly known as:  DELTASONE Label  & dispense according to  the schedule below. 6 Pills PO on day one then, 5 Pills PO on day two, 4 Pills PO on day three, 3Pills PO on day four, 2 Pills PO on day five, 1 Pills PO on day six,  then STOP.  Total of 21 tabs   senna-docusate 8.6-50 MG tablet Commonly known as:  Senokot-S Take 1 tablet by mouth at bedtime.   timolol 0.5 % ophthalmic solution Commonly known as:  TIMOPTIC Place 1 drop into both eyes daily.   traMADol 50 MG tablet Commonly known as:  ULTRAM Take 1 tablet (50 mg total) by mouth every 6 (six) hours as needed.   Travoprost (BAK Free) 0.004 % Soln ophthalmic solution Commonly known as:  TRAVATAN Place 1 drop into both eyes at bedtime.            Durable Medical Equipment        Start     Ordered   11/11/16 1319  For home use only DME Tub bench  Once    Comments:  Transfer tub bench   11/11/16 1319   11/11/16 0000  DME Hospital bed    Question Answer Comment  Patient has (list medical condition): severe spine degenerative changes affect ambulation   The above medical condition requires: Patient requires the ability to reposition frequently   Head must be elevated greater than: 30 degrees   Bed type Semi-electric      11/11/16 1301   11/10/16 1606  For home use only DME Other see comment  Once    Comments:  Shower chair   11/10/16 1605   11/10/16 1605  For home use only DME Walker rolling  Once    Question:  Patient needs a walker to treat with the following condition  Answer:   Unsteady gait   11/10/16 1605     Allergies  Allergen Reactions  . Lisinopril Anaphylaxis  . Darvocet [Propoxyphene N-Acetaminophen] Itching  . Aspirin     itching  . Brimonidine Itching  . Penicillins     Itching Has patient had a PCN reaction causing immediate rash, facial/tongue/throat swelling, SOB or lightheadedness with hypotension: no Has patient had a PCN reaction causing severe rash involving mucus membranes or skin necrosis: no Has patient had a PCN reaction that required hospitalization: no Has patient had a PCN reaction occurring within the last 10 years: no If all of the above answers are "NO", then may proceed with Cephalosporin use.   . Darvon [Propoxyphene] Itching   Follow-up Information    Theresa Contras, MD Follow up in 1 week(s).   Specialty:  Internal Medicine Why:  hospital discharge follow up, repeat cbc/bmp at follow up Contact information: [address] [place] [address] [phone]        Ditty, Loura Halt, MD Follow up in 2 week(s).   Specialty:  Neurosurgery Why:  to discuss spine surgery Contact information: [address] [place] [address] [phone]        [facility] - Dme Follow up.   Why:  home rolling walker,transfer tub bench,hospital bed Contact information: [address] [place] [address] [phone]        [facility] Follow up.   Specialty:  Home Health Services Why:  [facility] RN/PT/OT/aide/social worker Contact information: [address] [place] [address] [phone]            The results of significant diagnostics from this hospitalization (including imaging, microbiology, ancillary  and laboratory) are listed below for reference.    Significant Diagnostic Studies: Dg Chest 2 View  Result Date: 11/05/2016 CLINICAL DATA:  Epigastric pain EXAM: CHEST  2 VIEW COMPARISON:  04/10/2016 FINDINGS: Mild cardiomegaly that is stable. Stable aortic  and hilar contours. Mild haziness at the bases that is from atelectasis based on contemporaneous abdominal CT. There is no edema, consolidation, effusion, or pneumothorax. No acute osseous finding. IMPRESSION: 1. Mild atelectasis at the bases. 2. Mild cardiomegaly without edema. Electronically Signed   By: Marnee Spring M.D.   On: 11/05/2016 12:36   Mr Lumbar Spine Wo Contrast  Result Date: 11/08/2016 CLINICAL DATA:  Back pain. Rapidly progressing neuro deficit. Pain extends into both lower extremities. She is now unable to ambulate. EXAM: MRI LUMBAR SPINE WITHOUT CONTRAST TECHNIQUE: Multiplanar, multisequence MR imaging of the lumbar spine was performed. No intravenous contrast was administered. COMPARISON:  CT of the lumbar spine 11/05/2016. FINDINGS: Segmentation: 5 non rib-bearing lumbar type vertebral bodies are present. Alignment: Grade 1 anterolisthesis is again noted at L4-5 measuring 6 mm. AP alignment is otherwise anatomic. Vertebrae:  Marrow signal and vertebral body heights are normal. Conus medullaris: Extends to the L1-2 level and appears normal. Paraspinal and other soft tissues: Limited imaging of the abdomen demonstrates a benign-appearing 7 mm cyst anteriorly in the left kidney. The urinary bladder is moderately distended. Disc levels: T11-12: A far left lateral disc protrusion may impact the exiting left T11 nerve root. The central canal is patent. T12-L1: Mild facet hypertrophy is present. There is no focal disc protrusion or stenosis. L1-2: A broad-based disc protrusion extends into the neural foramina bilaterally. Mild central and bilateral foraminal narrowing is present. L2-3: Short pedicles and a broad-based disc protrusion is present. Mild subarticular and foraminal narrowing is present bilaterally. L3-4: Short pedicles and a broad-based disc protrusion are present. Facet hypertrophy is worse on the right. Moderate right and mild left subarticular narrowing is present. There is mild  foraminal narrowing bilaterally. L4-5: There is uncovering of a broad-based disc protrusion. Advanced facet hypertrophy is noted bilaterally. This results in severe central canal stenosis. Moderate to severe foraminal narrowing is present bilaterally. L5-S1: A broad-based disc protrusion is present. Moderate facet hypertrophy is worse on the left. Moderate subarticular narrowing is worse on the left. Severe left and moderate right foraminal narrowing is present. IMPRESSION: 1. Severe central canal stenosis at L4-5 secondary to advanced facet hypertrophy, 6 mm anterolisthesis, and uncovering of a broad-based disc protrusion. 2. Moderate to severe foraminal stenosis bilaterally at L4-5. 3. Moderate subarticular narrowing at L5-S1 with severe left and moderate right foraminal narrowing due to a broad-based disc protrusion and asymmetric facet hypertrophy. 4. Moderate right and mild left subarticular narrowing at L3-4. Mild foraminal narrowing is present bilaterally. 5. Mild central and bilateral foraminal narrowing at L1-2 and L2-3. 6. Far left lateral disc protrusion at T11-12. 7. Short pedicles contribute to stenosis throughout the lumbar spine. 8. Moderate distension of the urinary bladder. This may be related to the spinal stenosis. Electronically Signed   By: Marin Roberts M.D.   On: 11/08/2016 19:59   Ct L-spine No Charge  Result Date: 11/05/2016 CLINICAL DATA:  Initial evaluation for acute right-sided back pain EXAM: CT LUMBAR SPINE WITHOUT CONTRAST TECHNIQUE: Multidetector CT imaging of the lumbar spine was performed without intravenous contrast administration. Multiplanar CT image reconstructions were also generated. COMPARISON:  Prior MRI from 12/04/2015. FINDINGS: Segmentation: Normal segmentation. Lowest well-formed disc labeled the L5-S1 level. Alignment: Mild levoscoliosis  with apex at L2. 5 mm anterolisthesis of L4 on L5. Trace retrolisthesis of L1 on L2. Alignment is stable from previous MRI.  Vertebrae: Vertebral body heights are maintained. No evidence for acute or interval fracture. No discrete or worrisome osseous lesions. Visualized sacrum intact. Paraspinal and other soft tissues: Paraspinous soft tissues within normal limits. Visualized visceral structures normal. Aortic atherosclerosis. Atelectatic changes noted within the visualized lungs. Disc levels: T12-L1: Mild diffuse disc bulge. Moderate facet hypertrophy, right greater than left. No stenosis. L1-2: Trace retrolisthesis. Mild circumferential disc bulge. Bilateral facet hypertrophy. Mild canal stenosis with mild to moderate bilateral foraminal narrowing, stable from previous. L2-3: Diffuse disc bulge with disc desiccation. Mild bilateral facet hypertrophy. Moderate bilateral foraminal stenosis, left greater than right. Mild canal and subarticular stenosis, stable. L3-4: Diffuse disc bulge. Moderate to advanced bilateral facet hypertrophy. Moderate canal and bilateral subarticular stenosis is grossly similar. Mild bilateral L3 foraminal stenosis, unchanged. L4-5: 5 mm anterolisthesis. Associated broad posterior pseudo disc bulge. Severe bilateral facet arthrosis. Resultant severe canal and bilateral subarticular stenosis relatively similar. Moderate to severe bilateral L4 foraminal narrowing, right worse than left with probable L4 impingement, also similar. L5-S1: Diffuse disc bulge with disc desiccation. Disc bulging eccentric to the right. Moderate facet arthrosis bilaterally. Canal with bilateral subarticular stenosis is grossly similar. Severe bilateral L5 foraminal stenosis with probable L5 impingement, left worse than right, also similar. IMPRESSION: 1. No acute abnormality within the lumbar spine. 2. Multilevel degenerative spondylolysis as detailed above, overall relatively similar as compared to most recent MRI from 12/04/2015. 3. Severe canal and bilateral foraminal stenosis at L4-5 with L4 and L5 nerve root impingement. 4. Severe  biforaminal stenosis at L5-S1 with L5 nerve root compression. 5. More mild to moderate canal and subarticular stenosis at L1-2 thru L3-4, not significantly changed. Electronically Signed   By: Rise Mu M.D.   On: 11/05/2016 13:19   Ct Renal Stone Study  Result Date: 11/05/2016 CLINICAL DATA:  Right flank pain over the last 2 days. EXAM: CT ABDOMEN AND PELVIS WITHOUT CONTRAST TECHNIQUE: Multidetector CT imaging of the abdomen and pelvis was performed following the standard protocol without IV contrast. COMPARISON:  None. FINDINGS: Lower chest: Mild atelectasis or scarring at the lung bases left more than right. Hepatobiliary: 12 mm cyst in the left lobe at the dome of the liver. No calcified gallstones. Pancreas: Normal Spleen: Normal Adrenals/Urinary Tract: Adrenal glands are normal. Kidneys are symmetric in size. No evidence of measurable stone. No hydronephrosis or passing stone. No stone in the bladder. Slight hyperdensity of the medullary regions which could represent early mineral deposition. Stomach/Bowel: Large amount of fecal matter in the colon, particularly the right colon. There are 2 calcifications in the right lower quadrant that I think are phleboliths. The appendix is seen separated and is normal. No significant bowel finding. Vascular/Lymphatic: Aortic atherosclerosis. No aneurysm. IVC is normal. No adenopathy. Reproductive: Benign uterine calcifications.  No adnexal mass. Other: No free fluid or air. Musculoskeletal: Considerable lower lumbar degenerative changes including anterolisthesis at L4-5. IMPRESSION: No evidence of urinary tract stone disease.  No hydronephrosis. Mild scarring or atelectasis at the lung bases left more than right. Simple hepatic cyst, unchanged. Normal appearing appendix. Fairly large amount of fecal matter in the colon, particularly the right colon. Does the patient have constipation? Electronically Signed   By: Paulina Fusi M.D.   On: 11/05/2016 12:46     Microbiology: No results found for this or any previous visit (from the past 240  hour(s)).   Labs: Basic Metabolic Panel:  Recent Labs Lab 11/05/16 1156 11/08/16 1513 11/09/16 0515 11/10/16 0540  NA 137 132* 137 137  K 3.1* 3.0* 4.1 3.6  CL 96* 91* 101 100*  CO2 32 GLUCOSE 89 99 130* 170*  BUN CREATININE 0.71 0.59 0.49 0.65  CALCIUM 9.0 8.7* 8.6* 8.6*  MG  --   --  1.9 2.0   Liver Function Tests:  Recent Labs Lab 11/05/16 1156 11/08/16 1513  AST 19 23  ALT 11* 12*  ALKPHOS 53 54  BILITOT 0.9 1.1  PROT 6.8 7.0  ALBUMIN 3.4* 3.2*    Recent Labs Lab 11/05/16 1156 11/08/16 1513  LIPASE 25 19   No results for input(s): AMMONIA in the last 168 hours. CBC:  Recent Labs Lab 11/05/16 1156 11/08/16 1512 11/10/16 0540  WBC 6.1 7.3 6.3  NEUTROABS 3.0  --  5.4  HGB 10.2* 9.9* 10.2*  HCT 31.5* 29.1* 31.1*  MCV 67.0* 66.1* 67.0*  PLT 250 265 337   Cardiac Enzymes: No results for input(s): CKTOTAL, CKMB, CKMBINDEX, TROPONINI in the last 168 hours. BNP: BNP (last 3 results) No results for input(s): BNP in the last 8760 hours.  ProBNP (last 3 results) No results for input(s): PROBNP in the last 8760 hours.  CBG: No results for input(s): GLUCAP in the last 168 hours.     SignedAlbertine Grates MD, PhD  [facility] 11/11/2016, 3:01 PM

## 2016-11-11 NOTE — Progress Notes (Signed)
[facility] c#[phone]-contact person for dme delivery.

## 2016-11-11 NOTE — Progress Notes (Signed)
    Durable Medical Equipment        Start     Ordered   11/11/16 1319  For home use only DME Tub bench  Once    Comments:  Transfer tub bench   11/11/16 1319   11/11/16 0000  DME Hospital bed    Question Answer Comment  Patient has (list medical condition): severe spine degenerative changes affect ambulation   The above medical condition requires: Patient requires the ability to reposition frequently   Head must be elevated greater than: 30 degrees   Bed type Semi-electric      11/11/16 1301   11/10/16 1606  For home use only DME Other see comment  Once    Comments:  Shower chair   11/10/16 1605   11/10/16 1605  For home use only DME Walker rolling  Once    Question:  Patient needs a walker to treat with the following condition  Answer:  Unsteady gait   11/10/16 1605

## 2016-11-11 NOTE — Progress Notes (Signed)
Provided [facility] rep Clydie Braun w/[facility] tel# for concerns w/dme.

## 2016-11-11 NOTE — Progress Notes (Signed)
Per Infection Prevention, contact precautions are not needed for reported bedbugs.

## 2016-11-11 NOTE — Progress Notes (Signed)
Received call from nurse-stating she was unable to reach dtr Marylee Floras only able to leave voicemail.[facility] rep Clydie Braun to confirm dme-hospital bed, & transfer tub bench scheduled time delivery to patient's home:7-11p-she also states that [facility] attempted to reach dtr Blanche earlier but was only able to leave a voicemail to schedule delivery of dme. Nurse has been updated on current status.

## 2016-11-11 NOTE — Progress Notes (Signed)
Attempted to call patients daughter, phone range once and went to voicemail. Left voicemail for patients daughter to return call to notify of delivery.

## 2016-11-11 NOTE — Care Management Note (Signed)
Case Management Note  Patient Details  Name: Theresa Bauer MRN: [id number] Date of Birth: Nov 19, 1929  Subjective/Objective:  [facility] rep Tim aware of already following prior to admission-HHRN/PT/OT/aide/CSW ordered. Hospital bed,transfer tub bench,rw ordered. Hospital bed,transfer tub bench to be delivered to home by [facility] will make arrangements w/dtr once dme in home dtr will call floor so patient can d/c by PTAR.Marland Kitchen RW already delivered to patient's rm-dtr will take home. PTAR forms on shadow chart-Nurse to call PTAR once dme delivered to home.                  Action/Plan:d/c home w/HHC/dme   Expected Discharge Date:                  Expected Discharge Plan:  Home w Home Health Services  In-House Referral:     Discharge planning Services  CM Consult  Post Acute Care Choice:    Choice offered to:  Patient  DME Arranged:  Walker rolling, Tub bench, Hospital bed DME Agency:  [facility]  HH Arranged:  RN, PT, OT, Nurse's Aide, Social Work [employer] Agency:  [facility] [employer]  Status of Service:  Completed, signed off  If discussed at [employer] [facility] [employer], dates discussed:    Additional Comments:  Lanier Clam, RN 11/11/2016, 1:27 PM

## 2016-11-11 NOTE — Progress Notes (Signed)
Tried to reach out patients daughter and call went on voicemail . Left a voicemail  to give [place] a call back whenever bed is delivered.

## 2016-11-11 NOTE — Progress Notes (Signed)
Patients daughter called stating, she had no way of moving patients current bed to allow room for hospital bed at patients home. Waiting on return call to verify delivery of bed from daughter.

## 2016-11-12 MED ORDER — LIP MEDEX EX OINT
TOPICAL_OINTMENT | CUTANEOUS | Status: AC
  Filled 2016-11-12: qty 7

## 2016-11-12 NOTE — Progress Notes (Signed)
Physical Therapy Treatment Patient Details Name: TASHAI CATINO MRN: [id number] DOB: 03/30/1929 Today's Date: 11/12/2016    History of Present Illness 83 yo female adm with back pain, inability to amb; see MRI for details    PT Comments    Pt more cooperative and pleasant.  Assisted OOB to amb a greater distance in hallway.  Amb with and without walker (trial).  Pt ready for D/C to home.    Follow Up Recommendations  Home health PT     Equipment Recommendations  Rolling walker with 5" wheels    Recommendations for Other Services       Precautions / Restrictions Precautions Precautions: Fall Restrictions Weight Bearing Restrictions: No    Mobility  Bed Mobility Overal bed mobility: Modified Independent             General bed mobility comments: increased time   Transfers Overall transfer level: Needs assistance Equipment used: None Transfers: Sit to/from Stand;Stand Pivot Transfers Sit to Stand: Supervision Stand pivot transfers: Supervision       General transfer comment: VC's only for direction          Good safety cognition         Good use of hands to steady self  Ambulation/Gait Ambulation/Gait assistance: Supervision Ambulation Distance (Feet): 145 Feet Assistive device: Rolling walker (2 wheeled);None Gait Pattern/deviations: Step-through pattern Gait velocity: WFL   General Gait Details: VC's only for direction     Good safety cognition   Stairs            Wheelchair Mobility    Modified Rankin (Stroke Patients Only)       Balance                                            Cognition Arousal/Alertness: Awake/alert Behavior During Therapy: WFL for tasks assessed/performed Overall Cognitive Status: Within Functional Limits for tasks assessed                                 General Comments: more cooperative and pleasant      Exercises      General Comments        Pertinent Vitals/Pain Pain  Assessment: Faces Faces Pain Scale: Hurts a little bit Pain Location: back and R hip Pain Descriptors / Indicators: Grimacing;Sore Pain Intervention(s): Monitored during session;Repositioned    Home Living                      Prior Function            PT Goals (current goals can now be found in the care plan section) Progress towards PT goals: Progressing toward goals    Frequency    Min 3X/week      PT Plan Current plan remains appropriate    Co-evaluation              AM-PAC PT "6 Clicks" Daily Activity  Outcome Measure  Difficulty turning over in bed (including adjusting bedclothes, sheets and blankets)?: A Little Difficulty moving from lying on back to sitting on the side of the bed? : A Little Difficulty sitting down on and standing up from a chair with arms (e.g., wheelchair, bedside commode, etc,.)?: A Little Help needed moving to and from a bed to chair (including  a wheelchair)?: A Little Help needed walking in hospital room?: A Little Help needed climbing 3-5 steps with a railing? : A Little 6 Click Score: 18    End of Session Equipment Utilized During Treatment: Gait belt Activity Tolerance: Patient tolerated treatment well Patient left: in chair;with call bell/phone within reach Nurse Communication: Mobility status PT Visit Diagnosis: Difficulty in walking, not elsewhere classified (R26.2);Pain     Time: 1610-9604 PT Time Calculation (min) (ACUTE ONLY): 23 min  Charges:  $Gait Training: 8-22 mins $Therapeutic Activity: 8-22 mins                    G Codes:      Felecia Shelling  PTA [facility]  Acute  Rehab Pager      [phone]

## 2016-11-12 NOTE — Clinical Social Work Placement (Addendum)
Patient will go to room 203P Nurse call report to : [phone]  CLINICAL SOCIAL WORK PLACEMENT  NOTE  Date:  11/12/2016  Patient Details  Name: Theresa Bauer MRN: [id number] Date of Birth: [birth year]  Clinical Social Work is seeking post-discharge placement for this patient at the Skilled  Nursing Facility level of care (*CSW will initial, date and re-position this form in  chart as items are completed):  Yes   Patient/family provided with [facility] Clinical Social Work Department's list of facilities offering this level of care within the geographic area requested by the patient (or if unable, by the patient's family).  Yes   Patient/family informed of their freedom to choose among providers that offer the needed level of care, that participate in Medicare, Medicaid or managed care program needed by the patient, have an available bed and are willing to accept the patient.  Yes   Patient/family informed of [facility]'s ownership interest in [facility] and [facility], as well as of the fact that they are under no obligation to receive care at these facilities.  PASRR submitted to EDS on       PASRR number received on       Existing PASRR number confirmed on       FL2 transmitted to all facilities in geographic area requested by pt/family on       FL2 transmitted to all facilities within larger geographic area on       Patient informed that his/her managed care company has contracts with or will negotiate with certain facilities, including the following:  [employer]     Yes   Patient/family informed of bed offers received.  Patient chooses bed at [facility]     Physician recommends and patient chooses bed at      Patient to be transferred to [facility] on 11/12/16.  Patient to be transferred to facility by PTAR     Patient family notified on 11/12/16 of transfer.  Name of family member notified:  Blanche-informed her to fillout admssion paperwork at 6:00pm      PHYSICIAN       Additional Comment:    _______________________________________________ Clearance Coots, LCSW 11/12/2016, 2:05 PM

## 2016-11-12 NOTE — Progress Notes (Signed)
Received a request to speak with the daughter, Theresa Bauer this am. Theresa Bauer to patients room, she provided me with the cell phone number of her daughter, called her but no answer, left a message to return my call. Discussed with patient that she is d/ced, insurance is no longer paying for her hospital stay, if she continues to stay she will be trespassing. Later received a call back from [place], she now wants to persue SNF, discussed with her payment options and that likely would have to be under her Medicaid plan, they will require her to turn over her check. Discussed with CSW, she will start process. Discussed with Theresa Bauer that we will start the process for SNF but that will not delay d/c, she can go home and wait for SNF placement. Theresa Bauer says she has appointments until 6pm. I told her that we will plan to d/c to home today at 6pm via PTAR, unless a SNF bed is found before then. I let her know this would not be likely but the process could be continued at home with the [facility] CSW. She agreed to plan.

## 2016-11-12 NOTE — Progress Notes (Signed)
CSW spoke with the patient and daughter via phone at bedside. CSW explained Medicare/Medicaid process. The patient is not agreeable to a SNF at this time, she does not want to risk using her medicaid check to pay for her stay at the SNF. Patient and daughter both agreeable for the patient to go home w/ home health services.  Patient daughter plans to meet the patient at home at 6:00pm today. No other needs identified at this time.   Vivi Barrack, Theresia Majors, MSW Clinical Social Worker 5E and Psychiatric Service Line [phone] 11/12/2016  1:27 PM

## 2016-11-12 NOTE — Progress Notes (Signed)
PTAR Transport for 4:00pm. Daughter Blanche informed.  Vivi Barrack, Theresia Majors, MSW Clinical Social Worker 5E and Psychiatric Service Line [phone] 11/12/2016  2:09 PM

## 2016-11-12 NOTE — Progress Notes (Signed)
RN called report to [place] at [facility]. All questions answered.   Paperwork and prescriptions sent with patient.   Patient transported via PTAR to SNF.

## 2016-11-12 NOTE — NC FL2 (Signed)
[place] MEDICAID FL2 LEVEL OF CARE SCREENING TOOL     IDENTIFICATION  Patient Name: Theresa Bauer Birthdate: 10-Jul-1929 Sex: female Admission Date (Current Location): 11/08/2016  [facility] and [place] Number:  Producer, television/film/video and Address:  [facility],  [address] [name] [address]      Provider Number: [id number]  Attending Physician Name and Address:  Penny Pia, MD  Relative Name and Phone Number:       Current Level of Care: Hospital Recommended Level of Care: Skilled Nursing Facility Prior Approval Number:    Date Approved/Denied:   PASRR Number: [id number]  Discharge Plan: SNF    Current Diagnoses: Patient Active Problem List   Diagnosis Date Noted  . Low back pain 11/08/2016  . Osteoporosis, unspecified 09/30/2012  . Branch retinal artery occlusion, left eye 09/30/2012  . HYPERTENSION 04/05/2010  . VENOUS INSUFFICIENCY, CHRONIC 04/05/2010  . GERD 04/05/2010  . CHEST PAIN 04/05/2010    Orientation RESPIRATION BLADDER Height & Weight     Self, Time, Situation, Place  Normal Continent Weight: 145 lb (65.8 kg) Height:  5' (152.4 cm)  BEHAVIORAL SYMPTOMS/MOOD NEUROLOGICAL BOWEL NUTRITION STATUS      Continent Diet (low sodium heart healthy )  AMBULATORY STATUS COMMUNICATION OF NEEDS Skin   Extensive Assist Verbally Normal                       Personal Care Assistance Level of Assistance  Bathing, Dressing, Feeding Bathing Assistance: Limited assistance Feeding assistance: Independent Dressing Assistance: Limited assistance     Functional Limitations Info  Sight, Hearing, Speech Sight Info: Adequate Hearing Info: Adequate Speech Info: Adequate    SPECIAL CARE FACTORS FREQUENCY  PT (By licensed PT), OT (By licensed OT)     PT Frequency: 5X/WEEK OT Frequency: 5X/WEEK            Contractures Contractures Info: Not present    Additional Factors Info  Code Status Code Status Info: Fullcode              Current Medications (11/12/2016):  This is the current hospital active medication list Current Facility-Administered Medications  Medication Dose Route Frequency Provider Last Rate Last Dose  . acetaminophen (TYLENOL) tablet 650 mg  650 mg Oral Q6H PRN Albertine Grates, MD   650 mg at 11/10/16 2112  . amLODipine (NORVASC) tablet 5 mg  5 mg Oral Daily Albertine Grates, MD   5 mg at 11/12/16 0908  . bisacodyl (DULCOLAX) suppository 10 mg  10 mg Rectal Daily Albertine Grates, MD   10 mg at 11/11/16 1031  . cyclobenzaprine (FLEXERIL) tablet 5 mg  5 mg Oral TID PRN Albertine Grates, MD   5 mg at 11/12/16 0908  . gabapentin (NEURONTIN) capsule 300 mg  300 mg Oral QHS Albertine Grates, MD   300 mg at 11/11/16 2156  . latanoprost (XALATAN) 0.005 % ophthalmic solution 1 drop  1 drop Both Eyes QHS Albertine Grates, MD   1 drop at 11/11/16 2158  . lidocaine (LIDODERM) 5 % 1 patch  1 patch Transdermal Q24H Albertine Grates, MD   1 patch at 11/11/16 2157  . lip balm (CARMEX) ointment           . methylPREDNISolone sodium succinate (SOLU-MEDROL) 125 mg/2 mL injection 60 mg  60 mg Intravenous Q24H Albertine Grates, MD   60 mg at 11/11/16 2156  . metoprolol tartrate (LOPRESSOR) tablet 12.5 mg  12.5 mg Oral BID Albertine Grates,  MD   12.5 mg at 11/12/16 0908  . pantoprazole (PROTONIX) EC tablet 40 mg  40 mg Oral Daily Albertine Grates, MD   40 mg at 11/12/16 0909  . senna-docusate (Senokot-S) tablet 1 tablet  1 tablet Oral QHS Albertine Grates, MD   1 tablet at 11/11/16 2156  . tamsulosin (FLOMAX) capsule 0.4 mg  0.4 mg Oral Daily Albertine Grates, MD   0.4 mg at 11/12/16 0908  . timolol (TIMOPTIC) 0.5 % ophthalmic solution 1 drop  1 drop Both Eyes Daily Albertine Grates, MD   1 drop at 11/12/16 0909  . zolpidem (AMBIEN) tablet 5 mg  5 mg Oral QHS PRN Albertine Grates, MD   5 mg at 11/11/16 2322     Discharge Medications: Please see discharge summary for a list of discharge medications.  Relevant Imaging Results:  Relevant Lab Results:   Additional Information ssn:[id number]  Clearance Coots,  LCSW

## 2016-11-12 NOTE — Clinical Social Work Note (Signed)
Clinical Social Work Assessment  Patient Details  Name: Theresa Bauer MRN: [id number] Date of Birth: 1929/05/08  Date of referral:  11/12/16               Reason for consult:  Facility Placement                Permission sought to share information with:  Family Supports Permission granted to share information::     Name::        Agency::     Relationship::  [name]  Contact Information:     Housing/Transportation Living arrangements for the past 2 months:  Single Family Home Source of Information:  Patient Patient Interpreter Needed:  None Criminal Activity/Legal Involvement Pertinent to Current Situation/Hospitalization:  No - Comment as needed Significant Relationships:  Adult Children Lives with:  Self Do you feel safe going back to the place where you live?  Yes Need for family participation in patient care:  No (Coment)  Care giving concerns:  Patient family feels pt. Can benefit from SNF placement prior to going home.   Social Worker assessment / plan:  CSW met patient and daughter at bedside, explain role. Patient and daughter unsure of patient discharge plan. Patient and daughter were first agreeable to go home with Home Health services with hospital bed but later felt patient needed more care and support. Patient family is now agreeable to SNF placement. CSW provided daughter with possible facility choices.Patient daughter agreeable to send daughter to "the facility on [place]." CSW confirmed with Theresa Bauer at [facility] available. CSW completed fl2,and PASSRR. Information faxed via HUB, Nurse given number for report and room number.  Patient will transport by PTAR.   Plan: Assist w/ discharge to SNF-[facility]  Employment status:  Retired Forensic scientist:  Information systems manager, Medicaid In [place] PT Recommendations:  Home with [place] / Referral to community resources:  Theresa Bauer  Patient/Family's Response to care:   Agreeable to SNF and Responding to care.   Patient/Family's Understanding of and Emotional Response to Diagnosis, Current Treatment, and Prognosis: "My daughters really want me to go to SNF, my daughter is Theresa Bauer is willing to send me money if I need it."   Emotional Assessment Appearance:  Appears stated age Attitude/Demeanor/Rapport:    Affect (typically observed):  Accepting Orientation:  Oriented to Self, Oriented to Place, Oriented to  Time, Oriented to Situation Alcohol / Substance use:  Not Applicable Psych involvement (Current and /or in the community):  No (Comment)  Discharge Needs  Concerns to be addressed:  Discharge Planning Concerns, Care Coordination Readmission within the last 30 days:  No Current discharge risk:  None Barriers to Discharge:  Continued Medical Work up   [employer], LCSW 11/12/2016, 3:33 PM

## 2016-11-12 NOTE — Progress Notes (Signed)
Attempted to call daughter ,call went to voicemail and left a message to call back floor 6 east.

## 2016-11-13 NOTE — Progress Notes (Signed)
I spoke to Theresa Bauer's daughter , Val[place] Agent who stated that her mother is not having surgery at this time, "I didn't even know it was scheduled."  "Mother said that she wants to think about it some more and get some strength back."  I called Erie Noe at Dr Ditty's office and informed her.

## 2016-11-14 ENCOUNTER — Encounter (HOSPITAL_COMMUNITY): Payer: Self-pay | Admitting: Certified Registered"

## 2016-11-14 ENCOUNTER — Inpatient Hospital Stay (HOSPITAL_COMMUNITY): Admission: RE | Admit: 2016-11-14 | Payer: Medicare Other | Source: Ambulatory Visit | Admitting: Neurological Surgery

## 2016-11-14 SURGERY — POSTERIOR LUMBAR FUSION 2 LEVEL
Anesthesia: General

## 2017-02-13 ENCOUNTER — Emergency Department (HOSPITAL_COMMUNITY): Payer: Medicare Other

## 2017-02-13 ENCOUNTER — Emergency Department (HOSPITAL_COMMUNITY)
Admission: EM | Admit: 2017-02-13 | Discharge: 2017-02-15 | Disposition: A | Discharge status: Home / Self Care | Payer: Medicare Other | Attending: Emergency Medicine | Admitting: Emergency Medicine

## 2017-02-13 DIAGNOSIS — F29 Unspecified psychosis not due to a substance or known physiological condition: Secondary | ICD-10-CM | POA: Insufficient documentation

## 2017-02-13 DIAGNOSIS — Z0489 Encounter for examination and observation for other specified reasons: Secondary | ICD-10-CM | POA: Diagnosis not present

## 2017-02-13 DIAGNOSIS — R442 Other hallucinations: Secondary | ICD-10-CM | POA: Diagnosis present

## 2017-02-13 DIAGNOSIS — Z79899 Other long term (current) drug therapy: Secondary | ICD-10-CM | POA: Insufficient documentation

## 2017-02-13 DIAGNOSIS — R443 Hallucinations, unspecified: Secondary | ICD-10-CM

## 2017-02-13 DIAGNOSIS — R531 Weakness: Secondary | ICD-10-CM | POA: Insufficient documentation

## 2017-02-13 DIAGNOSIS — F23 Brief psychotic disorder: Secondary | ICD-10-CM | POA: Diagnosis not present

## 2017-02-13 DIAGNOSIS — R441 Visual hallucinations: Secondary | ICD-10-CM

## 2017-02-13 DIAGNOSIS — I10 Essential (primary) hypertension: Secondary | ICD-10-CM | POA: Insufficient documentation

## 2017-02-13 LAB — COMPREHENSIVE METABOLIC PANEL
ALT: 11 U/L — AB (ref 14–54)
AST: 22 U/L (ref 15–41)
Albumin: 4 g/dL (ref 3.5–5.0)
Alkaline Phosphatase: 60 U/L (ref 38–126)
Anion gap: 8 (ref 5–15)
BUN: 8 mg/dL (ref 6–20)
CALCIUM: 9.4 mg/dL (ref 8.9–10.3)
CHLORIDE: 105 mmol/L (ref 101–111)
CO2: 27 mmol/L (ref 22–32)
CREATININE: 0.59 mg/dL (ref 0.44–1.00)
Glucose, Bld: 98 mg/dL (ref 65–99)
Potassium: 3.6 mmol/L (ref 3.5–5.1)
Sodium: 140 mmol/L (ref 135–145)
Total Bilirubin: 1.3 mg/dL — ABNORMAL HIGH (ref 0.3–1.2)
Total Protein: 7.8 g/dL (ref 6.5–8.1)

## 2017-02-13 LAB — ETHANOL

## 2017-02-13 LAB — SALICYLATE LEVEL: Salicylate Lvl: <7 mg/dL (ref 2.8–30.0)

## 2017-02-13 LAB — URINALYSIS, ROUTINE W REFLEX MICROSCOPIC
Bacteria, UA: NONE SEEN
Bilirubin Urine: NEGATIVE
GLUCOSE, UA: NEGATIVE mg/dL
KETONES UR: NEGATIVE mg/dL
LEUKOCYTES UA: NEGATIVE
NITRITE: NEGATIVE
PROTEIN: NEGATIVE mg/dL
Specific Gravity, Urine: 1.002 — ABNORMAL LOW (ref 1.005–1.030)
Squamous Epithelial / LPF: NONE SEEN
pH: 7 (ref 5.0–8.0)

## 2017-02-13 LAB — CBC
HCT: 33.4 % — ABNORMAL LOW (ref 36.0–46.0)
Hemoglobin: 10.9 g/dL — ABNORMAL LOW (ref 12.0–15.0)
MCH: 22.5 pg — ABNORMAL LOW (ref 26.0–34.0)
MCHC: 32.6 g/dL (ref 30.0–36.0)
MCV: 68.9 fL — ABNORMAL LOW (ref 78.0–100.0)
PLATELETS: 270 10*3/uL (ref 150–400)
RBC: 4.85 MIL/uL (ref 3.87–5.11)
RDW: 17.2 % — ABNORMAL HIGH (ref 11.5–15.5)
WBC: 4.9 10*3/uL (ref 4.0–10.5)

## 2017-02-13 LAB — RAPID URINE DRUG SCREEN, HOSP PERFORMED
Amphetamines: NOT DETECTED
BARBITURATES: NOT DETECTED
Benzodiazepines: NOT DETECTED
COCAINE: NOT DETECTED
Opiates: NOT DETECTED
Tetrahydrocannabinol: NOT DETECTED

## 2017-02-13 LAB — ACETAMINOPHEN LEVEL: Acetaminophen (Tylenol), Serum: <10 ug/mL — ABNORMAL LOW (ref 10–30)

## 2017-02-13 MED ORDER — METOPROLOL TARTRATE 25 MG PO TABS
25.0000 mg | ORAL_TABLET | Freq: Once | ORAL | Status: AC
  Administered 2017-02-13: 25 mg via ORAL
  Filled 2017-02-13: qty 1

## 2017-02-13 MED ORDER — ZOLPIDEM TARTRATE 5 MG PO TABS
5.0000 mg | ORAL_TABLET | Freq: Every evening | ORAL | Status: DC | PRN
  Administered 2017-02-13: 5 mg via ORAL
  Filled 2017-02-13: qty 1

## 2017-02-13 MED ORDER — METOPROLOL SUCCINATE ER 25 MG PO TB24
25.0000 mg | ORAL_TABLET | Freq: Every day | ORAL | Status: DC
  Administered 2017-02-14 – 2017-02-15 (×2): 25 mg via ORAL
  Filled 2017-02-13 (×2): qty 1

## 2017-02-13 MED ORDER — ALUM & MAG HYDROXIDE-SIMETH 200-200-20 MG/5ML PO SUSP: 30.0000 mL | Freq: Four times a day (QID) | ORAL | Status: DC | PRN

## 2017-02-13 MED ORDER — ONDANSETRON HCL 4 MG PO TABS
4.0000 mg | ORAL_TABLET | Freq: Three times a day (TID) | ORAL | Status: DC | PRN
  Administered 2017-02-14: 4 mg via ORAL
  Filled 2017-02-13: qty 1

## 2017-02-13 NOTE — ED Provider Notes (Signed)
[facility]-EMERGENCY DEPT Provider Note   CSN: [id number] Arrival date & time: 02/13/17  1837     History   Chief Complaint Chief Complaint  Patient presents with  . Hallucinations  . Weakness  . Medical Clearance    HPI Jaclyn ShaggyDora I Garcon is a 84 y.o. female.  HPI   84 year old female with history of hypertension, GERD, glaucoma brought here via EMS from home for evaluation of hallucination.  Patient lives at home by herself.  Since August of last year she has had trouble with her lower back in which [facility], as well as was staying at rehab facility for PT and OT.  Since discharge home in October, she has had several episodes of visual and auditory hallucination.  She report occasionally she would see a child figure walking around in her house and would disappear into the walls through the door.  It has happened several times most recent was several days prior.  She see a child in the cupboard, and as well as a child's mother walking around the room.  Furthermore, she has been seeing figures coming out from her TV screen.  She is unsure if it is due to the TV being much closer to her bed.  She mentioned these symptoms to her daughter who requested EMS to bring her here for further evaluation.  She does admits to feeling a bit depressed for the past several months due to her back problem as well as a recent loss of family member.  She does admits to drinking a small amount of sweet 1 daily just to help with appetite.  Denies any increased alcohol use.  She denies any drug use.  Aside from her chronic low back pain she has no other complaint.  No report of headache, change in her vision, chest pain, trouble breathing, abdominal pain, dysuria, new focal numbness or weakness.  No psychiatric history in the past.  Past Medical History:  Diagnosis Date  . GERD (gastroesophageal reflux disease)   . Glaucoma   . Hypertension       Patient Active Problem List   Diagnosis Date Noted  . Low back pain 11/08/2016  . Osteoporosis, unspecified 09/30/2012  . Branch retinal artery occlusion, left eye 09/30/2012  . HYPERTENSION 04/05/2010  . VENOUS INSUFFICIENCY, CHRONIC 04/05/2010  . GERD 04/05/2010  . CHEST PAIN 04/05/2010    Past Surgical History:  Procedure Laterality Date  . CATARACT EXTRACTION Left   . GLAUCOMA REPAIR Left   . REPLACEMENT TOTAL KNEE BILATERAL  2007/2008    OB History    Gravida Para Term Preterm AB Living   5 5 5  0 0     SAB TAB Ectopic Multiple Live Births                   Home Medications    Prior to Admission medications   Medication Sig Start Date End Date Taking? Authorizing Provider  acetaminophen (TYLENOL) 325 MG tablet Take 2 tablets (650 mg total) by mouth every 6 (six) hours as needed for mild pain or moderate pain. 04/11/16   Everlene Farrieransie, William, PA-C  alendronate (FOSAMAX) 70 MG tablet Take 70 mg by mouth every 7 (seven) days. Take with a full glass of water on an empty stomach.    [provider]  bisacodyl (DULCOLAX) 10 MG suppository Place 1 suppository (10 mg total) rectally daily as needed for moderate constipation. 11/11/16   Albertine GratesXu, Fang,  MD  celecoxib (CELEBREX) 100 MG capsule Take 1 capsule (100 mg total) by mouth 2 (two) times daily. 08/21/16   Mardella Layman, MD  cyclobenzaprine (FLEXERIL) 5 MG tablet Take 1 tablet (5 mg total) by mouth 3 (three) times daily as needed. 11/05/16   Charlynne Pander, MD  gabapentin (NEURONTIN) 300 MG capsule Take 1 capsule (300 mg total) by mouth 2 (two) times daily. 11/11/16   Albertine Grates, MD  lidocaine (LIDODERM) 5 % Place 1 patch onto the skin daily. Remove & Discard patch within 12 hours or as directed by MD 11/05/16   Charlynne Pander, MD  loratadine (CLARITIN) 10 MG tablet Take 10 mg by mouth daily.    [provider]  magnesium oxide (MAG-OX) 400 MG tablet Take 1 tablet (400 mg total) by mouth daily. 11/11/16   Albertine Grates,  MD  metoprolol tartrate (LOPRESSOR) 25 MG tablet Take 0.5 tablets (12.5 mg total) by mouth 2 (two) times daily. 11/11/16   Albertine Grates, MD  Multiple Vitamins-Minerals (MULTIVITAMINS THER. W/MINERALS) TABS tablet Take 1 tablet by mouth daily.    [provider]  pantoprazole (PROTONIX) 40 MG tablet Take 1 tablet (40 mg total) by mouth daily. 10/02/12   Laveda Norman, MD  predniSONE (DELTASONE) 10 MG tablet Label  & dispense according to the schedule below. 6 Pills PO on day one then, 5 Pills PO on day two, 4 Pills PO on day three, 3Pills PO on day four, 2 Pills PO on day five, 1 Pills PO on day six,  then STOP.  Total of 21 tabs 11/11/16   Albertine Grates, MD  senna-docusate (SENOKOT-S) 8.6-50 MG tablet Take 1 tablet by mouth at bedtime. 11/11/16   Albertine Grates, MD  timolol (TIMOPTIC) 0.5 % ophthalmic solution Place 1 drop into both eyes daily. 05/01/16 05/01/17  [provider]  traMADol (ULTRAM) 50 MG tablet Take 1 tablet (50 mg total) by mouth every 6 (six) hours as needed. 11/05/16   Charlynne Pander, MD  Travoprost, BAK Free, (TRAVATAN) 0.004 % SOLN ophthalmic solution Place 1 drop into both eyes at bedtime.    [provider]    Family History Family History  Problem Relation Age of Onset  . Heart attack Mother   . Heart attack Father   . Osteoarthritis Father     Social History Social History   Tobacco Use  . Smoking status: Never Smoker  . Smokeless tobacco: Never Used  Substance Use Topics  . Alcohol use: No    Alcohol/week: 0.0 oz  . Drug use: No     Allergies   Lisinopril; Darvocet [propoxyphene n-acetaminophen]; Aspirin; Brimonidine; Penicillins; and Darvon [propoxyphene]   Review of Systems Review of Systems  All other systems reviewed and are negative.    Physical Exam Updated Vital Signs BP (!) 197/75 (BP Location: Left Arm)   Pulse 81   Temp 97.6 F (36.4 C) (Oral)   Resp 17   Ht 5\' 4"  (1.626 m)   Wt 68 kg (150 lb)   SpO2 97%   BMI 25.75  kg/m   Physical Exam  Constitutional: She is oriented to person, place, and time. She appears well-developed and well-nourished. No distress.  HENT:  Head: Atraumatic.  Eyes: Conjunctivae are normal.  Neck: Neck supple.  Cardiovascular: Normal rate and regular rhythm.  Pulmonary/Chest: Effort normal and breath sounds normal.  Abdominal: Soft. Bowel sounds are normal. She exhibits no distension. There is no tenderness.  Musculoskeletal: She exhibits  tenderness (Mild left lumbar paraspinal muscle tenderness on palpation without any overlying skin changes.  No significant midline spine tenderness.).  Neurological: She is alert and oriented to person, place, and time. She has normal strength. No cranial nerve deficit or sensory deficit. She displays a negative Romberg sign. GCS eye subscore is 4. GCS verbal subscore is 5. GCS motor subscore is 6.  Skin: No rash noted.  Psychiatric: She has a normal mood and affect. Her speech is normal and behavior is normal. Judgment and thought content normal. Cognition and memory are normal. She expresses no homicidal and no suicidal ideation.  Nursing note and vitals reviewed.    ED Treatments / Results  Labs (all labs ordered are listed, but only abnormal results are displayed) Labs Reviewed  CBC - Abnormal; Notable for the following components:      Result Value   Hemoglobin 10.9 (*)    HCT 33.4 (*)    MCV 68.9 (*)    MCH 22.5 (*)    RDW 17.2 (*)    All other components within normal limits  URINALYSIS, ROUTINE W REFLEX MICROSCOPIC - Abnormal; Notable for the following components:   Color, Urine STRAW (*)    Specific Gravity, Urine 1.002 (*)    Hgb urine dipstick SMALL (*)    All other components within normal limits  COMPREHENSIVE METABOLIC PANEL - Abnormal; Notable for the following components:   ALT 11 (*)    Total Bilirubin 1.3 (*)    All other components within normal limits  ACETAMINOPHEN LEVEL - Abnormal; Notable for the following  components:   Acetaminophen (Tylenol), Serum <10 (*)    All other components within normal limits  ETHANOL  SALICYLATE LEVEL  RAPID URINE DRUG SCREEN, HOSP PERFORMED    EKG  EKG Interpretation  Date/Time:  Friday February 13 2017 19:15:37 EST Ventricular Rate:  79 PR Interval:  184 QRS Duration: 76 QT Interval:  390 QTC Calculation: 447 R Axis:   -13 Text Interpretation:  Normal sinus rhythm Possible Left atrial enlargement Left ventricular hypertrophy Abnormal ECG No acute changes Nonspecific ST and T wave abnormality Confirmed by Derwood Kaplan ([id number]) on 02/13/2017 8:33:49 PM       Radiology Ct Head Wo Contrast  Result Date: 02/13/2017 CLINICAL DATA:  84 year old female with altered mental status. EXAM: CT HEAD WITHOUT CONTRAST TECHNIQUE: Contiguous axial images were obtained from the base of the skull through the vertex without intravenous contrast. COMPARISON:  Head CT dated 04/11/2016 FINDINGS: Brain: There is mild age-related atrophy and chronic microvascular ischemic changes. There is no acute intracranial hemorrhage. No mass effect or midline shift. No extra-axial fluid collection. Vascular: No hyperdense vessel or unexpected calcification. Skull: Normal. Negative for fracture or focal lesion. Sinuses/Orbits: There is opacification of the right maxillary sinus with sclerotic changes and remodeling of the walls of the right maxillary sinus consistent with chronic sinusitis. The remainder of the visualized paranasal sinuses and mastoid air cells are clear. Other: None IMPRESSION: 1. No acute intracranial hemorrhage. 2. Age-related atrophy and chronic microvascular ischemic changes. 3. Chronic right maxillary sinus disease. Electronically Signed   By: Elgie Collard M.D.   On: 02/13/2017 21:20    Procedures Procedures (including critical care time)  Medications Ordered in ED Medications  zolpidem (AMBIEN) tablet 5 mg (5 mg Oral Given 02/13/17 2251)  ondansetron (ZOFRAN)  tablet 4 mg (not administered)  alum & mag hydroxide-simeth (MAALOX/MYLANTA) 200-200-20 MG/5ML suspension 30 mL (not administered)  metoprolol succinate (TOPROL-XL) 24 hr tablet  25 mg (not administered)  metoprolol tartrate (LOPRESSOR) tablet 25 mg (25 mg Oral Given 02/13/17 2141)     Initial Impression / Assessment and Plan / ED Course  I have reviewed the triage vital signs and the nursing notes.  Pertinent labs & imaging results that were available during my care of the patient were reviewed by me and considered in my medical decision making (see chart for details).     BP (!) 160/72 (BP Location: Left Arm)   Pulse 75   Temp 98.8 F (37.1 C) (Oral)   Resp 20   Ht 5\' 4"  (1.626 m)   Wt 68 kg (150 lb)   SpO2 99%   BMI 25.75 kg/m    Final Clinical Impressions(s) / ED Diagnoses   Final diagnoses:  Hallucinations    ED Discharge Orders    None     8:06 PM Patient here with auditory and visual hallucination for the past several months, seen human figures walking in and out of his bedroom, and going through walls.  She denies any acute pain and does not have any focal neuro deficit on exam.  She does report feeling a bit depressed which can potentially cause her symptoms.  Will perform screening labs including head CT scan, basic labs, check UA to assess for potential medical cause of her symptoms.  9:59 PM No medical condition identified.  Due to depression, having auditory and visual hallucination, patient will likely benefit from a psychiatric assessment.  Care discussed with Dr. Rhunette Croft.    Patient is found to be hypertensive, she does not take her blood pressure medication.  Will provide a dose of her blood pressure medication and will recheck.  11:33 PM Blood pressure improves.  Pt is medically cleared.  TTS will evaluate pt and will help with disposition.     Fayrene Helper, PA-C 02/13/17 1610    Derwood Kaplan, MD 02/14/17 1600

## 2017-02-13 NOTE — [facility] Counselor (Signed)
Clinician received verbal consent from the pt to speak to her daughter Ashok Croon(Blanche Somera, [phone]).  Redmond Pullingreylese D Daylene Vandenbosch, MS, [facility], [facility] Triage Specialist [phone]
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
## 2017-02-13 NOTE — ED Notes (Signed)
Family at bedside. 

## 2017-02-13 NOTE — ED Notes (Signed)
Provider at bedside. EDPA AT BEDSIDE

## 2017-02-13 NOTE — ED Triage Notes (Signed)
Per [facility]- Pt resides at home. Pt report visual and auditory hallucinations started last night. No HX. Pt states hallucinations- someone smoking on back porch and child walking through living room.  Pt denies UTI. NEG STROKE. No new medications. Pt ambulatory to stretcher. Pt does have cane with her.

## 2017-02-13 NOTE — ED Notes (Signed)
Bed: WA28 Expected date:  Expected time:  Means of arrival:  Comments: EMS-hallucinations 

## 2017-02-14 ENCOUNTER — Other Ambulatory Visit: Payer: Self-pay

## 2017-02-14 DIAGNOSIS — F23 Brief psychotic disorder: Secondary | ICD-10-CM | POA: Diagnosis present

## 2017-02-14 DIAGNOSIS — F29 Unspecified psychosis not due to a substance or known physiological condition: Secondary | ICD-10-CM | POA: Diagnosis not present

## 2017-02-14 DIAGNOSIS — R441 Visual hallucinations: Secondary | ICD-10-CM | POA: Diagnosis not present

## 2017-02-14 MED ORDER — RISPERIDONE 0.5 MG PO TABS
0.5000 mg | ORAL_TABLET | Freq: Two times a day (BID) | ORAL | Status: DC
  Administered 2017-02-14 – 2017-02-15 (×3): 0.5 mg via ORAL
  Filled 2017-02-14 (×3): qty 1

## 2017-02-14 NOTE — Progress Notes (Signed)
Per Thedore MinsMojeed Akintayo, MD, this pt requires psychiatric hospitalization at this time.  CSW faxed information to:  [place]  [facility]    Stacy GardnerErin Heru Montz, [place] Clinical Social Worker [phone]

## 2017-02-14 NOTE — ED Notes (Deleted)
\  [id number]\ 

## 2017-02-14 NOTE — Consult Note (Signed)
[facility] Consult   Reason for Consult:  Concern for recent hallucinations Referring Physician:  Emergency Room Provider Patient Identification: Theresa Bauer MRN:  [id number] Principal Diagnosis: Acute psychosis [facility]) Diagnosis:   Patient Active Problem List   Diagnosis Date Noted  . Hallucination, visual [R44.1] 02/14/2017  . Acute psychosis ([place]) [F23] 02/14/2017  . Low back pain [M54.5] 11/08/2016  . Osteoporosis, unspecified [M81.0] 09/30/2012  . Branch retinal artery occlusion, left eye [H34.232] 09/30/2012  . HYPERTENSION [I10] 04/05/2010  . VENOUS INSUFFICIENCY, CHRONIC [I87.2] 04/05/2010  . GERD [K21.9] 04/05/2010  . CHEST PAIN [R07.9] 04/05/2010    Total Time spent with patient: 45 minutes  HPI:   Patient is a 84 y.o. female patient admitted to the ER following family concerns about patient seeing hallucinations. Since discharge home from a recent surgery in October, she has had several episodes of visual and auditory hallucinations.  She reports occasionally she would see a child figure walking around in her house and would disappear into the walls through the door.  This has happened several times that she has seen a child in the cupboard, and as well as this child's mother walking around in her rooms. During the visit today she states that she has been seeing figures coming out from her TV screen.  She is unsure if it is due to the TV being much closer to her bed.  Theresa Bauer has also been wandering around her apartment complex and is becoming a safety issue.  She mentioned these symptoms to her daughter who requested EMS to bring her here for further evaluation.  She does admits to feeling a bit depressed for the past several months due to her back problem as well as a recent loss of family member. She denies suicidal ideation, thoughts of hurting herself and feels safe at home.  Past Psychiatric History:  Family Denies  Risk to Self: Suicidal Ideation: No(Pt  denies.) Suicidal Intent: No Is patient at risk for suicide?: No Suicidal Plan?: No Access to Means: No What has been your use of drugs/alcohol within the last 12 months?: Pt's UDS is negative.  How many times?: 0 Other Self Harm Risks: Pt denies.  Triggers for Past Attempts: None known Intentional Self Injurious Behavior: None(Pt denies. ) Risk to Others: Homicidal Ideation: No(Pt denies.) Thoughts of Harm to Others: No Current Homicidal Intent: No Current Homicidal Plan: No Access to Homicidal Means: No Identified Victim: NA History of harm to others?: No Assessment of Violence: None Noted Violent Behavior Description: NA Does patient have access to weapons?: No(Knives. ) Criminal Charges Pending?: No Does patient have a court date: No Prior Inpatient Therapy: Prior Inpatient Therapy: No Prior Therapy Dates: NA Prior Therapy Facilty/Provider(s): NA Reason for Treatment: NA Prior Outpatient Therapy: Prior Outpatient Therapy: No Prior Therapy Dates: NA Prior Therapy Facilty/Provider(s): NA Reason for Treatment: NA Does patient have an ACCT team?: No Does patient have Intensive In-House Services?  : No Does patient have [facility] services? : No Does patient have P4[facility] services?: No  Past Medical History:  Past Medical History:  Diagnosis Date  . GERD (gastroesophageal reflux disease)   . Glaucoma   . Hypertension     Past Surgical History:  Procedure Laterality Date  . CATARACT EXTRACTION Left   . GLAUCOMA REPAIR Left   . REPLACEMENT TOTAL KNEE BILATERAL  2007/2008   Family History:  Family History  Problem Relation Age of Onset  . Heart attack Mother   . Heart attack  Father   . Osteoarthritis Father    Family Psychiatric  History: none Social History:  Social History   Substance and Sexual Activity  Alcohol Use No  . Alcohol/week: 0.0 oz     Social History   Substance and Sexual Activity  Drug Use No    Social History   Socioeconomic History  .  Marital status: Widowed    Spouse name: Not on file  . Number of children: Not on file  . Years of education: Not on file  . Highest education level: Not on file  Social Needs  . Financial resource strain: Not on file  . Food insecurity - worry: Not on file  . Food insecurity - inability: Not on file  . Transportation needs - medical: Not on file  . Transportation needs - non-medical: Not on file  Occupational History  . Occupation: Retired  Tobacco Use  . Smoking status: Never Smoker  . Smokeless tobacco: Never Used  Substance and Sexual Activity  . Alcohol use: No    Alcohol/week: 0.0 oz  . Drug use: No  . Sexual activity: Not Currently  Other Topics Concern  . Not on file  Social History Narrative   Lives alone in apartment. Cares for self. Rides the bus. Dtr helps.   Additional Social History:    Allergies:   Allergies  Allergen Reactions  . Lisinopril Anaphylaxis  . Darvocet [Propoxyphene N-Acetaminophen] Itching  . Aspirin     itching  . Brimonidine Itching  . Penicillins     Itching Has patient had a PCN reaction causing immediate rash, facial/tongue/throat swelling, SOB or lightheadedness with hypotension: no Has patient had a PCN reaction causing severe rash involving mucus membranes or skin necrosis: no Has patient had a PCN reaction that required hospitalization: no Has patient had a PCN reaction occurring within the last 10 years: no If all of the above answers are "NO", then may proceed with Cephalosporin use.   . Darvon [Propoxyphene] Itching    Labs:  Results for orders placed or performed during the hospital encounter of 02/13/17 (from the past 48 hour(s))  Urinalysis, Routine w reflex microscopic     Status: Abnormal   Collection Time: 02/13/17  7:19 PM  Result Value Ref Range   Color, Urine STRAW (A) YELLOW   APPearance CLEAR CLEAR   Specific Gravity, Urine 1.002 (L) 1.005 - 1.030   pH 7.0 5.0 - 8.0   Glucose, UA NEGATIVE NEGATIVE mg/dL    Hgb urine dipstick SMALL (A) NEGATIVE   Bilirubin Urine NEGATIVE NEGATIVE   Ketones, ur NEGATIVE NEGATIVE mg/dL   Protein, ur NEGATIVE NEGATIVE mg/dL   Nitrite NEGATIVE NEGATIVE   Leukocytes, UA NEGATIVE NEGATIVE   RBC / HPF 0-5 0 - 5 RBC/hpf   WBC, UA 0-5 0 - 5 WBC/hpf   Bacteria, UA NONE SEEN NONE SEEN   Squamous Epithelial / LPF NONE SEEN NONE SEEN  Rapid urine drug screen (hospital performed)     Status: None   Collection Time: 02/13/17  7:19 PM  Result Value Ref Range   Opiates NONE DETECTED NONE DETECTED   Cocaine NONE DETECTED NONE DETECTED   Benzodiazepines NONE DETECTED NONE DETECTED   Amphetamines NONE DETECTED NONE DETECTED   Tetrahydrocannabinol NONE DETECTED NONE DETECTED   Barbiturates NONE DETECTED NONE DETECTED    Comment: (NOTE) DRUG SCREEN FOR MEDICAL PURPOSES ONLY.  IF CONFIRMATION IS NEEDED FOR ANY PURPOSE, NOTIFY LAB WITHIN 5 DAYS. LOWEST DETECTABLE LIMITS FOR URINE DRUG SCREEN  Drug Class                     Cutoff (ng/mL) Amphetamine and metabolites    1000 Barbiturate and metabolites    200 Benzodiazepine                 573 Tricyclics and metabolites     300 Opiates and metabolites        300 Cocaine and metabolites        300 THC                            50   CBC     Status: Abnormal   Collection Time: 02/13/17  8:30 PM  Result Value Ref Range   WBC 4.9 4.0 - 10.5 K/uL   RBC 4.85 3.87 - 5.11 MIL/uL   Hemoglobin 10.9 (L) 12.0 - 15.0 g/dL   HCT 33.4 (L) 36.0 - 46.0 %   MCV 68.9 (L) 78.0 - 100.0 fL   MCH 22.5 (L) 26.0 - 34.0 pg   MCHC 32.6 30.0 - 36.0 g/dL   RDW 17.2 (H) 11.5 - 15.5 %   Platelets 270 150 - 400 K/uL  Comprehensive metabolic panel     Status: Abnormal   Collection Time: 02/13/17  8:30 PM  Result Value Ref Range   Sodium 140 135 - 145 mmol/L   Potassium 3.6 3.5 - 5.1 mmol/L   Chloride 105 101 - 111 mmol/L   CO2 27 22 - 32 mmol/L   Glucose, Bld 98 65 - 99 mg/dL   BUN 8 6 - 20 mg/dL   Creatinine, Ser 0.59 0.44 - 1.00 mg/dL    Calcium 9.4 8.9 - 10.3 mg/dL   Total Protein 7.8 6.5 - 8.1 g/dL   Albumin 4.0 3.5 - 5.0 g/dL   AST 22 15 - 41 U/L   ALT 11 (L) 14 - 54 U/L   Alkaline Phosphatase 60 38 - 126 U/L   Total Bilirubin 1.3 (H) 0.3 - 1.2 mg/dL   GFR calc non Af Amer >60 >60 mL/min   GFR calc Af Amer >60 >60 mL/min    Comment: (NOTE) The eGFR has been calculated using the CKD EPI equation. This calculation has not been validated in all clinical situations. eGFR's persistently <60 mL/min signify possible Chronic Kidney Disease.    Anion gap 8 5 - 15  Ethanol     Status: None   Collection Time: 02/13/17  8:30 PM  Result Value Ref Range   Alcohol, Ethyl (B) <10 <10 mg/dL    Comment:        LOWEST DETECTABLE LIMIT FOR SERUM ALCOHOL IS 10 mg/dL FOR MEDICAL PURPOSES ONLY   Salicylate level     Status: None   Collection Time: 02/13/17  8:30 PM  Result Value Ref Range   Salicylate Lvl <2.2 2.8 - 30.0 mg/dL  Acetaminophen level     Status: Abnormal   Collection Time: 02/13/17  8:30 PM  Result Value Ref Range   Acetaminophen (Tylenol), Serum <10 (L) 10 - 30 ug/mL    Comment:        THERAPEUTIC CONCENTRATIONS VARY SIGNIFICANTLY. A RANGE OF 10-30 ug/mL MAY BE AN EFFECTIVE CONCENTRATION FOR MANY PATIENTS. HOWEVER, SOME ARE BEST TREATED AT CONCENTRATIONS OUTSIDE THIS RANGE. ACETAMINOPHEN CONCENTRATIONS >150 ug/mL AT 4 HOURS AFTER INGESTION AND >50 ug/mL AT 12 HOURS AFTER INGESTION ARE OFTEN ASSOCIATED WITH TOXIC REACTIONS.  Current Facility-Administered Medications  Medication Dose Route Frequency Provider Last Rate Last Dose  . alum & mag hydroxide-simeth (MAALOX/MYLANTA) 200-200-20 MG/5ML suspension 30 mL  30 mL Oral Q6H PRN Domenic Moras, PA-C      . metoprolol succinate (TOPROL-XL) 24 hr tablet 25 mg  25 mg Oral Daily Domenic Moras, PA-C   25 mg at 02/14/17 0954  . ondansetron (ZOFRAN) tablet 4 mg  4 mg Oral Q8H PRN Domenic Moras, PA-C   4 mg at 02/14/17 0225   Current Outpatient Medications   Medication Sig Dispense Refill  . acetaminophen (TYLENOL) 325 MG tablet Take 2 tablets (650 mg total) by mouth every 6 (six) hours as needed for mild pain or moderate pain. 60 tablet 0  . alendronate (FOSAMAX) 70 MG tablet Take 70 mg by mouth every 7 (seven) days. Take with a full glass of water on an empty stomach on Saturday    . bisacodyl (DULCOLAX) 5 MG EC tablet Take 5 mg by mouth daily as needed for mild constipation or moderate constipation.    . cyclobenzaprine (FLEXERIL) 5 MG tablet Take 1 tablet (5 mg total) by mouth 3 (three) times daily as needed. 15 tablet 0  . gabapentin (NEURONTIN) 300 MG capsule Take 1 capsule (300 mg total) by mouth 2 (two) times daily. 60 capsule 0  . loratadine (CLARITIN) 10 MG tablet Take 10 mg by mouth daily.    . metoprolol succinate (TOPROL-XL) 25 MG 24 hr tablet Take 25 mg by mouth daily.  2  . Multiple Vitamins-Minerals (MULTIVITAMINS THER. W/MINERALS) TABS tablet Take 1 tablet by mouth daily.    . pantoprazole (PROTONIX) 40 MG tablet Take 1 tablet (40 mg total) by mouth daily. 30 tablet 0  . senna-docusate (SENOKOT-S) 8.6-50 MG tablet Take 1 tablet by mouth at bedtime. 30 tablet 0  . timolol (TIMOPTIC) 0.5 % ophthalmic solution Place 1 drop into both eyes daily.    . traMADol (ULTRAM) 50 MG tablet Take 1 tablet (50 mg total) by mouth every 6 (six) hours as needed. 15 tablet 0  . Travoprost, BAK Free, (TRAVATAN) 0.004 % SOLN ophthalmic solution Place 1 drop into both eyes at bedtime.    . bisacodyl (DULCOLAX) 10 MG suppository Place 1 suppository (10 mg total) rectally daily as needed for moderate constipation. (Patient not taking: Reported on 02/13/2017) 12 suppository 0  . celecoxib (CELEBREX) 100 MG capsule Take 1 capsule (100 mg total) by mouth 2 (two) times daily. (Patient not taking: Reported on 02/13/2017) 14 capsule 0  . lidocaine (LIDODERM) 5 % Place 1 patch onto the skin daily. Remove & Discard patch within 12 hours or as directed by MD (Patient not  taking: Reported on 02/13/2017) 20 patch 0  . magnesium oxide (MAG-OX) 400 MG tablet Take 1 tablet (400 mg total) by mouth daily. (Patient not taking: Reported on 02/13/2017) 30 tablet 0  . metoprolol tartrate (LOPRESSOR) 25 MG tablet Take 0.5 tablets (12.5 mg total) by mouth 2 (two) times daily. (Patient not taking: Reported on 02/13/2017) 60 tablet 0  . predniSONE (DELTASONE) 10 MG tablet Label  & dispense according to the schedule below. 6 Pills PO on day one then, 5 Pills PO on day two, 4 Pills PO on day three, 3Pills PO on day four, 2 Pills PO on day five, 1 Pills PO on day six,  then STOP.  Total of 21 tabs (Patient not taking: Reported on 02/13/2017) 21 tablet 0    Musculoskeletal: Strength & Muscle Tone:  within normal limits Gait & Station: normal Patient leans: N/A  Psychiatric Specialty Exam: Physical Exam  Constitutional: She appears well-developed and well-nourished.  HENT:  Head: Normocephalic.  Neck: Normal range of motion.  Respiratory: Effort normal.  Musculoskeletal: Normal range of motion.  Neurological: She is alert.  Psychiatric: She has a normal mood and affect. Her speech is normal. Judgment normal. She is actively hallucinating. Thought content is delusional. Cognition and memory are impaired.    Review of Systems  Psychiatric/Behavioral: Positive for hallucinations.  All other systems reviewed and are negative.   Blood pressure (!) 126/48, pulse 78, temperature 98.5 F (36.9 C), temperature source Oral, resp. rate 20, height 5' 4"  (1.626 m), weight 68 kg (150 lb), SpO2 99 %.Body mass index is 25.75 kg/m.  General Appearance: Casual  Eye Contact:  Minimal  Speech:  Clear and Coherent  Volume:  Normal  Mood:  Euthymic  Affect:  Flat  Thought Process:  Disorganized  Orientation:  Full (Time, Place, and Person)  Thought Content:  Hallucinations: Visual  Suicidal Thoughts:  No  Homicidal Thoughts:  No  Memory:  Immediate;   Fair Recent;   Fair Remote;   Good   Judgement:  Impaired  Insight:  Lacking  Psychomotor Activity:  Normal  Concentration:  Concentration: Good and Attention Span: Good  Recall:  [place] of Knowledge:  Good  Language:  Good  Akathisia:  No    AIMS (if indicated):     Assets:  Communication Skills Desire for Improvement Financial Resources/Insurance Housing Resilience Social Support  ADL's:  Intact  Cognition:  Impaired,  Mild  Sleep:        Treatment Plan Summary: Daily contact with patient to assess and evaluate symptoms and progress in treatment, Medication management and Plan acute psychosis:  -Crisis stabilization -Medication management:  Medical medications restarted and Risperdal 0.5 mg BID for hallucinations -Individual counseling  Disposition: Recommend psychiatric Inpatient admission when medically cleared.  Waylan Boga, NP 02/14/2017 11:55 AM  Patient seen face-to-face for psychiatric evaluation, chart reviewed and case discussed with the physician extender and developed treatment plan. Reviewed the information documented and agree with the treatment plan. Corena Pilgrim, MD

## 2017-02-15 DIAGNOSIS — F23 Brief psychotic disorder: Secondary | ICD-10-CM | POA: Diagnosis not present

## 2017-02-15 DIAGNOSIS — F29 Unspecified psychosis not due to a substance or known physiological condition: Secondary | ICD-10-CM | POA: Diagnosis not present

## 2017-02-15 DIAGNOSIS — R441 Visual hallucinations: Secondary | ICD-10-CM | POA: Diagnosis not present

## 2017-02-15 MED ORDER — RISPERIDONE 0.5 MG PO TABS: 0.5000 mg | ORAL_TABLET | Freq: Two times a day (BID) | ORAL | 0 refills | Status: DC

## 2017-02-15 NOTE — Consult Note (Signed)
[place] Psychiatry Consult   Reason for Consult:  Concern for recent hallucinations Referring Physician:  Emergency Room Provider Patient Identification: Theresa Bauer MRN:  [id number] Principal Diagnosis: Acute psychosis [facility]) Diagnosis:   Patient Active Problem List   Diagnosis Date Noted  . Acute psychosis ([place]) [F23] 02/14/2017    Priority: High  . Hallucination, visual [R44.1] 02/14/2017  . Low back pain [M54.5] 11/08/2016  . Osteoporosis, unspecified [M81.0] 09/30/2012  . Branch retinal artery occlusion, left eye [H34.232] 09/30/2012  . HYPERTENSION [I10] 04/05/2010  . VENOUS INSUFFICIENCY, CHRONIC [I87.2] 04/05/2010  . GERD [K21.9] 04/05/2010  . CHEST PAIN [R07.9] 04/05/2010    Total Time spent with patient: 30 minutes   HPI:   Patient is a 84 y.o. female patient admitted to the ER following family concerns about patient seeing hallucinations. Medications were restarted and she stabilized.  She does not want to go to a facility, prefers to go home and follow-up with outpatient.  No suicidal/homicidal ideations, hallucinations, and substance abuse.  Stable to return, family may need to consider assistance with her at home, social worker involved.  Past Psychiatric History:  NOne  Risk to Self: Suicidal Ideation: No(Pt denies.) Suicidal Intent: No Is patient at risk for suicide?: No Suicidal Plan?: No Access to Means: No What has been your use of drugs/alcohol within the last 12 months?: Pt's UDS is negative.  How many times?: 0 Other Self Harm Risks: Pt denies.  Triggers for Past Attempts: None known Intentional Self Injurious Behavior: None(Pt denies. ) Risk to Others: Homicidal Ideation: No(Pt denies.) Thoughts of Harm to Others: No Current Homicidal Intent: No Current Homicidal Plan: No Access to Homicidal Means: No Identified Victim: NA History of harm to others?: No Assessment of Violence: None Noted Violent Behavior Description: NA Does patient have  access to weapons?: No(Knives. ) Criminal Charges Pending?: No Does patient have a court date: No Prior Inpatient Therapy: Prior Inpatient Therapy: No Prior Therapy Dates: NA Prior Therapy Facilty/Provider(s): NA Reason for Treatment: NA Prior Outpatient Therapy: Prior Outpatient Therapy: No Prior Therapy Dates: NA Prior Therapy Facilty/Provider(s): NA Reason for Treatment: NA Does patient have an ACCT team?: No Does patient have Intensive In-House Services?  : No Does patient have [facility] services? : No Does patient have P4CC services?: No  Past Medical History:  Past Medical History:  Diagnosis Date  . GERD (gastroesophageal reflux disease)   . Glaucoma   . Hypertension     Past Surgical History:  Procedure Laterality Date  . CATARACT EXTRACTION Left   . GLAUCOMA REPAIR Left   . REPLACEMENT TOTAL KNEE BILATERAL  2007/2008   Family History:  Family History  Problem Relation Age of Onset  . Heart attack Mother   . Heart attack Father   . Osteoarthritis Father    Family Psychiatric  History: none Social History:  Social History   Substance and Sexual Activity  Alcohol Use No  . Alcohol/week: 0.0 oz     Social History   Substance and Sexual Activity  Drug Use No    Social History   Socioeconomic History  . Marital status: Widowed    Spouse name: Not on file  . Number of children: Not on file  . Years of education: Not on file  . Highest education level: Not on file  Social Needs  . Financial resource strain: Not on file  . Food insecurity - worry: Not on file  . Food insecurity - inability: Not on file  .  Transportation needs - medical: Not on file  . Transportation needs - non-medical: Not on file  Occupational History  . Occupation: Retired  Tobacco Use  . Smoking status: Never Smoker  . Smokeless tobacco: Never Used  Substance and Sexual Activity  . Alcohol use: No    Alcohol/week: 0.0 oz  . Drug use: No  . Sexual activity: Not Currently   Other Topics Concern  . Not on file  Social History Narrative   Lives alone in apartment. Cares for self. Rides the bus. Dtr helps.   Additional Social History:    Allergies:   Allergies  Allergen Reactions  . Lisinopril Anaphylaxis  . Darvocet [Propoxyphene N-Acetaminophen] Itching  . Aspirin     itching  . Brimonidine Itching  . Penicillins     Itching Has patient had a PCN reaction causing immediate rash, facial/tongue/throat swelling, SOB or lightheadedness with hypotension: no Has patient had a PCN reaction causing severe rash involving mucus membranes or skin necrosis: no Has patient had a PCN reaction that required hospitalization: no Has patient had a PCN reaction occurring within the last 10 years: no If all of the above answers are "NO", then may proceed with Cephalosporin use.   . Darvon [Propoxyphene] Itching    Labs:  Results for orders placed or performed during the hospital encounter of 02/13/17 (from the past 48 hour(s))  Urinalysis, Routine w reflex microscopic     Status: Abnormal   Collection Time: 02/13/17  7:19 PM  Result Value Ref Range   Color, Urine STRAW (A) YELLOW   APPearance CLEAR CLEAR   Specific Gravity, Urine 1.002 (L) 1.005 - 1.030   pH 7.0 5.0 - 8.0   Glucose, UA NEGATIVE NEGATIVE mg/dL   Hgb urine dipstick SMALL (A) NEGATIVE   Bilirubin Urine NEGATIVE NEGATIVE   Ketones, ur NEGATIVE NEGATIVE mg/dL   Protein, ur NEGATIVE NEGATIVE mg/dL   Nitrite NEGATIVE NEGATIVE   Leukocytes, UA NEGATIVE NEGATIVE   RBC / HPF 0-5 0 - 5 RBC/hpf   WBC, UA 0-5 0 - 5 WBC/hpf   Bacteria, UA NONE SEEN NONE SEEN   Squamous Epithelial / LPF NONE SEEN NONE SEEN  Rapid urine drug screen (hospital performed)     Status: None   Collection Time: 02/13/17  7:19 PM  Result Value Ref Range   Opiates NONE DETECTED NONE DETECTED   Cocaine NONE DETECTED NONE DETECTED   Benzodiazepines NONE DETECTED NONE DETECTED   Amphetamines NONE DETECTED NONE DETECTED    Tetrahydrocannabinol NONE DETECTED NONE DETECTED   Barbiturates NONE DETECTED NONE DETECTED    Comment: (NOTE) DRUG SCREEN FOR MEDICAL PURPOSES ONLY.  IF CONFIRMATION IS NEEDED FOR ANY PURPOSE, NOTIFY LAB WITHIN 5 DAYS. LOWEST DETECTABLE LIMITS FOR URINE DRUG SCREEN Drug Class                     Cutoff (ng/mL) Amphetamine and metabolites    1000 Barbiturate and metabolites    200 Benzodiazepine                 829 Tricyclics and metabolites     300 Opiates and metabolites        300 Cocaine and metabolites        300 THC                            50   CBC     Status: Abnormal   Collection  Time: 02/13/17  8:30 PM  Result Value Ref Range   WBC 4.9 4.0 - 10.5 K/uL   RBC 4.85 3.87 - 5.11 MIL/uL   Hemoglobin 10.9 (L) 12.0 - 15.0 g/dL   HCT 33.4 (L) 36.0 - 46.0 %   MCV 68.9 (L) 78.0 - 100.0 fL   MCH 22.5 (L) 26.0 - 34.0 pg   MCHC 32.6 30.0 - 36.0 g/dL   RDW 17.2 (H) 11.5 - 15.5 %   Platelets 270 150 - 400 K/uL  Comprehensive metabolic panel     Status: Abnormal   Collection Time: 02/13/17  8:30 PM  Result Value Ref Range   Sodium 140 135 - 145 mmol/L   Potassium 3.6 3.5 - 5.1 mmol/L   Chloride 105 101 - 111 mmol/L   CO2 27 22 - 32 mmol/L   Glucose, Bld 98 65 - 99 mg/dL   BUN 8 6 - 20 mg/dL   Creatinine, Ser 0.59 0.44 - 1.00 mg/dL   Calcium 9.4 8.9 - 10.3 mg/dL   Total Protein 7.8 6.5 - 8.1 g/dL   Albumin 4.0 3.5 - 5.0 g/dL   AST 22 15 - 41 U/L   ALT 11 (L) 14 - 54 U/L   Alkaline Phosphatase 60 38 - 126 U/L   Total Bilirubin 1.3 (H) 0.3 - 1.2 mg/dL   GFR calc non Af Amer >60 >60 mL/min   GFR calc Af Amer >60 >60 mL/min    Comment: (NOTE) The eGFR has been calculated using the CKD EPI equation. This calculation has not been validated in all clinical situations. eGFR's persistently <60 mL/min signify possible Chronic Kidney Disease.    Anion gap 8 5 - 15  Ethanol     Status: None   Collection Time: 02/13/17  8:30 PM  Result Value Ref Range   Alcohol, Ethyl (B) <10  <10 mg/dL    Comment:        LOWEST DETECTABLE LIMIT FOR SERUM ALCOHOL IS 10 mg/dL FOR MEDICAL PURPOSES ONLY   Salicylate level     Status: None   Collection Time: 02/13/17  8:30 PM  Result Value Ref Range   Salicylate Lvl <9.6 2.8 - 30.0 mg/dL  Acetaminophen level     Status: Abnormal   Collection Time: 02/13/17  8:30 PM  Result Value Ref Range   Acetaminophen (Tylenol), Serum <10 (L) 10 - 30 ug/mL    Comment:        THERAPEUTIC CONCENTRATIONS VARY SIGNIFICANTLY. A RANGE OF 10-30 ug/mL MAY BE AN EFFECTIVE CONCENTRATION FOR MANY PATIENTS. HOWEVER, SOME ARE BEST TREATED AT CONCENTRATIONS OUTSIDE THIS RANGE. ACETAMINOPHEN CONCENTRATIONS >150 ug/mL AT 4 HOURS AFTER INGESTION AND >50 ug/mL AT 12 HOURS AFTER INGESTION ARE OFTEN ASSOCIATED WITH TOXIC REACTIONS.     Current Facility-Administered Medications  Medication Dose Route Frequency Provider Last Rate Last Dose  . alum & mag hydroxide-simeth (MAALOX/MYLANTA) 200-200-20 MG/5ML suspension 30 mL  30 mL Oral Q6H PRN Domenic Moras, PA-C      . metoprolol succinate (TOPROL-XL) 24 hr tablet 25 mg  25 mg Oral Daily Domenic Moras, PA-C   25 mg at 02/15/17 1018  . ondansetron (ZOFRAN) tablet 4 mg  4 mg Oral Q8H PRN Domenic Moras, PA-C   4 mg at 02/14/17 0225  . risperiDONE (RISPERDAL) tablet 0.5 mg  0.5 mg Oral BID Patrecia Pour, NP   0.5 mg at 02/15/17 1018   Current Outpatient Medications  Medication Sig Dispense Refill  . acetaminophen (TYLENOL) 325 MG  tablet Take 2 tablets (650 mg total) by mouth every 6 (six) hours as needed for mild pain or moderate pain. 60 tablet 0  . alendronate (FOSAMAX) 70 MG tablet Take 70 mg by mouth every 7 (seven) days. Take with a full glass of water on an empty stomach on Saturday    . bisacodyl (DULCOLAX) 5 MG EC tablet Take 5 mg by mouth daily as needed for mild constipation or moderate constipation.    . cyclobenzaprine (FLEXERIL) 5 MG tablet Take 1 tablet (5 mg total) by mouth 3 (three) times daily  as needed. 15 tablet 0  . gabapentin (NEURONTIN) 300 MG capsule Take 1 capsule (300 mg total) by mouth 2 (two) times daily. 60 capsule 0  . loratadine (CLARITIN) 10 MG tablet Take 10 mg by mouth daily.    . metoprolol succinate (TOPROL-XL) 25 MG 24 hr tablet Take 25 mg by mouth daily.  2  . Multiple Vitamins-Minerals (MULTIVITAMINS THER. W/MINERALS) TABS tablet Take 1 tablet by mouth daily.    . pantoprazole (PROTONIX) 40 MG tablet Take 1 tablet (40 mg total) by mouth daily. 30 tablet 0  . senna-docusate (SENOKOT-S) 8.6-50 MG tablet Take 1 tablet by mouth at bedtime. 30 tablet 0  . timolol (TIMOPTIC) 0.5 % ophthalmic solution Place 1 drop into both eyes daily.    . traMADol (ULTRAM) 50 MG tablet Take 1 tablet (50 mg total) by mouth every 6 (six) hours as needed. 15 tablet 0  . Travoprost, BAK Free, (TRAVATAN) 0.004 % SOLN ophthalmic solution Place 1 drop into both eyes at bedtime.    . bisacodyl (DULCOLAX) 10 MG suppository Place 1 suppository (10 mg total) rectally daily as needed for moderate constipation. (Patient not taking: Reported on 02/13/2017) 12 suppository 0  . celecoxib (CELEBREX) 100 MG capsule Take 1 capsule (100 mg total) by mouth 2 (two) times daily. (Patient not taking: Reported on 02/13/2017) 14 capsule 0  . lidocaine (LIDODERM) 5 % Place 1 patch onto the skin daily. Remove & Discard patch within 12 hours or as directed by MD (Patient not taking: Reported on 02/13/2017) 20 patch 0  . magnesium oxide (MAG-OX) 400 MG tablet Take 1 tablet (400 mg total) by mouth daily. (Patient not taking: Reported on 02/13/2017) 30 tablet 0  . metoprolol tartrate (LOPRESSOR) 25 MG tablet Take 0.5 tablets (12.5 mg total) by mouth 2 (two) times daily. (Patient not taking: Reported on 02/13/2017) 60 tablet 0  . predniSONE (DELTASONE) 10 MG tablet Label  & dispense according to the schedule below. 6 Pills PO on day one then, 5 Pills PO on day two, 4 Pills PO on day three, 3Pills PO on day four, 2 Pills PO on  day five, 1 Pills PO on day six,  then STOP.  Total of 21 tabs (Patient not taking: Reported on 02/13/2017) 21 tablet 0    Musculoskeletal: Strength & Muscle Tone: within normal limits Gait & Station: normal Patient leans: N/A  Psychiatric Specialty Exam: Physical Exam  Constitutional: She appears well-developed and well-nourished.  HENT:  Head: Normocephalic.  Neck: Normal range of motion.  Respiratory: Effort normal.  Musculoskeletal: Normal range of motion.  Neurological: She is alert.  Psychiatric: She has a normal mood and affect. Her speech is normal and behavior is normal. Judgment and thought content normal. Cognition and memory are normal.    Review of Systems  All other systems reviewed and are negative.   Blood pressure (!) 122/56, pulse 79, temperature 97.6 F (36.4  C), temperature source Oral, resp. rate 16, height 5' 4"  (1.626 m), weight 68 kg (150 lb), SpO2 98 %.Body mass index is 25.75 kg/m.  General Appearance: Casual  Eye Contact:  Good  Speech:  Clear and Coherent  Volume:  Normal  Mood:  Euthymic  Affect:  Congruent   Thought Process:  WDL  Orientation:  Full (Time, Place, and Person)  Thought Content:  WDL  Suicidal Thoughts:  No  Homicidal Thoughts:  No  Memory:  Immediate;   Fair Recent;   Fair Remote;   Good  Judgement:  Fair  Insight:  Fair  Psychomotor Activity:  Normal  Concentration:  Concentration: Good and Attention Span: Good  Recall:  [place] of Knowledge:  Good  Language:  Good  Akathisia:  No    AIMS (if indicated):     Assets:  Communication Skills Desire for Improvement Financial Resources/Insurance Housing Resilience Social Support  ADL's:  Intact  Cognition:  Impaired,  Mild  Sleep:        Treatment Plan Summary: Daily contact with patient to assess and evaluate symptoms and progress in treatment, Medication management and Plan acute psychosis:  -Crisis stabilization -Medication management:  Continued medical  medications and Risperdal 0.5 mg BID for hallucinations -Individual counseling  Disposition: Discharge hom  Waylan Boga, NP 02/15/2017 1:10 PM  Patient seen face-to-face for psychiatric evaluation, chart reviewed and case discussed with the physician extender and developed treatment plan. Reviewed the information documented and agree with the treatment plan. Corena Pilgrim, MD

## 2017-02-15 NOTE — [facility] Suicide Risk Assessment (Signed)
Suicide Risk Assessment  Discharge Assessment   [facility]   Principal Problem: Acute psychosis [facility](HCC) Discharge Diagnoses:  Patient Active Problem List   Diagnosis Date Noted  . Acute psychosis ([facility]) [F23] 02/14/2017    Priority: High  . Hallucination, visual [R44.1] 02/14/2017  . Low back pain [M54.5] 11/08/2016  . Osteoporosis, unspecified [M81.0] 09/30/2012  . Branch retinal artery occlusion, left eye [H34.232] 09/30/2012  . HYPERTENSION [I10] 04/05/2010  . VENOUS INSUFFICIENCY, CHRONIC [I87.2] 04/05/2010  . GERD [K21.9] 04/05/2010  . CHEST PAIN [R07.9] 04/05/2010    Total Time spent with patient: 30 minutes   Musculoskeletal: Strength & Muscle Tone: within normal limits Gait & Station: normal Patient leans: N/A  Psychiatric Specialty Exam: Physical Exam  Constitutional: She appears well-developed and well-nourished.  HENT:  Head: Normocephalic.  Neck: Normal range of motion.  Respiratory: Effort normal.  Musculoskeletal: Normal range of motion.  Neurological: She is alert.  Psychiatric: She has a normal mood and affect. Her speech is normal and behavior is normal. Judgment and thought content normal. Cognition and memory are normal.    Review of Systems  All other systems reviewed and are negative.   Blood pressure (!) 122/56, pulse 79, temperature 97.6 F (36.4 C), temperature source Oral, resp. rate 16, height 5\' 4"  (1.626 m), weight 68 kg (150 lb), SpO2 98 %.Body mass index is 25.75 kg/m.  General Appearance: Casual  Eye Contact:  Good  Speech:  Clear and Coherent  Volume:  Normal  Mood:  Euthymic  Affect:  Congruent   Thought Process:  WDL  Orientation:  Full (Time, Place, and Person)  Thought Content:  WDL  Suicidal Thoughts:  No  Homicidal Thoughts:  No  Memory:  Immediate;   Fair Recent;   Fair Remote;   Good  Judgement:  Fair  Insight:  Fair  Psychomotor Activity:  Normal  Concentration:  Concentration: Good and  Attention Span: Good  Recall:  Fair  Fund of Knowledge:  Good  Language:  Good  Akathisia:  No    AIMS (if indicated):     Assets:  Communication Skills Desire for Improvement Financial Resources/Insurance Housing Resilience Social Support  ADL's:  Intact  Cognition:  Impaired,  Mild  Sleep:       Mental Status Per Nursing Assessment::   On Admission:   hallucinations  Demographic Factors:  Age 84 or older and Living alone  Loss Factors: NA  Historical Factors: NA  Risk Reduction Factors:   Sense of responsibility to family and Positive social support  Continued Clinical Symptoms:  None  Cognitive Features That Contribute To Risk:  None    Suicide Risk:  Minimal: No identifiable suicidal ideation.  Patients presenting with no risk factors but with morbid ruminations; may be classified as minimal risk based on the severity of the depressive symptoms    Plan Of Care/Follow-up recommendations:  Activity:  as tolerated Diet:  heart healthy diet  Lorae Roig, NP 02/15/2017, 4:19 PM

## 2017-02-15 NOTE — Progress Notes (Signed)
CSW met with patient and daughter via bedside to discuss discharge plans. Patient is medically/ psychiatrically cleared at this time- patient has no concerns leaving and only questions if she had any medication changes. Risperdal .5 MG change and informed patient. Patients daughter, Macie Burows, voiced concerns with patient returning home on her own at this time however patient stated she enjoys living on her own. Patient and family voiced appreciation of CSW.   Kingsley Spittle, [facility] Emergency Room Clinical Social Worker [phone]

## 2017-03-03 DIAGNOSIS — H401123 Primary open-angle glaucoma, left eye, severe stage: Secondary | ICD-10-CM | POA: Insufficient documentation

## 2017-04-22 DIAGNOSIS — Z9841 Cataract extraction status, right eye: Secondary | ICD-10-CM | POA: Insufficient documentation

## 2017-06-25 ENCOUNTER — Encounter: Payer: Medicare Other | Admitting: Psychology

## 2017-09-08 ENCOUNTER — Emergency Department (HOSPITAL_COMMUNITY): Payer: Medicare Other

## 2017-09-08 ENCOUNTER — Emergency Department (HOSPITAL_COMMUNITY)
Admission: EM | Admit: 2017-09-08 | Discharge: 2017-09-08 | Disposition: A | Discharge status: Home / Self Care | Payer: Medicare Other | Attending: Emergency Medicine | Admitting: Emergency Medicine

## 2017-09-08 ENCOUNTER — Encounter (HOSPITAL_COMMUNITY): Payer: Self-pay

## 2017-09-08 ENCOUNTER — Other Ambulatory Visit: Payer: Self-pay

## 2017-09-08 DIAGNOSIS — S3992XA Unspecified injury of lower back, initial encounter: Secondary | ICD-10-CM | POA: Insufficient documentation

## 2017-09-08 DIAGNOSIS — Y929 Unspecified place or not applicable: Secondary | ICD-10-CM | POA: Insufficient documentation

## 2017-09-08 DIAGNOSIS — Z79899 Other long term (current) drug therapy: Secondary | ICD-10-CM | POA: Diagnosis not present

## 2017-09-08 DIAGNOSIS — S3210XA Unspecified fracture of sacrum, initial encounter for closed fracture: Secondary | ICD-10-CM | POA: Insufficient documentation

## 2017-09-08 DIAGNOSIS — I10 Essential (primary) hypertension: Secondary | ICD-10-CM | POA: Diagnosis not present

## 2017-09-08 DIAGNOSIS — W19XXXA Unspecified fall, initial encounter: Secondary | ICD-10-CM | POA: Diagnosis not present

## 2017-09-08 DIAGNOSIS — Y939 Activity, unspecified: Secondary | ICD-10-CM | POA: Insufficient documentation

## 2017-09-08 DIAGNOSIS — F0390 Unspecified dementia without behavioral disturbance: Secondary | ICD-10-CM | POA: Insufficient documentation

## 2017-09-08 DIAGNOSIS — Y999 Unspecified external cause status: Secondary | ICD-10-CM | POA: Insufficient documentation

## 2017-09-08 DIAGNOSIS — S0990XA Unspecified injury of head, initial encounter: Secondary | ICD-10-CM | POA: Diagnosis present

## 2017-09-08 MED ORDER — OXYCODONE-ACETAMINOPHEN 5-325 MG PO TABS: 1.0000 | ORAL_TABLET | ORAL | 0 refills | Status: DC | PRN

## 2017-09-08 NOTE — Discharge Instructions (Addendum)
You have fractured your tailbone or sacrum.  Tylenol or ibuprofen for pain.  Ice pack.  Use your walker.  Prescription for pain medicine if the over-the-counter products do not work.  Follow-up with your primary care doctor

## 2017-09-08 NOTE — ED Triage Notes (Signed)
Fell backwards in yard Sunday, now c/o low back pain. Hx of dementia, no LOC.

## 2017-09-08 NOTE — ED Notes (Signed)
Bed: WA08 Expected date:  Expected time:  Means of arrival:  Comments: 

## 2017-09-09 NOTE — ED Provider Notes (Addendum)
[facility]-EMERGENCY DEPT Provider Note   CSN: [id number] Arrival date & time: 09/08/17  1248     History   Chief Complaint Chief Complaint  Patient presents with  . Fall  . Back Pain    HPI Theresa Bauer is a 84 y.o. female.  Level 5 caveat for mild dementia.  Accidental fall on Sunday striking her lower back, left hip, occipital area of the scalp.  Patient is ambulatory.  Daughter reports normal behavior.  No neuro neurological deficits or radicular symptoms.  No bowel or bladder incontinence.     Past Medical History:  Diagnosis Date  . GERD (gastroesophageal reflux disease)   . Glaucoma   . Hypertension     Patient Active Problem List   Diagnosis Date Noted  . Hallucination, visual 02/14/2017  . Acute psychosis (HCC) 02/14/2017  . Low back pain 11/08/2016  . Osteoporosis, unspecified 09/30/2012  . Branch retinal artery occlusion, left eye 09/30/2012  . HYPERTENSION 04/05/2010  . VENOUS INSUFFICIENCY, CHRONIC 04/05/2010  . GERD 04/05/2010  . CHEST PAIN 04/05/2010    Past Surgical History:  Procedure Laterality Date  . CATARACT EXTRACTION Left   . GLAUCOMA REPAIR Left   . REPLACEMENT TOTAL KNEE BILATERAL  2007/2008     OB History    Gravida  5   Para  5   Term  5   Preterm  0   AB  0   Living        SAB      TAB      Ectopic      Multiple      Live Births               Home Medications    Prior to Admission medications   Medication Sig Start Date End Date Taking? Authorizing Provider  acetaminophen (TYLENOL) 325 MG tablet Take 2 tablets (650 mg total) by mouth every 6 (six) hours as needed for mild pain or moderate pain. 04/11/16  Yes Everlene Farrier, PA-C  alendronate (FOSAMAX) 70 MG tablet Take 70 mg by mouth every 7 (seven) days. Take with a full glass of water on an empty stomach on Saturday   Yes [provider]  hydrOXYzine (ATARAX/VISTARIL) 10 MG tablet Take 10 mg by mouth 2 (two) times daily as  needed for itching. 08/05/17  Yes [provider]  loratadine (CLARITIN) 10 MG tablet Take 10 mg by mouth daily.   Yes [provider]  metoprolol succinate (TOPROL-XL) 25 MG 24 hr tablet Take 25 mg by mouth daily. 12/10/16  Yes [provider]  Multiple Vitamins-Minerals (MULTIVITAMINS THER. W/MINERALS) TABS tablet Take 1 tablet by mouth daily.   Yes [provider]  pantoprazole (PROTONIX) 40 MG tablet Take 1 tablet (40 mg total) by mouth daily. 10/02/12  Yes Oti, Osie Cheeks, MD  senna-docusate (SENOKOT-S) 8.6-50 MG tablet Take 1 tablet by mouth at bedtime. 11/11/16  Yes Albertine Grates, MD  timolol (TIMOPTIC) 0.25 % ophthalmic solution Place 1 drop into both eyes daily. 08/27/17  Yes [provider]  Travoprost, BAK Free, (TRAVATAN) 0.004 % SOLN ophthalmic solution Place 1 drop into both eyes at bedtime.   Yes [provider]  cyclobenzaprine (FLEXERIL) 5 MG tablet Take 1 tablet (5 mg total) by mouth 3 (three) times daily as needed. Patient not taking: Reported on 09/08/2017 11/05/16   Charlynne Pander, MD  gabapentin (NEURONTIN) 300 MG capsule Take 1 capsule (300 mg total) by mouth  2 (two) times daily. Patient not taking: Reported on 09/08/2017 11/11/16   Albertine GratesXu, Fang, MD  oxyCODONE-acetaminophen (PERCOCET) 5-325 MG tablet Take 1 tablet by mouth every 4 (four) hours as needed. 09/08/17   Donnetta Hutchingook, Kemba Hoppes, MD  risperiDONE (RISPERDAL) 0.5 MG tablet Take 1 tablet (0.5 mg total) by mouth 2 (two) times daily. Patient not taking: Reported on 09/08/2017 02/15/17   Charm RingsLord, Jamison Y, NP  traMADol (ULTRAM) 50 MG tablet Take 1 tablet (50 mg total) by mouth every 6 (six) hours as needed. Patient not taking: Reported on 09/08/2017 11/05/16   Charlynne PanderYao, David Hsienta, MD    Family History Family History  Problem Relation Age of Onset  . Heart attack Mother   . Heart attack Father   . Osteoarthritis Father     Social History Social History   Tobacco Use  . Smoking status: Never Smoker    . Smokeless tobacco: Never Used  Substance Use Topics  . Alcohol use: No    Alcohol/week: 0.0 oz  . Drug use: No     Allergies   Lisinopril; Darvocet [propoxyphene n-acetaminophen]; Aspirin; Brimonidine; Penicillins; and Darvon [propoxyphene]   Review of Systems Review of Systems  Unable to perform ROS: Dementia     Physical Exam Updated Vital Signs BP (!) 171/118   Pulse 70   Resp (!) 24   Ht 5' (1.524 m)   Wt 61.2 kg (135 lb)   SpO2 100%   BMI 26.37 kg/m   Physical Exam  Constitutional: She is oriented to person, place, and time. She appears well-developed and well-nourished.  Pleasant, no acute distress  HENT:  Head: Normocephalic.  Tender occipital scalp  Eyes: Conjunctivae are normal.  Neck: Neck supple.  Cardiovascular: Normal rate and regular rhythm.  Pulmonary/Chest: Effort normal and breath sounds normal.  Abdominal: Soft. Bowel sounds are normal.  Musculoskeletal:  Tender sacrum; tender left lateral hip.  Neurological: She is alert and oriented to person, place, and time.  Skin: Skin is warm and dry.  Psychiatric: She has a normal mood and affect. Her behavior is normal.  Nursing note and vitals reviewed.    ED Treatments / Results  Labs (all labs ordered are listed, but only abnormal results are displayed) Labs Reviewed - No data to display  EKG None  Radiology Dg Sacrum/coccyx  Result Date: 09/08/2017 CLINICAL DATA:  Recent fall with coccygeal pain, initial encounter EXAM: SACRUM AND COCCYX - 2+ VIEW COMPARISON:  None. FINDINGS: The pelvic ring is intact. Mild irregularity is noted along the distal aspect of the sacrum consistent with a mildly displaced fracture. No other focal abnormality is seen. IMPRESSION: Mildly displaced sacral fracture distally. Electronically Signed   By: Alcide CleverMark  Lukens M.D.   On: 09/08/2017 13:44   Ct Head Wo Contrast  Result Date: 09/08/2017 CLINICAL DATA:  Headache following fall 2 days prior. Reported history of  dementia EXAM: CT HEAD WITHOUT CONTRAST TECHNIQUE: Contiguous axial images were obtained from the base of the skull through the vertex without intravenous contrast. COMPARISON:  February 13, 2017 FINDINGS: Brain: There is moderate diffuse atrophy. There is no intracranial mass, hemorrhage, extra-axial fluid collection, or midline shift. There is patchy small vessel disease throughout the centra semiovale bilaterally. There is small vessel disease in both internal and external capsule regions bilaterally. No acute infarct is demonstrable. Vascular: No hyperdense vessel is appreciable. There are foci of calcification in each carotid siphon. Skull: The bony calvarium appears intact. Sinuses/Orbits: There is opacification throughout much of the visualized  right maxillary antrum, a stable finding. There is mucosal thickening in several ethmoid air cells. Orbits appear symmetric bilaterally. Other: Mastoid air cells are clear. IMPRESSION: Atrophy with periventricular small vessel disease. Small vessel disease also noted in basal ganglia regions. No acute infarct evident. No mass or hemorrhage. Foci of arterial vascular calcification noted. Areas of paranasal sinus disease noted, most notably in the right maxillary antrum. Electronically Signed   By: Bretta Bang III M.D.   On: 09/08/2017 14:59   Dg Hip Unilat W Or Wo Pelvis 2-3 Views Left  Result Date: 09/08/2017 CLINICAL DATA:  Larey Seat backwards 2 days ago, persistent low back pain, pelvis, and left hip pain. EXAM: DG HIP (WITH OR WITHOUT PELVIS) 2-3V LEFT COMPARISON:  Left hip series of September 09, 2016 FINDINGS: The bony pelvis is subjectively osteopenic. There is no acute pelvic fracture nor lytic or blastic lesion. There are degenerative changes of the lower lumbar spine. AP and lateral views of the left hip reveal mild asymmetric narrowing of the joint space which is stable. The articular surfaces of the left femoral head and acetabulum remain smoothly rounded.  The femoral necks, intertrochanteric, and subtrochanteric regions are normal. IMPRESSION: There is mild chronic degenerative change of the left hip. There is no acute fracture nor dislocation. The bony pelvis is unremarkable. Electronically Signed   By: David  [place] M.D.   On: 09/08/2017 14:51    Procedures Procedures (including critical care time)  Medications Ordered in ED Medications - No data to display   Initial Impression / Assessment and Plan / ED Course  I have reviewed the triage vital signs and the nursing notes.  Pertinent labs & imaging results that were available during my care of the patient were reviewed by me and considered in my medical decision making (see chart for details).     Patient presents with accidental fall.  Imaging obtained as follows: CT head negative, plain films left hip negative, sacrum/ coccyx films reveal a mildly displaced distal sacral fracture.  These findings were discussed with the patient and her daughter.  Discharge medication Percocet.  Final Clinical Impressions(s) / ED Diagnoses   Final diagnoses:  Fall, initial encounter  Closed fracture of sacrum, unspecified portion of sacrum, initial encounter [facility])    ED Discharge Orders        Ordered    oxyCODONE-acetaminophen (PERCOCET) 5-325 MG tablet  Every 4 hours PRN     09/08/17 1537       Donnetta Hutching, MD 09/09/17 1608    Donnetta Hutching, MD 09/09/17 [phone]

## 2018-08-15 ENCOUNTER — Emergency Department (HOSPITAL_COMMUNITY)
Admission: EM | Admit: 2018-08-15 | Discharge: 2018-08-15 | Disposition: A | Discharge status: Home / Self Care | Payer: Medicare Other | Attending: Emergency Medicine | Admitting: Emergency Medicine

## 2018-08-15 ENCOUNTER — Emergency Department (HOSPITAL_COMMUNITY): Payer: Medicare Other

## 2018-08-15 ENCOUNTER — Encounter (HOSPITAL_COMMUNITY): Payer: Self-pay

## 2018-08-15 ENCOUNTER — Emergency Department (HOSPITAL_BASED_OUTPATIENT_CLINIC_OR_DEPARTMENT_OTHER): Payer: Medicare Other

## 2018-08-15 ENCOUNTER — Other Ambulatory Visit: Payer: Self-pay

## 2018-08-15 DIAGNOSIS — Y92013 Bedroom of single-family (private) house as the place of occurrence of the external cause: Secondary | ICD-10-CM | POA: Diagnosis not present

## 2018-08-15 DIAGNOSIS — R609 Edema, unspecified: Secondary | ICD-10-CM

## 2018-08-15 DIAGNOSIS — I10 Essential (primary) hypertension: Secondary | ICD-10-CM | POA: Diagnosis not present

## 2018-08-15 DIAGNOSIS — R441 Visual hallucinations: Secondary | ICD-10-CM | POA: Diagnosis not present

## 2018-08-15 DIAGNOSIS — M546 Pain in thoracic spine: Secondary | ICD-10-CM | POA: Diagnosis not present

## 2018-08-15 DIAGNOSIS — Z79899 Other long term (current) drug therapy: Secondary | ICD-10-CM | POA: Insufficient documentation

## 2018-08-15 DIAGNOSIS — W06XXXA Fall from bed, initial encounter: Secondary | ICD-10-CM | POA: Insufficient documentation

## 2018-08-15 DIAGNOSIS — Y999 Unspecified external cause status: Secondary | ICD-10-CM | POA: Insufficient documentation

## 2018-08-15 DIAGNOSIS — M25551 Pain in right hip: Secondary | ICD-10-CM | POA: Diagnosis present

## 2018-08-15 DIAGNOSIS — R0789 Other chest pain: Secondary | ICD-10-CM

## 2018-08-15 DIAGNOSIS — F0390 Unspecified dementia without behavioral disturbance: Secondary | ICD-10-CM | POA: Insufficient documentation

## 2018-08-15 DIAGNOSIS — R600 Localized edema: Secondary | ICD-10-CM | POA: Insufficient documentation

## 2018-08-15 DIAGNOSIS — R443 Hallucinations, unspecified: Secondary | ICD-10-CM

## 2018-08-15 DIAGNOSIS — W19XXXA Unspecified fall, initial encounter: Secondary | ICD-10-CM

## 2018-08-15 DIAGNOSIS — Y939 Activity, unspecified: Secondary | ICD-10-CM | POA: Diagnosis not present

## 2018-08-15 HISTORY — DX: Unspecified dementia, unspecified severity, without behavioral disturbance, psychotic disturbance, mood disturbance, and anxiety: F03.90

## 2018-08-15 HISTORY — DX: Visual hallucinations: R44.1

## 2018-08-15 LAB — URINALYSIS, ROUTINE W REFLEX MICROSCOPIC
Bacteria, UA: NONE SEEN
Bilirubin Urine: NEGATIVE
Glucose, UA: NEGATIVE mg/dL
Ketones, ur: NEGATIVE mg/dL
Leukocytes,Ua: NEGATIVE
Nitrite: NEGATIVE
Protein, ur: NEGATIVE mg/dL
Specific Gravity, Urine: 1.003 — ABNORMAL LOW (ref 1.005–1.030)
pH: 7 (ref 5.0–8.0)

## 2018-08-15 LAB — COMPREHENSIVE METABOLIC PANEL
ALT: 10 U/L (ref 0–44)
AST: 19 U/L (ref 15–41)
Albumin: 3.5 g/dL (ref 3.5–5.0)
Alkaline Phosphatase: 48 U/L (ref 38–126)
Anion gap: 10 (ref 5–15)
BUN: 6 mg/dL — ABNORMAL LOW (ref 8–23)
CO2: 26 mmol/L (ref 22–32)
Calcium: 9.1 mg/dL (ref 8.9–10.3)
Chloride: 99 mmol/L (ref 98–111)
Creatinine, Ser: 0.77 mg/dL (ref 0.44–1.00)
GFR calc Af Amer: >60 mL/min (ref >60)
GFR calc non Af Amer: >60 mL/min (ref >60)
Glucose, Bld: 93 mg/dL (ref 70–99)
Potassium: 3.6 mmol/L (ref 3.5–5.1)
Sodium: 135 mmol/L (ref 135–145)
Total Bilirubin: 1 mg/dL (ref 0.3–1.2)
Total Protein: 7 g/dL (ref 6.5–8.1)

## 2018-08-15 LAB — CBC WITH DIFFERENTIAL/PLATELET
Abs Immature Granulocytes: 0.01 10*3/uL (ref 0.00–0.07)
Basophils Absolute: 0 10*3/uL (ref 0.0–0.1)
Basophils Relative: 1 %
Eosinophils Absolute: 0.1 10*3/uL (ref 0.0–0.5)
Eosinophils Relative: 1 %
HCT: 30.6 % — ABNORMAL LOW (ref 36.0–46.0)
Hemoglobin: 9.8 g/dL — ABNORMAL LOW (ref 12.0–15.0)
Immature Granulocytes: 0 %
Lymphocytes Relative: 32 %
Lymphs Abs: 1.3 10*3/uL (ref 0.7–4.0)
MCH: 23.1 pg — ABNORMAL LOW (ref 26.0–34.0)
MCHC: 32 g/dL (ref 30.0–36.0)
MCV: 72 fL — ABNORMAL LOW (ref 80.0–100.0)
Monocytes Absolute: 0.8 10*3/uL (ref 0.1–1.0)
Monocytes Relative: 18 %
Neutro Abs: 2.1 10*3/uL (ref 1.7–7.7)
Neutrophils Relative %: 48 %
Platelets: 200 10*3/uL (ref 150–400)
RBC: 4.25 MIL/uL (ref 3.87–5.11)
RDW: 16.3 % — ABNORMAL HIGH (ref 11.5–15.5)
WBC: 4.2 10*3/uL (ref 4.0–10.5)
nRBC: 0 % (ref 0.0–0.2)

## 2018-08-15 LAB — LACTIC ACID, PLASMA
Lactic Acid, Venous: 1 mmol/L (ref 0.5–1.9)
Lactic Acid, Venous: 0.9 mmol/L (ref 0.5–1.9)

## 2018-08-15 LAB — PHOSPHORUS: Phosphorus: 3.7 mg/dL (ref 2.5–4.6)

## 2018-08-15 LAB — RAPID URINE DRUG SCREEN, HOSP PERFORMED
Amphetamines: NOT DETECTED
Barbiturates: NOT DETECTED
Benzodiazepines: NOT DETECTED
Cocaine: NOT DETECTED
Opiates: NOT DETECTED
Tetrahydrocannabinol: NOT DETECTED

## 2018-08-15 LAB — TROPONIN I (HIGH SENSITIVITY)
Troponin I (High Sensitivity): 4 ng/L (ref <18)
Troponin I (High Sensitivity): 5 ng/L (ref <18)

## 2018-08-15 LAB — MAGNESIUM: Magnesium: 2.2 mg/dL (ref 1.7–2.4)

## 2018-08-15 LAB — TSH: TSH: 3.264 u[IU]/mL (ref 0.350–4.500)

## 2018-08-15 LAB — AMMONIA: Ammonia: 14 umol/L (ref 9–35)

## 2018-08-15 NOTE — ED Notes (Signed)
Patient transported to CT 

## 2018-08-15 NOTE — ED Triage Notes (Signed)
Pt arrives POV with daughter, Nalani Andreen from home. Pt fell out of bed yesterday morning after "seeing a python snake under her bed." Pt's daughter stated the pt called EMS yesterday after fall but was not taken to the ED. Pt has hx of dementia, auditory and visual hallucinations that have progressively worsened over the past "several weeks." Daughter states pt is having hip pain and could have possibly hit her head.

## 2018-08-15 NOTE — ED Notes (Signed)
ED Provider at bedside. 

## 2018-08-15 NOTE — ED Notes (Signed)
Patient transported to X-ray 

## 2018-08-15 NOTE — ED Notes (Signed)
Vascular tech at pt bedside. 

## 2018-08-15 NOTE — Progress Notes (Signed)
VASCULAR LAB PRELIMINARY  PRELIMINARY  PRELIMINARY  PRELIMINARY  Left lower extremity venous duplex completed.    Preliminary report:  See CV proc for preliminary results.    Gave Dr. Johnney Killian results.   Sariya Trickey, RVT 08/15/2018, 12:54 PM

## 2018-08-15 NOTE — ED Provider Notes (Signed)
[facility]   CSN: [id number] Arrival date & time: 08/15/18  1009    History   Chief Complaint Chief Complaint  Patient presents with   Fall    visual hallucinations   Hip Pain    HPI Theresa Bauer is a 85 y.o. female.     HPI Patient's daughter is main historian.  The patient however does live independently with her daughter's checking in to assist.  She previously had been going to [facility] for adult daycare but since COVID has been at home.  She fell out of the bed yesterday and reports that she hit her head and the right chest and hip.  She did call EMS at that time.  Reportedly she was evaluated and everything was found to be okay.  The daughter reports that EMS then did not transport her to the emergency department.  Reportedly the other daughter who assist in taking care of the patient was with her at that time.  She is not currently present.  Patient continued to complain of pain in her right hip and right posterior thorax.  She has however been ambulatory using her walker.  Patient's daughter also notes that she has hallucinations.  She reports this has been going on for months but over the past several weeks does seem to be somewhat more frequent.  She reports that the patient's doctor actually put her on an antibiotic several days ago just in case she had a UTI although she had not tested positive for it.  She cannot recall which antibiotic that was. Past Medical History:  Diagnosis Date   Dementia [facility](HCC)    Per pt's daughter   GERD (gastroesophageal reflux disease)    Glaucoma    Hallucination, visual    per pt's daughter   Hypertension     Patient Active Problem List   Diagnosis Date Noted   Hallucination, visual 02/14/2017   Acute psychosis (HCC) 02/14/2017   Low back pain 11/08/2016   Osteoporosis, unspecified 09/30/2012   Branch retinal artery occlusion, left eye 09/30/2012   HYPERTENSION  04/05/2010   VENOUS INSUFFICIENCY, CHRONIC 04/05/2010   GERD 04/05/2010   CHEST PAIN 04/05/2010    Past Surgical History:  Procedure Laterality Date   CATARACT EXTRACTION Left    GLAUCOMA REPAIR Left    REPLACEMENT TOTAL KNEE BILATERAL  2007/2008     OB History    Gravida  5   Para  5   Term  5   Preterm  0   AB  0   Living        SAB      TAB      Ectopic      Multiple      Live Births               Home Medications    Prior to Admission medications   Medication Sig Start Date End Date Taking? Authorizing Provider  acetaminophen (TYLENOL) 325 MG tablet Take 2 tablets (650 mg total) by mouth every 6 (six) hours as needed for mild pain or moderate pain. 04/11/16   Everlene Farrieransie, William, PA-C  alendronate (FOSAMAX) 70 MG tablet Take 70 mg by mouth every 7 (seven) days. Take with a full glass of water on an empty stomach on Saturday    [provider]  cyclobenzaprine (FLEXERIL) 5 MG tablet Take 1 tablet (5 mg total) by mouth 3 (three) times daily as needed. Patient not taking:  Reported on 09/08/2017 11/05/16   Charlynne PanderYao, David Hsienta, MD  gabapentin (NEURONTIN) 300 MG capsule Take 1 capsule (300 mg total) by mouth 2 (two) times daily. Patient not taking: Reported on 09/08/2017 11/11/16   Albertine GratesXu, Fang, MD  hydrOXYzine (ATARAX/VISTARIL) 10 MG tablet Take 10 mg by mouth 2 (two) times daily as needed for itching. 08/05/17   [provider]  loratadine (CLARITIN) 10 MG tablet Take 10 mg by mouth daily.    [provider]  metoprolol succinate (TOPROL-XL) 25 MG 24 hr tablet Take 25 mg by mouth daily. 12/10/16   [provider]  Multiple Vitamins-Minerals (MULTIVITAMINS THER. W/MINERALS) TABS tablet Take 1 tablet by mouth daily.    [provider]  oxyCODONE-acetaminophen (PERCOCET) 5-325 MG tablet Take 1 tablet by mouth every 4 (four) hours as needed. 09/08/17   Donnetta Hutchingook, Brian, MD  pantoprazole (PROTONIX) 40 MG tablet Take 1 tablet (40 mg total)  by mouth daily. 10/02/12   Laveda Normanti, Chris N, MD  risperiDONE (RISPERDAL) 0.5 MG tablet Take 1 tablet (0.5 mg total) by mouth 2 (two) times daily. Patient not taking: Reported on 09/08/2017 02/15/17   Charm RingsLord, Jamison Y, NP  senna-docusate (SENOKOT-S) 8.6-50 MG tablet Take 1 tablet by mouth at bedtime. 11/11/16   Albertine GratesXu, Fang, MD  timolol (TIMOPTIC) 0.25 % ophthalmic solution Place 1 drop into both eyes daily. 08/27/17   [provider]  traMADol (ULTRAM) 50 MG tablet Take 1 tablet (50 mg total) by mouth every 6 (six) hours as needed. Patient not taking: Reported on 09/08/2017 11/05/16   Charlynne PanderYao, David Hsienta, MD  Travoprost, BAK Free, (TRAVATAN) 0.004 % SOLN ophthalmic solution Place 1 drop into both eyes at bedtime.    [provider]    Family History Family History  Problem Relation Age of Onset   Heart attack Mother    Heart attack Father    Osteoarthritis Father     Social History Social History   Tobacco Use   Smoking status: Never Smoker   Smokeless tobacco: Never Used  Substance Use Topics   Alcohol use: No    Alcohol/week: 0.0 standard drinks   Drug use: No     Allergies   Lisinopril, Darvocet [propoxyphene n-acetaminophen], Aspirin, Brimonidine, Penicillins, and Darvon [propoxyphene]   Review of Systems Review of Systems 10 Systems reviewed and are negative for acute change except as noted in the HPI.   Physical Exam Updated Vital Signs BP (!) 154/75    Pulse 87    Temp 98.2 F (36.8 C) (Oral)    Resp 18    SpO2 98%   Physical Exam Constitutional:      Comments: Patient is alert and interactive.  No respiratory distress.  Appears slightly fatigued and weak.  Assist in performing exam.  HENT:     Head: Normocephalic and atraumatic.     Nose: Nose normal.     Mouth/Throat:     Mouth: Mucous membranes are moist.     Pharynx: Oropharynx is clear.  Eyes:     Extraocular Movements: Extraocular movements intact.     Conjunctiva/sclera: Conjunctivae normal.      Pupils: Pupils are equal, round, and reactive to light.  Neck:     Comments: No C-spine tenderness to palpation Cardiovascular:     Rate and Rhythm: Normal rate and regular rhythm.  Pulmonary:     Effort: Pulmonary effort is normal.     Breath sounds: Normal breath sounds.  Chest:     Chest wall:  No tenderness.  Abdominal:     General: There is no distension.     Palpations: Abdomen is soft.     Tenderness: There is no abdominal tenderness. There is no guarding.  Musculoskeletal:     Comments: Left lower extremity has 1-2+ pitting edema to the knee.  Dorsalis pedis pulses are 2+ and symmetric.  No edema of the right lower extremity.  Skin:    General: Skin is warm and dry.  Neurological:     Comments: Patient is interactive.  She will answer questions.  She can use both upper extremities and lower extremities symmetrically.  No focal motor deficits.  Psychiatric:        Mood and Affect: Mood normal.      ED Treatments / Results  Labs (all labs ordered are listed, but only abnormal results are displayed) Labs Reviewed  COMPREHENSIVE METABOLIC PANEL - Abnormal; Notable for the following components:      Result Value   BUN 6 (*)    All other components within normal limits  CBC WITH DIFFERENTIAL/PLATELET - Abnormal; Notable for the following components:   Hemoglobin 9.8 (*)    HCT 30.6 (*)    MCV 72.0 (*)    MCH 23.1 (*)    RDW 16.3 (*)    All other components within normal limits  URINALYSIS, ROUTINE W REFLEX MICROSCOPIC - Abnormal; Notable for the following components:   Color, Urine STRAW (*)    Specific Gravity, Urine 1.003 (*)    Hgb urine dipstick SMALL (*)    All other components within normal limits  RAPID URINE DRUG SCREEN, HOSP PERFORMED  TSH  MAGNESIUM  PHOSPHORUS  LACTIC ACID, PLASMA  LACTIC ACID, PLASMA  AMMONIA  TROPONIN I (HIGH SENSITIVITY)  TROPONIN I (HIGH SENSITIVITY)    EKG None  Radiology Dg Chest 2 View  Result Date:  08/15/2018 CLINICAL DATA:  Hip pain post fall. EXAM: CHEST - 2 VIEW COMPARISON:  November 05, 2016 FINDINGS: The cardiac silhouette is enlarged. Calcific atherosclerotic disease of the aorta. Mediastinal contours appear intact. There is no evidence of focal airspace consolidation, pleural effusion or pneumothorax. Osseous structures are without acute abnormality. Soft tissues are grossly normal. IMPRESSION: No active cardiopulmonary disease. Electronically Signed   By: Fidela Salisbury M.D.   On: 08/15/2018 14:00   Ct Head Wo Contrast  Result Date: 08/15/2018 CLINICAL DATA:  Pt arrives POV with daughter, Laverne Hursey from home. Pt fell out of bed yesterday morning after "seeing a python snake under her bed." Pt's daughter stated the pt called EMS yesterday after fall but was not taken to the ED. Pt has hx of dementia, auditory and visual hallucinations that have progressively worsened over the past "several weeks." Daughter states pt is having hip pain and could have possibly hit her head. EXAM: CT HEAD WITHOUT CONTRAST CT CERVICAL SPINE WITHOUT CONTRAST TECHNIQUE: Multidetector CT imaging of the head and cervical spine was performed following the standard protocol without intravenous contrast. Multiplanar CT image reconstructions of the cervical spine were also generated. COMPARISON:  09/08/2017 FINDINGS: CT HEAD FINDINGS Brain: No evidence of acute infarction, hemorrhage, hydrocephalus, extra-axial collection or mass lesion/mass effect. There is ventricular and sulcal enlargement reflecting age-appropriate volume loss. Patchy white matter hypoattenuation is also noted consistent with moderate chronic microvascular ischemic change. Vascular: No hyperdense vessel or unexpected calcification. Skull: Normal. Negative for fracture or focal lesion. Sinuses/Orbits: Globes and orbits are unremarkable. Mucosal thickening and inspissated secretions mostly opacify the right maxillary sinus.  This is chronic associated  with wall thickening. Remaining sinuses are clear. Clear mastoid air cells. Other: None. CT CERVICAL SPINE FINDINGS Alignment: Normal. Skull base and vertebrae: No acute fracture. No primary bone lesion or focal pathologic process. Soft tissues and spinal canal: No prevertebral fluid or swelling. No visible canal hematoma. Disc levels: Moderate to marked loss of disc height at C3-C4. Mild loss of disc height at C3-C4 and C6-C7. Moderate loss of disc height at C5-C6. Spondylotic disc bulging is noted at these levels with uncovertebral spurring. No convincing disc herniation. Upper chest: No acute findings.  Clear lung apices. Other: None. IMPRESSION: HEAD CT 1. No acute intracranial abnormalities. 2. Moderate chronic microvascular ischemic change. 3. Chronic right maxillary sinus disease. CERVICAL CT 1. No fracture or acute finding. Electronically Signed   By: Amie Portland M.D.   On: 08/15/2018 11:53   Ct Cervical Spine Wo Contrast  Result Date: 08/15/2018 CLINICAL DATA:  Pt arrives POV with daughter, Johnathon Mittal from home. Pt fell out of bed yesterday morning after "seeing a python snake under her bed." Pt's daughter stated the pt called EMS yesterday after fall but was not taken to the ED. Pt has hx of dementia, auditory and visual hallucinations that have progressively worsened over the past "several weeks." Daughter states pt is having hip pain and could have possibly hit her head. EXAM: CT HEAD WITHOUT CONTRAST CT CERVICAL SPINE WITHOUT CONTRAST TECHNIQUE: Multidetector CT imaging of the head and cervical spine was performed following the standard protocol without intravenous contrast. Multiplanar CT image reconstructions of the cervical spine were also generated. COMPARISON:  09/08/2017 FINDINGS: CT HEAD FINDINGS Brain: No evidence of acute infarction, hemorrhage, hydrocephalus, extra-axial collection or mass lesion/mass effect. There is ventricular and sulcal enlargement reflecting age-appropriate volume  loss. Patchy white matter hypoattenuation is also noted consistent with moderate chronic microvascular ischemic change. Vascular: No hyperdense vessel or unexpected calcification. Skull: Normal. Negative for fracture or focal lesion. Sinuses/Orbits: Globes and orbits are unremarkable. Mucosal thickening and inspissated secretions mostly opacify the right maxillary sinus. This is chronic associated with wall thickening. Remaining sinuses are clear. Clear mastoid air cells. Other: None. CT CERVICAL SPINE FINDINGS Alignment: Normal. Skull base and vertebrae: No acute fracture. No primary bone lesion or focal pathologic process. Soft tissues and spinal canal: No prevertebral fluid or swelling. No visible canal hematoma. Disc levels: Moderate to marked loss of disc height at C3-C4. Mild loss of disc height at C3-C4 and C6-C7. Moderate loss of disc height at C5-C6. Spondylotic disc bulging is noted at these levels with uncovertebral spurring. No convincing disc herniation. Upper chest: No acute findings.  Clear lung apices. Other: None. IMPRESSION: HEAD CT 1. No acute intracranial abnormalities. 2. Moderate chronic microvascular ischemic change. 3. Chronic right maxillary sinus disease. CERVICAL CT 1. No fracture or acute finding. Electronically Signed   By: Amie Portland M.D.   On: 08/15/2018 11:53   Dg Hip Unilat With Pelvis 2-3 Views Right  Result Date: 08/15/2018 CLINICAL DATA:  Fall out of bed yesterday. Right hip pain. Initial encounter. EXAM: DG HIP (WITH OR WITHOUT PELVIS) 2-3V RIGHT COMPARISON:  None. FINDINGS: There is no evidence of hip fracture or dislocation. There is no evidence of arthropathy. Old fracture deformity of the right femoral diaphysis noted. Generalized osteopenia and lower lumbar spine degenerative changes are also seen. IMPRESSION: No acute findings. Electronically Signed   By: Danae Orleans M.D.   On: 08/15/2018 14:01   Vas [place] Lower Extremity Venous (  dvt) (mc And Wl 7a-7p)  Result  Date: 08/15/2018  Lower Venous Study Indications: Edema.  Comparison Study: Prior negative LLEV available for comparison from 08/23/2013 Performing Technologist: Sherren Kernsandace Kanady RVS  Examination Guidelines: A complete evaluation includes B-mode imaging, spectral Doppler, color Doppler, and power Doppler as needed of all accessible portions of each vessel. Bilateral testing is considered an integral part of a complete examination. Limited examinations for reoccurring indications may be performed as noted.  +-----+---------------+---------+-----------+----------+-------+  RIGHT Compressibility Phasicity Spontaneity Properties Summary  +-----+---------------+---------+-----------+----------+-------+  CFV   Full            Yes       Yes                             +-----+---------------+---------+-----------+----------+-------+   +---------+---------------+---------+-----------+----------+-------+  LEFT      Compressibility Phasicity Spontaneity Properties Summary  +---------+---------------+---------+-----------+----------+-------+  CFV       Full            Yes       Yes                             +---------+---------------+---------+-----------+----------+-------+  SFJ       Full                                                      +---------+---------------+---------+-----------+----------+-------+  FV Prox   Full                                                      +---------+---------------+---------+-----------+----------+-------+  FV Mid    Full                                                      +---------+---------------+---------+-----------+----------+-------+  FV Distal Full                                                      +---------+---------------+---------+-----------+----------+-------+  PFV       Full                                                      +---------+---------------+---------+-----------+----------+-------+  POP       Full            Yes       Yes                              +---------+---------------+---------+-----------+----------+-------+  PTV       Full                                                      +---------+---------------+---------+-----------+----------+-------+  PERO      Full                                                      +---------+---------------+---------+-----------+----------+-------+     Summary: Right: No evidence of common femoral vein obstruction. Left: There is no evidence of deep vein thrombosis in the lower extremity.  *See table(s) above for measurements and observations.    Preliminary     Procedures Procedures (including critical care time)  Medications Ordered in ED Medications - No data to display   Initial Impression / Assessment and Plan / ED Course  I have reviewed the triage vital signs and the nursing notes.  Pertinent labs & imaging results that were available during my care of the patient were reviewed by me and considered in my medical decision making (see chart for details).       Patient is clinically well in appearance.  Diagnostic work-up is negative.  No ET present with left lower extremity swelling.  Leg is not erythematous to suggest cellulitis.  Swelling is mild to moderate.  Recommend elevating and continued follow-up with PCP.  Patient otherwise is alert and vital signs stable.  Will continue home care.  She does have her daughter is assisting her.  Recommendation is for close follow-up with PCP to continue addressing problems with hallucinations and increasing falls in the home.  Final Clinical Impressions(s) / ED Diagnoses   Final diagnoses:  Fall, initial encounter  Hallucinations  Chest wall pain  Edema of left lower extremity    ED Discharge Orders    None       Arby Barrette, MD 08/15/18 1439

## 2018-08-15 NOTE — ED Notes (Signed)
Pt returned to room from Xray. 

## 2018-11-11 ENCOUNTER — Encounter: Payer: Medicare Other | Attending: Psychology | Admitting: Psychology

## 2018-12-29 ENCOUNTER — Other Ambulatory Visit: Payer: Self-pay

## 2018-12-29 ENCOUNTER — Encounter (HOSPITAL_COMMUNITY): Payer: Self-pay

## 2018-12-29 ENCOUNTER — Ambulatory Visit (HOSPITAL_COMMUNITY)
Admission: EM | Admit: 2018-12-29 | Discharge: 2018-12-29 | Disposition: A | Discharge status: Home / Self Care | Payer: Medicare Other | Attending: Family Medicine | Admitting: Family Medicine

## 2018-12-29 DIAGNOSIS — I10 Essential (primary) hypertension: Secondary | ICD-10-CM | POA: Diagnosis not present

## 2018-12-29 DIAGNOSIS — Z7983 Long term (current) use of bisphosphonates: Secondary | ICD-10-CM | POA: Diagnosis not present

## 2018-12-29 DIAGNOSIS — Z8249 Family history of ischemic heart disease and other diseases of the circulatory system: Secondary | ICD-10-CM | POA: Diagnosis not present

## 2018-12-29 DIAGNOSIS — M25511 Pain in right shoulder: Secondary | ICD-10-CM | POA: Diagnosis present

## 2018-12-29 DIAGNOSIS — Z79899 Other long term (current) drug therapy: Secondary | ICD-10-CM | POA: Diagnosis not present

## 2018-12-29 DIAGNOSIS — K219 Gastro-esophageal reflux disease without esophagitis: Secondary | ICD-10-CM | POA: Insufficient documentation

## 2018-12-29 DIAGNOSIS — R319 Hematuria, unspecified: Secondary | ICD-10-CM

## 2018-12-29 DIAGNOSIS — R609 Edema, unspecified: Secondary | ICD-10-CM

## 2018-12-29 DIAGNOSIS — R600 Localized edema: Secondary | ICD-10-CM | POA: Diagnosis not present

## 2018-12-29 LAB — BASIC METABOLIC PANEL
Anion gap: 9 (ref 5–15)
BUN: 13 mg/dL (ref 8–23)
CO2: 28 mmol/L (ref 22–32)
Calcium: 9.5 mg/dL (ref 8.9–10.3)
Chloride: 104 mmol/L (ref 98–111)
Creatinine, Ser: 0.77 mg/dL (ref 0.44–1.00)
GFR calc Af Amer: >60 mL/min (ref >60)
GFR calc non Af Amer: >60 mL/min (ref >60)
Glucose, Bld: 86 mg/dL (ref 70–99)
Potassium: 3.9 mmol/L (ref 3.5–5.1)
Sodium: 141 mmol/L (ref 135–145)

## 2018-12-29 LAB — POCT URINALYSIS DIP (DEVICE)
Bilirubin Urine: NEGATIVE
Glucose, UA: NEGATIVE mg/dL
Ketones, ur: NEGATIVE mg/dL
Leukocytes,Ua: NEGATIVE
Nitrite: NEGATIVE
Protein, ur: NEGATIVE mg/dL
Specific Gravity, Urine: 1.02 (ref 1.005–1.030)
Urobilinogen, UA: 0.2 mg/dL (ref 0.0–1.0)
pH: 6.5 (ref 5.0–8.0)

## 2018-12-29 LAB — BRAIN NATRIURETIC PEPTIDE: B Natriuretic Peptide: 261 pg/mL — ABNORMAL HIGH (ref 0.0–100.0)

## 2018-12-29 MED ORDER — ACETAMINOPHEN 325 MG PO TABS: 650.0000 mg | ORAL_TABLET | Freq: Four times a day (QID) | ORAL | 0 refills | Status: DC | PRN

## 2018-12-29 NOTE — ED Triage Notes (Signed)
Pt states having bilateral swelling and pain in legs and feet x 2 weeks. Pt states having right shoulder blade pain x 2 weeks.

## 2018-12-29 NOTE — Discharge Instructions (Signed)
Please take your hydrochlorothiazide daily until your swelling has resolved.  Use compression stockings daily or ACE wrap as we have provided.  Elevate your legs as able.  Drink plenty of water and activity as tolerated.  Tylenol regularly to help with your shoulder pain.  Heat, massage, stretching and light activity with your right arm to help with pain.  Please make an appointment in the next few weeks to follow up with your primary care provider for recheck of your edema and medications.

## 2018-12-29 NOTE — ED Provider Notes (Signed)
[facility]    CSN: [id number] Arrival date & time: 12/29/18  1350      History   Chief Complaint Chief Complaint  Patient presents with  . Leg Swelling  . Foot Swelling  . scapular pain    HPI Theresa Bauer is a 85 y.o. female.   Theresa ShaggyDora I Bring presents with complaints of swelling to lower extremities, L>R, which has been worsening over the past week. Tender to low legs, such as to the ankles. No new numbness or tingling. No cough or shortness of breath . Has had similar in the past and was given a "fluid pill" to take until symptoms improve. She has not taken any of this- appears to be hctz- for her current symptoms. She has compression stockings but was able to fit them over her swollen leg. No history of DVT. History  Of venous insufficiency. Has been sitting more than usual for her, as she hasn't been able to attend her day program due to covid-19. She ambulates with a cane. Per chart review was noted to have LLE edema in the ER in July, with a negative DVT study at that time.   Also complains of right posterior shoulder pain which started approximately 1 week ago. No injury. Touch worsens the pain. No limitation to ROm. She is right handed and ambulates with a can. No numbness tingling or weakness. Denies any previous, hasn't taken any medications for pain.     ROS per HPI, negative if not otherwise mentioned.      Past Medical History:  Diagnosis Date  . Dementia [facility](HCC)    Per pt's daughter  . GERD (gastroesophageal reflux disease)   . Glaucoma   . Hallucination, visual    per pt's daughter  . Hypertension     Patient Active Problem List   Diagnosis Date Noted  . Hallucination, visual 02/14/2017  . Acute psychosis (HCC) 02/14/2017  . Low back pain 11/08/2016  . Osteoporosis, unspecified 09/30/2012  . Branch retinal artery occlusion, left eye 09/30/2012  . HYPERTENSION 04/05/2010  . VENOUS INSUFFICIENCY, CHRONIC 04/05/2010  . GERD 04/05/2010  . CHEST  PAIN 04/05/2010    Past Surgical History:  Procedure Laterality Date  . CATARACT EXTRACTION Left   . GLAUCOMA REPAIR Left   . REPLACEMENT TOTAL KNEE BILATERAL  2007/2008    OB History    Gravida  5   Para  5   Term  5   Preterm  0   AB  0   Living        SAB      TAB      Ectopic      Multiple      Live Births               Home Medications    Prior to Admission medications   Medication Sig Start Date End Date Taking? Authorizing Provider  acetaminophen (TYLENOL) 325 MG tablet Take 2 tablets (650 mg total) by mouth every 6 (six) hours as needed for mild pain or moderate pain. 12/29/18   Georgetta HaberBurky, Natalie B, NP  alendronate (FOSAMAX) 70 MG tablet Take 70 mg by mouth every 7 (seven) days. Take with a full glass of water on an empty stomach on Saturday    [provider]  amLODipine (NORVASC) 5 MG tablet TK 1 T PO QD 08/13/18   [provider]  cyclobenzaprine (FLEXERIL) 5 MG tablet Take 1 tablet (5 mg total) by mouth 3 (  three) times daily as needed. Patient not taking: Reported on 09/08/2017 11/05/16   Charlynne Pander, MD  gabapentin (NEURONTIN) 300 MG capsule Take 1 capsule (300 mg total) by mouth 2 (two) times daily. Patient not taking: Reported on 09/08/2017 11/11/16   Albertine Grates, MD  hydrOXYzine (ATARAX/VISTARIL) 10 MG tablet Take 10 mg by mouth 2 (two) times daily as needed for itching. 08/05/17   [provider]  loratadine (CLARITIN) 10 MG tablet Take 10 mg by mouth daily.    [provider]  metoprolol succinate (TOPROL-XL) 25 MG 24 hr tablet Take 25 mg by mouth daily. 12/10/16   [provider]  Multiple Vitamins-Minerals (MULTIVITAMINS THER. W/MINERALS) TABS tablet Take 1 tablet by mouth daily.    [provider]  oxyCODONE-acetaminophen (PERCOCET) 5-325 MG tablet Take 1 tablet by mouth every 4 (four) hours as needed. 09/08/17   Donnetta Hutching, MD  pantoprazole (PROTONIX) 40 MG tablet Take 1 tablet (40 mg total) by  mouth daily. 10/02/12   Laveda Norman, MD  risperiDONE (RISPERDAL) 0.5 MG tablet Take 1 tablet (0.5 mg total) by mouth 2 (two) times daily. Patient not taking: Reported on 09/08/2017 02/15/17   Charm Rings, NP  senna-docusate (SENOKOT-S) 8.6-50 MG tablet Take 1 tablet by mouth at bedtime. 11/11/16   Albertine Grates, MD  timolol (TIMOPTIC) 0.25 % ophthalmic solution Place 1 drop into both eyes daily. 08/27/17   [provider]  traMADol (ULTRAM) 50 MG tablet Take 1 tablet (50 mg total) by mouth every 6 (six) hours as needed. Patient not taking: Reported on 09/08/2017 11/05/16   Charlynne Pander, MD  Travoprost, BAK Free, (TRAVATAN) 0.004 % SOLN ophthalmic solution Place 1 drop into both eyes at bedtime.    [provider]    Family History Family History  Problem Relation Age of Onset  . Heart attack Mother   . Heart attack Father   . Osteoarthritis Father     Social History Social History   Tobacco Use  . Smoking status: Never Smoker  . Smokeless tobacco: Never Used  Substance Use Topics  . Alcohol use: No    Alcohol/week: 0.0 standard drinks  . Drug use: No     Allergies   Lisinopril, Darvocet [propoxyphene n-acetaminophen], Aspirin, Brimonidine, Penicillins, and Darvon [propoxyphene]   Review of Systems Review of Systems   Physical Exam Triage Vital Signs ED Triage Vitals  Enc Vitals Group     BP 12/29/18 1442 (!) 144/60     Pulse Rate 12/29/18 1442 64     Resp 12/29/18 1442 14     Temp 12/29/18 1442 98.1 F (36.7 C)     Temp Source 12/29/18 1442 Oral     SpO2 12/29/18 1442 100 %     Weight --      Height --      Head Circumference --      Peak Flow --      Pain Score 12/29/18 1436 6     Pain Loc --      Pain Edu? --      Excl. in GC? --    No data found.  Updated Vital Signs BP (!) 144/60 (BP Location: Left Arm)   Pulse 64   Temp 98.1 F (36.7 C) (Oral)   Resp 14   SpO2 100%   Visual Acuity Right Eye Distance:   Left Eye Distance:    Bilateral Distance:    Right Eye Near:   Left Eye  Near:    Bilateral Near:     Physical Exam Constitutional:      General: She is not in acute distress.    Appearance: She is well-developed.  Cardiovascular:     Rate and Rhythm: Normal rate and regular rhythm.     Comments: Edema primarily to ankles but extends to lower legs bilaterally; no redness or warmth  Pulmonary:     Effort: Pulmonary effort is normal.     Breath sounds: Normal breath sounds.  Musculoskeletal:     Right shoulder: She exhibits tenderness. She exhibits normal range of motion, no bony tenderness and no swelling.       Arms:     Right lower leg: 1+ Pitting Edema present.     Left lower leg: 2+ Pitting Edema present.     Comments: Right posterior shoulder with tenderness to musculature medial to scapula on palpation; no pain with rom; sensation intact; no bony tenderness   Skin:    General: Skin is warm and dry.  Neurological:     Mental Status: She is alert. Mental status is at baseline.      UC Treatments / Results  Labs (all labs ordered are listed, but only abnormal results are displayed) Labs Reviewed  POCT URINALYSIS DIP (DEVICE) - Abnormal; Notable for the following components:      Result Value   Hgb urine dipstick TRACE (*)    All other components within normal limits  BASIC METABOLIC PANEL  BRAIN NATRIURETIC PEPTIDE    EKG   Radiology No results found.  Procedures Procedures (including critical care time)  Medications Ordered in UC Medications - No data to display  Initial Impression / Assessment and Plan / UC Course  I have reviewed the triage vital signs and the nursing notes.  Pertinent labs & imaging results that were available during my care of the patient were reviewed by me and considered in my medical decision making (see chart for details).     Shoulder musculature with tenderness, strain and/or spasm likely, especially as she weight bears with cane to this  extremity. No red flag findings.   Lower extremity venous insufficiency. Pitting edema without findings or history to suggest dvt. Urine without protein. BNP and bmp pending as well.  Has hctz but doesn't take it regularly. Encouraged use until symptoms have improved. Encouraged compression stockings. Encouraged follow up with PCP for recheck and management. Return precautions provided. Patient and family verbalized understanding and agreeable to plan.   Final Clinical Impressions(s) / UC Diagnoses   Final diagnoses:  Peripheral edema  Acute pain of right shoulder     Discharge Instructions     Please take your hydrochlorothiazide daily until your swelling has resolved.  Use compression stockings daily or ACE wrap as we have provided.  Elevate your legs as able.  Drink plenty of water and activity as tolerated.  Tylenol regularly to help with your shoulder pain.  Heat, massage, stretching and light activity with your right arm to help with pain.  Please make an appointment in the next few weeks to follow up with your primary care provider for recheck of your edema and medications.      ED Prescriptions    Medication Sig Dispense Auth. Provider   acetaminophen (TYLENOL) 325 MG tablet  (Status: Discontinued) Take 2 tablets (650 mg total) by mouth every 6 (six) hours as needed for mild pain or moderate pain. 60 tablet Linus Mako B, NP   acetaminophen (TYLENOL) 325  MG tablet Take 2 tablets (650 mg total) by mouth every 6 (six) hours as needed for mild pain or moderate pain. 60 tablet Zigmund Gottron, NP     PDMP not reviewed this encounter.   Zigmund Gottron, NP 12/29/18 1535

## 2019-01-01 ENCOUNTER — Telehealth (HOSPITAL_COMMUNITY): Payer: Self-pay | Admitting: Emergency Medicine

## 2019-01-01 NOTE — Telephone Encounter (Signed)
Called pt to relay message. Pt will follow up with pcp for recheck of labs. All questions answered.

## 2019-01-21 ENCOUNTER — Other Ambulatory Visit (HOSPITAL_COMMUNITY): Payer: Medicare Other

## 2019-02-21 ENCOUNTER — Other Ambulatory Visit (HOSPITAL_COMMUNITY): Payer: Self-pay | Admitting: Internal Medicine

## 2019-02-21 ENCOUNTER — Ambulatory Visit (HOSPITAL_COMMUNITY): Admission: RE | Admit: 2019-02-21 | Payer: Medicare Other | Source: Ambulatory Visit

## 2019-02-21 DIAGNOSIS — I509 Heart failure, unspecified: Secondary | ICD-10-CM

## 2019-03-14 ENCOUNTER — Other Ambulatory Visit (HOSPITAL_COMMUNITY): Payer: Medicare Other

## 2019-03-15 ENCOUNTER — Ambulatory Visit (HOSPITAL_COMMUNITY)
Admission: RE | Admit: 2019-03-15 | Discharge: 2019-03-15 | Disposition: A | Discharge status: Home / Self Care | Payer: Medicare Other | Source: Ambulatory Visit | Attending: Internal Medicine | Admitting: Internal Medicine

## 2019-03-15 ENCOUNTER — Other Ambulatory Visit: Payer: Self-pay

## 2019-03-15 DIAGNOSIS — I509 Heart failure, unspecified: Secondary | ICD-10-CM | POA: Insufficient documentation

## 2019-03-15 DIAGNOSIS — I358 Other nonrheumatic aortic valve disorders: Secondary | ICD-10-CM | POA: Diagnosis not present

## 2019-03-15 NOTE — Progress Notes (Signed)
  Echocardiogram 2D Echocardiogram has been performed.  Delcie Roch 03/15/2019, 3:09 PM

## 2019-04-14 ENCOUNTER — Ambulatory Visit (INDEPENDENT_AMBULATORY_CARE_PROVIDER_SITE_OTHER): Payer: Medicare Other | Admitting: Podiatry

## 2019-04-14 ENCOUNTER — Other Ambulatory Visit: Payer: Self-pay

## 2019-04-14 ENCOUNTER — Encounter: Payer: Self-pay | Admitting: Podiatry

## 2019-04-14 VITALS — BP 143/76 | HR 85 | Resp 16

## 2019-04-14 DIAGNOSIS — M79676 Pain in unspecified toe(s): Secondary | ICD-10-CM

## 2019-04-14 DIAGNOSIS — B351 Tinea unguium: Secondary | ICD-10-CM | POA: Diagnosis not present

## 2019-04-14 NOTE — Progress Notes (Signed)
Subjective:  Patient ID: Theresa Bauer, female    DOB: 12-30-1929,  MRN: [id number] HPI Chief Complaint  Patient presents with  . Debridement    Requesting nail care for ingrown toenails and daughter concerned about scaling skin dorsal forefoot  . New Patient (Initial Visit)    86 y.o. female presents with the above complaint.   ROS: Denies fever chills nausea vomiting muscle aches pains calf pain back pain chest pain shortness of breath.  Past Medical History:  Diagnosis Date  . Dementia [facility])    Per pt's daughter  . GERD (gastroesophageal reflux disease)   . Glaucoma   . Hallucination, visual    per pt's daughter  . Hypertension    Past Surgical History:  Procedure Laterality Date  . CATARACT EXTRACTION Left   . GLAUCOMA REPAIR Left   . REPLACEMENT TOTAL KNEE BILATERAL  2007/2008    Current Outpatient Medications:  .  acetaminophen (TYLENOL) 325 MG tablet, Take 2 tablets (650 mg total) by mouth every 6 (six) hours as needed for mild pain or moderate pain., Disp: 60 tablet, Rfl: 0 .  alendronate (FOSAMAX) 70 MG tablet, Take 70 mg by mouth every 7 (seven) days. Take with a full glass of water on an empty stomach on Saturday, Disp: , Rfl:  .  amLODipine (NORVASC) 5 MG tablet, TK 1 T PO QD, Disp: , Rfl:  .  cyclobenzaprine (FLEXERIL) 5 MG tablet, Take 1 tablet (5 mg total) by mouth 3 (three) times daily as needed. (Patient not taking: Reported on 09/08/2017), Disp: 15 tablet, Rfl: 0 .  dorzolamide-timolol (COSOPT) 22.3-6.8 MG/ML ophthalmic solution, 1 drop 2 (two) times daily., Disp: , Rfl:  .  furosemide (LASIX) 20 MG tablet, Take 20 mg by mouth daily., Disp: , Rfl:  .  gabapentin (NEURONTIN) 300 MG capsule, Take 1 capsule (300 mg total) by mouth 2 (two) times daily. (Patient not taking: Reported on 09/08/2017), Disp: 60 capsule, Rfl: 0 .  hydrOXYzine (ATARAX/VISTARIL) 10 MG tablet, Take 10 mg by mouth 2 (two) times daily as needed for itching., Disp: , Rfl: 2 .  latanoprost  (XALATAN) 0.005 % ophthalmic solution, 1 drop daily., Disp: , Rfl:  .  loratadine (CLARITIN) 10 MG tablet, Take 10 mg by mouth daily., Disp: , Rfl:  .  metoprolol succinate (TOPROL-XL) 25 MG 24 hr tablet, Take 25 mg by mouth daily., Disp: , Rfl: 2 .  Multiple Vitamins-Minerals (MULTIVITAMINS THER. W/MINERALS) TABS tablet, Take 1 tablet by mouth daily., Disp: , Rfl:  .  OLANZapine (ZYPREXA) 5 MG tablet, Take 5 mg by mouth at bedtime., Disp: , Rfl:  .  pantoprazole (PROTONIX) 40 MG tablet, Take 1 tablet (40 mg total) by mouth daily., Disp: 30 tablet, Rfl: 0 .  risperiDONE (RISPERDAL) 0.5 MG tablet, Take 1 tablet (0.5 mg total) by mouth 2 (two) times daily. (Patient not taking: Reported on 09/08/2017), Disp: 60 tablet, Rfl: 0 .  senna-docusate (SENOKOT-S) 8.6-50 MG tablet, Take 1 tablet by mouth at bedtime., Disp: 30 tablet, Rfl: 0 .  timolol (TIMOPTIC) 0.25 % ophthalmic solution, Place 1 drop into both eyes daily., Disp: , Rfl: 10 .  Travoprost, BAK Free, (TRAVATAN) 0.004 % SOLN ophthalmic solution, Place 1 drop into both eyes at bedtime., Disp: , Rfl:  .  traZODone (DESYREL) 100 MG tablet, Take 100 mg by mouth at bedtime., Disp: , Rfl:   Allergies  Allergen Reactions  . Lisinopril Anaphylaxis  . Darvocet [Propoxyphene N-Acetaminophen] Itching  . Aspirin  itching  . Brimonidine Itching  . Penicillins     Itching Has patient had a PCN reaction causing immediate rash, facial/tongue/throat swelling, SOB or lightheadedness with hypotension: no Has patient had a PCN reaction causing severe rash involving mucus membranes or skin necrosis: no Has patient had a PCN reaction that required hospitalization: no Has patient had a PCN reaction occurring within the last 10 years: no If all of the above answers are "NO", then may proceed with Cephalosporin use.   . Darvon [Propoxyphene] Itching   Review of Systems Objective:   Vitals:   04/14/19 1031  BP: (!) 143/76  Pulse: 85  Resp: 16     General: Well developed, nourished, in no acute distress, alert and oriented x3   Dermatological: Skin is warm, dry and supple bilateral. Nails x 10 are well maintained; remaining integument appears unremarkable at this time. There are no open sores, no preulcerative lesions, no rash or signs of infection present.  Toenails are long thick yellow dystrophic clinically mycotic sharply incurvated painful.  Vascular: Dorsalis Pedis artery and Posterior Tibial artery pedal pulses are 2/4 bilateral with immedate capillary fill time. Pedal hair growth present. No varicosities and no lower extremity edema present bilateral.   Neruologic: Grossly intact via light touch bilateral. Vibratory intact via tuning fork bilateral. Protective threshold with Semmes Wienstein monofilament intact to all pedal sites bilateral. Patellar and Achilles deep tendon reflexes 2+ bilateral. No Babinski or clonus noted bilateral.   Musculoskeletal: No gross boney pedal deformities bilateral. No pain, crepitus, or limitation noted with foot and ankle range of motion bilateral. Muscular strength 5/5 in all groups tested bilateral.  Severe hammertoe deformity and digital deformities 2 through 5 bilateral.  Gait: Unassisted, Nonantalgic.    Radiographs:  None taken  Assessment & Plan:   Assessment: Pain in limb secondary to onychomycosis and xerosis.  Plan: Debridement of toenails 1 through 5 bilateral recommended appropriate creams for rehydration.     Sharnay Cashion T. [place], [place]

## 2019-04-18 ENCOUNTER — Ambulatory Visit (INDEPENDENT_AMBULATORY_CARE_PROVIDER_SITE_OTHER)
Admission: EM | Admit: 2019-04-18 | Discharge: 2019-04-18 | Disposition: A | Discharge status: Home / Self Care | Payer: Medicare Other | Source: Home / Self Care | Attending: Internal Medicine | Admitting: Internal Medicine

## 2019-04-18 ENCOUNTER — Other Ambulatory Visit: Payer: Self-pay

## 2019-04-18 ENCOUNTER — Encounter (HOSPITAL_COMMUNITY): Payer: Self-pay

## 2019-04-18 ENCOUNTER — Emergency Department (HOSPITAL_COMMUNITY)
Admission: EM | Admit: 2019-04-18 | Discharge: 2019-04-19 | Disposition: A | Discharge status: Home / Self Care | Payer: Medicare Other | Attending: Emergency Medicine | Admitting: Emergency Medicine

## 2019-04-18 ENCOUNTER — Encounter (HOSPITAL_COMMUNITY): Payer: Self-pay | Admitting: Emergency Medicine

## 2019-04-18 DIAGNOSIS — Z79899 Other long term (current) drug therapy: Secondary | ICD-10-CM | POA: Diagnosis not present

## 2019-04-18 DIAGNOSIS — M79669 Pain in unspecified lower leg: Secondary | ICD-10-CM | POA: Diagnosis present

## 2019-04-18 DIAGNOSIS — R609 Edema, unspecified: Secondary | ICD-10-CM | POA: Insufficient documentation

## 2019-04-18 DIAGNOSIS — I10 Essential (primary) hypertension: Secondary | ICD-10-CM | POA: Diagnosis not present

## 2019-04-18 DIAGNOSIS — M7989 Other specified soft tissue disorders: Secondary | ICD-10-CM

## 2019-04-18 DIAGNOSIS — F0390 Unspecified dementia without behavioral disturbance: Secondary | ICD-10-CM | POA: Diagnosis not present

## 2019-04-18 NOTE — ED Provider Notes (Signed)
[facility]    CSN: [id number] Arrival date & time: 04/18/19  1926      History   Chief Complaint Chief Complaint  Patient presents with  . Hip Pain    HPI Theresa Bauer is a 86 y.o. female is brought to the urgent care on account of bilateral hip pain and left leg swelling of 2 to 3 weeks duration.  Patient symptoms started insidiously and is gotten progressively worse.  She describes left leg swelling and calf pain.  Her mobility is limited because of bilateral hip pain and lower back pain and has recently started walking with a hunched back because of the bilateral hip pain.  She denies any shortness of breath, chest pain or chest pressure.  No hemoptysis.  No fever or chills.  No nausea, vomiting or diarrhea.  No fever or chills.  No recent falls.  HPI  Past Medical History:  Diagnosis Date  . Dementia [facility])    Per pt's daughter  . GERD (gastroesophageal reflux disease)   . Glaucoma   . Hallucination, visual    per pt's daughter  . Hypertension     Patient Active Problem List   Diagnosis Date Noted  . Status post right cataract extraction 04/22/2017  . Primary open angle glaucoma (POAG) of left eye, severe stage 03/03/2017  . Hallucination, visual 02/14/2017  . Acute psychosis (HCC) 02/14/2017  . Low back pain 11/08/2016  . Combined forms of age-related cataract of right eye 05/01/2016  . Osteoporosis, unspecified 09/30/2012  . Branch retinal artery occlusion, left eye 09/30/2012  . HYPERTENSION 04/05/2010  . VENOUS INSUFFICIENCY, CHRONIC 04/05/2010  . GERD 04/05/2010  . CHEST PAIN 04/05/2010    Past Surgical History:  Procedure Laterality Date  . CATARACT EXTRACTION Left   . GLAUCOMA REPAIR Left   . REPLACEMENT TOTAL KNEE BILATERAL  2007/2008    OB History    Gravida  5   Para  5   Term  5   Preterm  0   AB  0   Living        SAB      TAB      Ectopic      Multiple      Live Births               Home Medications     Prior to Admission medications   Medication Sig Start Date End Date Taking? Authorizing Provider  acetaminophen (TYLENOL) 325 MG tablet Take 2 tablets (650 mg total) by mouth every 6 (six) hours as needed for mild pain or moderate pain. 12/29/18  Yes Linus Mako B, NP  alendronate (FOSAMAX) 70 MG tablet Take 70 mg by mouth every 7 (seven) days. Take with a full glass of water on an empty stomach on Saturday   Yes [provider]  amLODipine (NORVASC) 5 MG tablet TK 1 T PO QD 08/13/18  Yes [provider]  dorzolamide-timolol (COSOPT) 22.3-6.8 MG/ML ophthalmic solution 1 drop 2 (two) times daily. 03/11/19  Yes [provider]  furosemide (LASIX) 20 MG tablet Take 20 mg by mouth daily. 04/11/19  Yes [provider]  hydrOXYzine (ATARAX/VISTARIL) 10 MG tablet Take 10 mg by mouth 2 (two) times daily as needed for itching. 08/05/17  Yes [provider]  latanoprost (XALATAN) 0.005 % ophthalmic solution 1 drop daily. 03/11/19  Yes [provider]  loratadine (CLARITIN) 10 MG tablet Take 10 mg by mouth daily.   Yes [provider]  metoprolol succinate (TOPROL-XL) 25 MG 24 hr tablet Take 25 mg by mouth daily. 12/10/16  Yes [provider]  Multiple Vitamins-Minerals (MULTIVITAMINS THER. W/MINERALS) TABS tablet Take 1 tablet by mouth daily.   Yes [provider]  OLANZapine (ZYPREXA) 5 MG tablet Take 5 mg by mouth at bedtime. 04/11/19  Yes [provider]  pantoprazole (PROTONIX) 40 MG tablet Take 1 tablet (40 mg total) by mouth daily. 10/02/12  Yes Oti, Osie Cheeks, MD  risperiDONE (RISPERDAL) 0.5 MG tablet Take 1 tablet (0.5 mg total) by mouth 2 (two) times daily. 02/15/17  Yes Lord, Herminio Heads, NP  senna-docusate (SENOKOT-S) 8.6-50 MG tablet Take 1 tablet by mouth at bedtime. 11/11/16  Yes Albertine Grates, MD  timolol (TIMOPTIC) 0.25 % ophthalmic solution Place 1 drop into both eyes daily. 08/27/17  Yes [provider]   Travoprost, BAK Free, (TRAVATAN) 0.004 % SOLN ophthalmic solution Place 1 drop into both eyes at bedtime.   Yes [provider]  traZODone (DESYREL) 100 MG tablet Take 100 mg by mouth at bedtime. 04/11/19  Yes [provider]  gabapentin (NEURONTIN) 300 MG capsule Take 1 capsule (300 mg total) by mouth 2 (two) times daily. Patient not taking: Reported on 09/08/2017 11/11/16 04/18/19  Albertine Grates, MD    Family History Family History  Problem Relation Age of Onset  . Heart attack Mother   . Heart attack Father   . Osteoarthritis Father     Social History Social History   Tobacco Use  . Smoking status: Never Smoker  . Smokeless tobacco: Never Used  Substance Use Topics  . Alcohol use: No    Alcohol/week: 0.0 standard drinks  . Drug use: No     Allergies   Lisinopril, Darvocet [propoxyphene n-acetaminophen], Aspirin, Brimonidine, Penicillins, and Darvon [propoxyphene]   Review of Systems Review of Systems  Constitutional: Positive for activity change. Negative for diaphoresis, fatigue and fever.  Respiratory: Negative for cough, chest tightness, shortness of breath and wheezing.   Cardiovascular: Negative for chest pain and palpitations.  Gastrointestinal: Negative.   Musculoskeletal: Positive for arthralgias and back pain. Negative for joint swelling.  Skin: Negative.   Neurological: Negative for dizziness, light-headedness, numbness and headaches.     Physical Exam Triage Vital Signs ED Triage Vitals  Enc Vitals Group     BP 04/18/19 2006 (!) 127/47     Pulse Rate 04/18/19 2006 74     Resp 04/18/19 2006 18     Temp 04/18/19 2006 97.9 F (36.6 C)     Temp Source 04/18/19 2006 Oral     SpO2 04/18/19 2006 100 %     Weight 04/18/19 2002 130 lb (59 kg)     Height 04/18/19 2002 5' (1.524 m)     Head Circumference --      Peak Flow --      Pain Score 04/18/19 2002 8     Pain Loc --      Pain Edu? --      Excl. in GC? --    No data found.  Updated  Vital Signs BP (!) 127/47 (BP Location: Right Arm)   Pulse 74   Temp 97.9 F (36.6 C) (Oral)   Resp 18   Ht 5' (1.524 m)   Wt 59 kg   SpO2 100%   BMI 25.39 kg/m   Visual Acuity Right Eye Distance:   Left Eye Distance:   Bilateral Distance:    Right Eye Near:  Left Eye Near:    Bilateral Near:     Physical Exam Vitals and nursing note reviewed.  Constitutional:      General: She is not in acute distress.    Appearance: She is not ill-appearing.  Cardiovascular:     Rate and Rhythm: Normal rate and regular rhythm.     Pulses: Normal pulses.     Heart sounds: Normal heart sounds.  Pulmonary:     Effort: Pulmonary effort is normal. No respiratory distress.     Breath sounds: Normal breath sounds. No rhonchi or rales.  Abdominal:     General: Bowel sounds are normal. There is no distension.     Hernia: No hernia is present.  Musculoskeletal:     Comments: Left leg swelling with tenderness over the calf region.  Left thigh is swollen.  She has pitting edema more pronounced on the left foot.  Skin:    General: Skin is warm.     Capillary Refill: Capillary refill takes less than 2 seconds.  Neurological:     General: No focal deficit present.     Mental Status: She is alert.      UC Treatments / Results  Labs (all labs ordered are listed, but only abnormal results are displayed) Labs Reviewed - No data to display  EKG   Radiology No results found.  Procedures Procedures (including critical care time)  Medications Ordered in UC Medications - No data to display  Initial Impression / Assessment and Plan / UC Course  I have reviewed the triage vital signs and the nursing notes.  Pertinent labs & imaging results that were available during my care of the patient were reviewed by me and considered in my medical decision making (see chart for details).     1.  Left leg swelling: Patient has no pulmonary symptoms. Given the fact that she has had decreased  mobility over the past few weeks with increasing left cough pain and tenderness, she will need further evaluation for deep vein thrombosis.  We do not have resources for venous Dopplers and hence the condition to visit the emergency department. Final Clinical Impressions(s) / UC Diagnoses   Final diagnoses:  Left leg swelling   Discharge Instructions   None    ED Prescriptions    None     PDMP not reviewed this encounter.   Chase Picket, MD 04/18/19 2030

## 2019-04-18 NOTE — ED Triage Notes (Signed)
Pt c/o 8/10 bilateral leg pain and swollen for the past weeks getting worse tonight and lower back pain. Pt denies any fall or injury.

## 2019-04-18 NOTE — ED Triage Notes (Signed)
Patient complains of bilateral hip pain that started around 2-3 weeks ago, reports that she has been unable to sleep due to the pain.

## 2019-04-19 ENCOUNTER — Emergency Department (HOSPITAL_BASED_OUTPATIENT_CLINIC_OR_DEPARTMENT_OTHER): Payer: Medicare Other

## 2019-04-19 ENCOUNTER — Emergency Department (HOSPITAL_COMMUNITY): Payer: Medicare Other

## 2019-04-19 DIAGNOSIS — R52 Pain, unspecified: Secondary | ICD-10-CM | POA: Diagnosis not present

## 2019-04-19 DIAGNOSIS — R609 Edema, unspecified: Secondary | ICD-10-CM | POA: Diagnosis not present

## 2019-04-19 DIAGNOSIS — M7989 Other specified soft tissue disorders: Secondary | ICD-10-CM | POA: Diagnosis not present

## 2019-04-19 MED ORDER — ACETAMINOPHEN 325 MG PO TABS
650.0000 mg | ORAL_TABLET | Freq: Once | ORAL | Status: AC
  Administered 2019-04-19: 06:00:00 650 mg via ORAL
  Filled 2019-04-19: qty 2

## 2019-04-19 NOTE — Progress Notes (Signed)
Bilateral lower extremity venous duplex exam completed.  Preliminary results can be found under CV proc under chart review.  04/19/2019 9:37 AM  Raynell Upton, K., RDMS, RVT

## 2019-04-19 NOTE — ED Provider Notes (Signed)
11:00 AM Care of the patient assumed at signout.  After obtaining ultrasound results I discussed those with the patient's daughter.  No evidence for DVT bilaterally, we discussed the importance of taking medication at home. Also discussed prior request for home health/social work evaluation.  Initially family requests for this to occur in the hospital, but as the patient has been here for hours, awaiting her evaluation, results, family's preference for follow-up as outpatient.  Absent hemodynamic instability, no indication for admission, patient appropriate for discharge.   Gerhard Munch, MD 04/19/19 1101

## 2019-04-19 NOTE — ED Notes (Signed)
X-ray at bedside

## 2019-04-19 NOTE — ED Provider Notes (Signed)
[facility] EMERGENCY DEPARTMENT Provider Note   CSN: [id number] Arrival date & time: 04/18/19  2037     History Chief Complaint  Patient presents with  . Leg Pain    Theresa Bauer is a 86 y.o. female.  HPI     This is an 86 year old female with a history of hypertension, dementia who presents from urgent care for DVT studies.  Patient's daughter provides most of the history.  Reports over the last several weeks she has had increasing lower extremity edema.  She states that it is impacting her ability to be mobile.  She is still ambulatory but very unsteady and cautious.  Daughter reports that swelling does improve with Lasix but that her mother stopped taking the Lasix.  She is currently prescribed Lasix as needed.  She is not recently taken Lasix.  Daughter reports that she also has difficulty keeping her legs elevated.  Patient also reports bilateral hip pain which impedes her mobility.  She denies any falls or trauma.  She rates her pain at 10 out of 10.  She is not taking anything for the pain.  Denies numbness or tingling of the extremities.  She denies chest pain or shortness of breath.  Urgent care note reviewed.  Patient referred here for DVT studies.  Unfortunately, unable to obtain DVT studies after 7 PM.    Past Medical History:  Diagnosis Date  . Dementia [facility])    Per pt's daughter  . GERD (gastroesophageal reflux disease)   . Glaucoma   . Hallucination, visual    per pt's daughter  . Hypertension     Patient Active Problem List   Diagnosis Date Noted  . Status post right cataract extraction 04/22/2017  . Primary open angle glaucoma (POAG) of left eye, severe stage 03/03/2017  . Hallucination, visual 02/14/2017  . Acute psychosis ([place]) 02/14/2017  . Low back pain 11/08/2016  . Combined forms of age-related cataract of right eye 05/01/2016  . Osteoporosis, unspecified 09/30/2012  . Branch retinal artery occlusion, left eye 09/30/2012  .  HYPERTENSION 04/05/2010  . VENOUS INSUFFICIENCY, CHRONIC 04/05/2010  . GERD 04/05/2010  . CHEST PAIN 04/05/2010    Past Surgical History:  Procedure Laterality Date  . CATARACT EXTRACTION Left   . GLAUCOMA REPAIR Left   . REPLACEMENT TOTAL KNEE BILATERAL  2007/2008     OB History    Gravida  5   Para  5   Term  5   Preterm  0   AB  0   Living        SAB      TAB      Ectopic      Multiple      Live Births              Family History  Problem Relation Age of Onset  . Heart attack Mother   . Heart attack Father   . Osteoarthritis Father     Social History   Tobacco Use  . Smoking status: Never Smoker  . Smokeless tobacco: Never Used  Substance Use Topics  . Alcohol use: No    Alcohol/week: 0.0 standard drinks  . Drug use: No    Home Medications Prior to Admission medications   Medication Sig Start Date End Date Taking? Authorizing Provider  acetaminophen (TYLENOL) 325 MG tablet Take 2 tablets (650 mg total) by mouth every 6 (six) hours as needed for mild pain or moderate pain. 12/29/18  Georgetta Haber, NP  alendronate (FOSAMAX) 70 MG tablet Take 70 mg by mouth every 7 (seven) days. Take with a full glass of water on an empty stomach on Saturday    [provider]  amLODipine (NORVASC) 5 MG tablet TK 1 T PO QD 08/13/18   [provider]  dorzolamide-timolol (COSOPT) 22.3-6.8 MG/ML ophthalmic solution 1 drop 2 (two) times daily. 03/11/19   [provider]  furosemide (LASIX) 20 MG tablet Take 20 mg by mouth daily. 04/11/19   [provider]  hydrOXYzine (ATARAX/VISTARIL) 10 MG tablet Take 10 mg by mouth 2 (two) times daily as needed for itching. 08/05/17   [provider]  latanoprost (XALATAN) 0.005 % ophthalmic solution 1 drop daily. 03/11/19   [provider]  loratadine (CLARITIN) 10 MG tablet Take 10 mg by mouth daily.    [provider]  metoprolol succinate (TOPROL-XL) 25 MG 24 hr tablet  Take 25 mg by mouth daily. 12/10/16   [provider]  Multiple Vitamins-Minerals (MULTIVITAMINS THER. W/MINERALS) TABS tablet Take 1 tablet by mouth daily.    [provider]  OLANZapine (ZYPREXA) 5 MG tablet Take 5 mg by mouth at bedtime. 04/11/19   [provider]  pantoprazole (PROTONIX) 40 MG tablet Take 1 tablet (40 mg total) by mouth daily. 10/02/12   Laveda Norman, MD  risperiDONE (RISPERDAL) 0.5 MG tablet Take 1 tablet (0.5 mg total) by mouth 2 (two) times daily. 02/15/17   Charm Rings, NP  senna-docusate (SENOKOT-S) 8.6-50 MG tablet Take 1 tablet by mouth at bedtime. 11/11/16   Albertine Grates, MD  timolol (TIMOPTIC) 0.25 % ophthalmic solution Place 1 drop into both eyes daily. 08/27/17   [provider]  Travoprost, BAK Free, (TRAVATAN) 0.004 % SOLN ophthalmic solution Place 1 drop into both eyes at bedtime.    [provider]  traZODone (DESYREL) 100 MG tablet Take 100 mg by mouth at bedtime. 04/11/19   [provider]  gabapentin (NEURONTIN) 300 MG capsule Take 1 capsule (300 mg total) by mouth 2 (two) times daily. Patient not taking: Reported on 09/08/2017 11/11/16 04/18/19  Albertine Grates, MD    Allergies    Lisinopril, Darvocet [propoxyphene n-acetaminophen], Aspirin, Brimonidine, Penicillins, and Darvon [propoxyphene]  Review of Systems   Review of Systems  Constitutional: Negative for fever.  Respiratory: Negative for shortness of breath.   Cardiovascular: Positive for leg swelling. Negative for chest pain.  Gastrointestinal: Negative for abdominal pain, nausea and vomiting.  Musculoskeletal:       Bilateral hip pain  All other systems reviewed and are negative.   Physical Exam Updated Vital Signs BP (!) 169/80   Pulse 96   Temp 98.1 F (36.7 C) (Oral)   Resp 20   Ht 1.524 m (5')   Wt 59 kg   SpO2 99%   BMI 25.39 kg/m   Physical Exam Vitals and nursing note reviewed.  Constitutional:      Appearance: She is well-developed.      Comments: Elderly, nontoxic appearing, no acute distress  HENT:     Head: Normocephalic and atraumatic.     Mouth/Throat:     Mouth: Mucous membranes are moist.  Eyes:     Extraocular Movements: Extraocular movements intact.     Pupils: Pupils are equal, round, and reactive to light.  Cardiovascular:     Rate and Rhythm: Normal rate and regular rhythm.     Heart sounds: Normal heart sounds.  Pulmonary:  Effort: Pulmonary effort is normal. No respiratory distress.     Breath sounds: No wheezing.  Abdominal:     Palpations: Abdomen is soft.     Tenderness: There is no abdominal tenderness.  Musculoskeletal:     Cervical back: Neck supple.     Right lower leg: Edema present.     Left lower leg: Edema present.     Comments: 1+ pitting edema bilaterally of the ankles, no tenderness of the calf or overlying skin changes  Skin:    General: Skin is warm and dry.  Neurological:     Mental Status: She is alert and oriented to person, place, and time.  Psychiatric:        Mood and Affect: Mood normal.     ED Results / Procedures / Treatments   Labs (all labs ordered are listed, but only abnormal results are displayed) Labs Reviewed - No data to display  EKG None  Radiology DG Pelvis 1-2 Views  Result Date: 04/19/2019 CLINICAL DATA:  Bilateral hip pain and swelling, worsening tonight with lower back pain, no known injury EXAM: PELVIS - 1-2 VIEW COMPARISON:  Sacrococcygeal radiographs 09/08/2017 CT renal colic 11/05/2016 FINDINGS: Bones of the pelvis are intact and congruent. Femoral heads are normally located. Proximal femora are intact. Multilevel degenerative changes are present in the imaged portions of the spine. Features most pronounced at the lumbosacral junction. Additional degenerative change noted at both SI joints. Mild bilateral hip arthrosis, right slightly greater than left. There are scattered injection granulomata noted in the posterior soft tissues. Mild edematous  changes of hips. Bowel gas pattern is normal. IMPRESSION: No acute fracture or dislocation. Mild bilateral hip arthrosis, right slightly greater than left. Additional degenerative changes in the spine and SI joints. Electronically Signed   By: Kreg Shropshire M.D.   On: 04/19/2019 05:22    Procedures Procedures (including critical care time)  Medications Ordered in ED Medications  acetaminophen (TYLENOL) tablet 650 mg (650 mg Oral Given 04/19/19 0536)    ED Course  I have reviewed the triage vital signs and the nursing notes.  Pertinent labs & imaging results that were available during my care of the patient were reviewed by me and considered in my medical decision making (see chart for details).    MDM Rules/Calculators/A&P                       Patient presents with peripheral edema.  Does respond to Lasix but has not taken Lasix recently.  She is overall nontoxic and vital signs notable for blood pressure 169/80.  She does have some lower extremity edema but not overtly volume overloaded.  She is not hypoxic and no symptoms of chest pain or shortness of breath.  She does have baseline reports of bilateral hip pain.  She is not had any imaging.  I discussed with the patient and her daughter that we are unable to obtain ultrasound at this time.  However, given her extended wait time in the waiting room and arrival of ultrasound later this morning, will have her stay for this imaging.  However, I have fairly low suspicion for DVT and suspect peripheral edema given prior response to Lasix.  Patient was given Tylenol for her hip pain.  Pelvic x-ray show no acute fracture.  She has bilateral hip arthrosis and degenerative changes.  Final Clinical Impression(s) / ED Diagnoses Final diagnoses:  Peripheral edema    Rx / DC Orders ED Discharge  Orders    None       Shon Baton, MD 04/19/19 [phone]

## 2019-04-19 NOTE — ED Notes (Signed)
Tylenol given per MAR. Name/DOB Verified with pt

## 2019-04-19 NOTE — Discharge Instructions (Addendum)
As discussed, today's evaluation has been generally reassuring.  It is very important that you take all medication as prescribed, specifically your medication to assist with removing fluid from your legs.  Similarly important is that you follow-up with your physician.  Please call today for an appointment later this week.  Return here for concerning changes in your condition.  A home health referral has been placed on your behalf.  In the coming days you can expect a visit from either our social work or nursing colleagues for an assessment to see what services will be beneficial to you.

## 2019-04-19 NOTE — ED Notes (Addendum)
Pt wheeled to ED Rm 28 from WR. Pt is alert, speaking in full sentences. Breathing easy, non-labored. Pt reports BLE swelling and pain. States she was told to come to ED by urgent care to have doppler study done. Pt reports she does not take her diuretic pills as prescribed. +pitting edema to BLE. CMS  Intact. Denies CP/SOB

## 2019-05-10 ENCOUNTER — Other Ambulatory Visit: Payer: Self-pay

## 2019-05-10 ENCOUNTER — Ambulatory Visit (INDEPENDENT_AMBULATORY_CARE_PROVIDER_SITE_OTHER): Payer: Medicare Other | Admitting: Podiatry

## 2019-05-10 DIAGNOSIS — L600 Ingrowing nail: Secondary | ICD-10-CM | POA: Diagnosis not present

## 2019-05-10 DIAGNOSIS — L853 Xerosis cutis: Secondary | ICD-10-CM

## 2019-05-10 NOTE — Patient Instructions (Addendum)
Please get a pair of heel protectors. Ms. Dorning is to wear in bed daily.   DO NOT WALK WITH HEEL PROTECTORS ON.  Pressure Injury  A pressure injury is damage to the skin and underlying tissue that results from pressure being applied to an area of the body. It often affects people who must spend a long time in a bed or chair because of a medical condition. Pressure injuries usually occur:  Over bony parts of the body, such as the tailbone, shoulders, elbows, hips, heels, spine, ankles, and back of the head.  Under medical devices that make contact with the body, such as respiratory equipment, stockings, tubes, and splints. Pressure injuries start as reddened areas on the skin and can lead to pain and an open wound. What are the causes? This condition is caused by frequent or constant pressure to an area of the body. Decreased blood flow to the skin can eventually cause the skin tissue to die and break down, causing a wound. What increases the risk? You are more likely to develop this condition if you:  Are in the hospital or an extended care facility.  Are bedridden or in a wheelchair.  Have an injury or disease that keeps you from: ? Moving normally. ? Feeling pain or pressure.  Have a condition that: ? Makes you sleepy or less alert. ? Causes poor blood flow.  Need to wear a medical device.  Have poor control of your bladder or bowel functions (incontinence).  Have poor nutrition (malnutrition). If you are at risk for pressure injuries, your health care provider may recommend certain types of mattresses, mattress covers, pillows, cushions, or boots to help prevent them. These may include products filled with air, foam, gel, or sand. What are the signs or symptoms? Symptoms of this condition depend on the severity of the injury. Symptoms may include:  Red or dark areas of the skin.  Pain, warmth, or a change of skin texture.  Blisters.  An open wound. How is this  diagnosed? This condition is diagnosed with a medical history and physical exam. You may also have tests, such as:  Blood tests.  Imaging tests.  Blood flow tests. Your pressure injury will be staged based on its severity. Staging is based on:  The depth of the tissue injury, including whether there is exposure of muscle, bone, or tendon.  The cause of the pressure injury. How is this treated? This condition may be treated by:  Relieving or redistributing pressure on your skin. This includes: ? Frequently changing your position. ? Avoiding positions that caused the wound or that can make the wound worse. ? Using specific bed mattresses, chair cushions, or protective boots. ? Moving medical devices from an area of pressure, or placing padding between the skin and the device. ? Using foams, creams, or powders to prevent rubbing (friction) on the skin.  Keeping your skin clean and dry. This may include using a skin cleanser or skin barrier as told by your health care provider.  Cleaning your injury and removing any dead tissue from the wound (debridement).  Placing a bandage (dressing) over your injury.  Using medicines for pain or to prevent or treat infection. Surgery may be needed if other treatments are not working or if your injury is very deep. Follow these instructions at home: Wound care  Follow instructions from your health care provider about how to take care of your wound. Make sure you: ? Wash your hands with soap and  water before and after you change your bandage (dressing). If soap and water are not available, use hand sanitizer. ? Change your dressing as told by your health care provider.  Check your wound every day for signs of infection. Have a caregiver do this for you if you are not able. Check for: ? Redness, swelling, or increased pain. ? More fluid or blood. ? Warmth. ? Pus or a bad smell. Skin care  Keep your skin clean and dry. Gently pat your skin  dry.  Do not rub or massage your skin.  You or a caregiver should check your skin every day for any changes in color or any new blisters or sores (ulcers). Medicines  Take over-the-counter and prescription medicines only as told by your health care provider.  If you were prescribed an antibiotic medicine, take or apply it as told by your health care provider. Do not stop using the antibiotic even if your condition improves. Reducing and redistributing pressure  Do not lie or sit in one position for a long time. Move or change position every 1-2 hours, or as told by your health care provider.  Use pillows or cushions to reduce pressure. Ask your health care provider to recommend cushions or pads for you. General instructions   Eat a healthy diet that includes lots of protein.  Drink enough fluid to keep your urine pale yellow.  Be as active as you can every day. Ask your health care provider to suggest safe exercises or activities.  Do not abuse drugs or alcohol.  Do not use any products that contain nicotine or tobacco, such as cigarettes, e-cigarettes, and chewing tobacco. If you need help quitting, ask your health care provider.  Keep all follow-up visits as told by your health care provider. This is important. Contact a health care provider if:  You have: ? A fever or chills. ? Pain that is not helped by medicine. ? Any changes in skin color. ? New blisters or sores. ? Pus or a bad smell coming from your wound. ? Redness, swelling, or pain around your wound. ? More fluid or blood coming from your wound.  Your wound does not improve after 1-2 weeks of treatment. Summary  A pressure injury is damage to the skin and underlying tissue that results from pressure being applied to an area of the body.  Do not lie or sit in one position for a long time. Your health care provider may advise you to move or change position every 1-2 hours.  Follow instructions from your health  care provider about how to take care of your wound.  Keep all follow-up visits as told by your health care provider. This is important. This information is not intended to replace advice given to you by your health care provider. Make sure you discuss any questions you have with your health care provider. Document Revised: 08/19/2017 Document Reviewed: 08/19/2017 Elsevier Patient Education  [place]? An infection that lies within the keratin of your nail plate that is caused by a fungus.  WHY ME? Fungal infections affect all ages, sexes, races, and creeds.  There may be many factors that predispose you to a fungal infection such as age, coexisting medical conditions such as diabetes, or an autoimmune disease; stress, medications, fatigue, genetics, etc.  Bottom line: fungus thrives in a warm, moist environment and your shoes offer such a location.  IS IT CONTAGIOUS? Theoretically, yes.  You  do not want to share shoes, nail clippers or files with someone who has fungal toenails.  Walking around barefoot in the same room or sleeping in the same bed is unlikely to transfer the organism.  It is important to realize, however, that fungus can spread easily from one nail to the next on the same foot.  HOW DO WE TREAT THIS?  There are several ways to treat this condition.  Treatment may depend on many factors such as age, medications, pregnancy, liver and kidney conditions, etc.  It is best to ask your doctor which options are available to you.  3. No treatment.   Unlike many other medical concerns, you can live with this condition.  However for many people this can be a painful condition and may lead to ingrown toenails or a bacterial infection.  It is recommended that you keep the nails cut short to help reduce the amount of fungal nail. 4. Topical treatment.  These range from herbal remedies to prescription strength nail lacquers.  About 40-50%  effective, topicals require twice daily application for approximately 9 to 12 months or until an entirely new nail has grown out.  The most effective topicals are medical grade medications available through physicians offices. 5. Oral antifungal medications.  With an 80-90% cure rate, the most common oral medication requires 3 to 4 months of therapy and stays in your system for a year as the new nail grows out.  Oral antifungal medications do require blood work to make sure it is a safe drug for you.  A liver function panel will be performed prior to starting the medication and after the first month of treatment.  It is important to have the blood work performed to avoid any harmful side effects.  In general, this medication safe but blood work is required. 6. Laser Therapy.  This treatment is performed by applying a specialized laser to the affected nail plate.  This therapy is noninvasive, fast, and non-painful.  It is not covered by insurance and is therefore, out of pocket.  The results have been very good with a 80-95% cure rate.  The [facility] is the only practice in the area to offer this therapy. 7. Permanent Nail Avulsion.  Removing the entire nail so that a new nail will not grow back.

## 2019-05-16 ENCOUNTER — Encounter: Payer: Self-pay | Admitting: Podiatry

## 2019-05-16 NOTE — Progress Notes (Signed)
Subjective: Theresa Bauer presents today for follow up of with chief concern of painful heels b/l and painful great toes. Both aggravated when wearing enclosed shoe gear. She denies any redness, drainage or swelling. Also denies any fever, chills, night sweats, nausea or vomiting.  She states she continues to use Cerave Cream which was recommended by Dr. Al Corpus.  Allergies  Allergen Reactions  . Lisinopril Anaphylaxis  . Darvocet [Propoxyphene N-Acetaminophen] Itching  . Aspirin     itching  . Brimonidine Itching  . Penicillins     Itching Has patient had a PCN reaction causing immediate rash, facial/tongue/throat swelling, SOB or lightheadedness with hypotension: no Has patient had a PCN reaction causing severe rash involving mucus membranes or skin necrosis: no Has patient had a PCN reaction that required hospitalization: no Has patient had a PCN reaction occurring within the last 10 years: no If all of the above answers are "NO", then may proceed with Cephalosporin use.   . Darvon [Propoxyphene] Itching     Objective: There were no vitals filed for this visit.  Pt 86 y.o. year old AA female  in NAD. AAO x 3.   Vascular Examination:  Capillary refill time to digits immediate b/l. Palpable DP pulses b/l. Palpable PT pulses b/l. Pedal hair present b/l. Skin temperature gradient within normal limits b/l.  Dermatological Examination: Pedal skin with normal turgor, texture and tone bilaterally. No open wounds bilaterally. No interdigital macerations bilaterally. Pedal skin noted to be dry and flaky b/l heels.   Incurvated nailplate b/l great toes with tenderness to palpation. No erythema, no edema, no drainage noted.  Musculoskeletal: Normal muscle strength 5/5 to all lower extremity muscle groups bilaterally, no pain crepitus or joint limitation noted with ROM b/l and hammertoes noted to the  2-5 bilaterally.  Neurological: Protective sensation intact 5/5 intact bilaterally with  10g monofilament b/l Vibratory sensation intact b/l Babinski reflex negative b/l Achilles reflex 2+ b/l Clonus negative b/l.  Assessment: 1. Ingrown toenail without infection   2. Xerosis cutis    Plan: -Offending nail borders debrided and curretaged b/l great toes. Borders cleansed with alcohol. Antibiotic ointment applied. She was advised to apply antibiotic ointment to b/l great toes once daily. -Continue Cerave Cream to both feet once daily. -Patient to continue soft, supportive shoe gear daily. -Patient to report any pedal injuries to medical professional immediately. -Patient/POA to call should there be question/concern in the interim.  Return in about 5 weeks (around 06/14/2019) for nail and callus trim.

## 2019-06-15 ENCOUNTER — Ambulatory Visit (INDEPENDENT_AMBULATORY_CARE_PROVIDER_SITE_OTHER): Payer: Medicare Other | Admitting: Podiatry

## 2019-06-15 ENCOUNTER — Other Ambulatory Visit: Payer: Self-pay

## 2019-06-15 ENCOUNTER — Encounter: Payer: Self-pay | Admitting: Podiatry

## 2019-06-15 DIAGNOSIS — M79676 Pain in unspecified toe(s): Secondary | ICD-10-CM | POA: Diagnosis not present

## 2019-06-15 DIAGNOSIS — B351 Tinea unguium: Secondary | ICD-10-CM

## 2019-06-15 NOTE — Patient Instructions (Addendum)
Continue wearing heel pillow at night   Corns and Calluses Corns are small areas of thickened skin that occur on the top, sides, or tip of a toe. They contain a cone-shaped core with a point that can press on a nerve below. This causes pain.  Calluses are areas of thickened skin that can occur anywhere on the body, including the hands, fingers, palms, soles of the feet, and heels. Calluses are usually larger than corns. What are the causes? Corns and calluses are caused by rubbing (friction) or pressure, such as from shoes that are too tight or do not fit properly. What increases the risk? Corns are more likely to develop in people who have misshapen toes (toe deformities), such as hammer toes. Calluses can occur with friction to any area of the skin. They are more likely to develop in people who:  Work with their hands.  Wear shoes that fit poorly, are too tight, or are high-heeled.  Have toe deformities. What are the signs or symptoms? Symptoms of a corn or callus include:  A hard growth on the skin.  Pain or tenderness under the skin.  Redness and swelling.  Increased discomfort while wearing tight-fitting shoes, if your feet are affected. If a corn or callus becomes infected, symptoms may include:  Redness and swelling that gets worse.  Pain.  Fluid, blood, or pus draining from the corn or callus. How is this diagnosed? Corns and calluses may be diagnosed based on your symptoms, your medical history, and a physical exam. How is this treated? Treatment for corns and calluses may include:  Removing the cause of the friction or pressure. This may involve: ? Changing your shoes. ? Wearing shoe inserts (orthotics) or other protective layers in your shoes, such as a corn pad. ? Wearing gloves.  Applying medicine to the skin (topical medicine) to help soften skin in the hardened, thickened areas.  Removing layers of dead skin with a file to reduce the size of the corn or  callus.  Removing the corn or callus with a scalpel or laser.  Taking antibiotic medicines, if your corn or callus is infected.  Having surgery, if a toe deformity is the cause. Follow these instructions at home:   Take over-the-counter and prescription medicines only as told by your health care provider.  If you were prescribed an antibiotic, take it as told by your health care provider. Do not stop taking it even if your condition starts to improve.  Wear shoes that fit well. Avoid wearing high-heeled shoes and shoes that are too tight or too loose.  Wear any padding, protective layers, gloves, or orthotics as told by your health care provider.  Soak your hands or feet and then use a file or pumice stone to soften your corn or callus. Do this as told by your health care provider.  Check your corn or callus every day for symptoms of infection. Contact a health care provider if you:  Notice that your symptoms do not improve with treatment.  Have redness or swelling that gets worse.  Notice that your corn or callus becomes painful.  Have fluid, blood, or pus coming from your corn or callus.  Have new symptoms. Summary  Corns are small areas of thickened skin that occur on the top, sides, or tip of a toe.  Calluses are areas of thickened skin that can occur anywhere on the body, including the hands, fingers, palms, and soles of the feet. Calluses are usually larger than  corns.  Corns and calluses are caused by rubbing (friction) or pressure, such as from shoes that are too tight or do not fit properly.  Treatment may include wearing any padding, protective layers, gloves, or orthotics as told by your health care provider. This information is not intended to replace advice given to you by your health care provider. Make sure you discuss any questions you have with your health care provider. Document Revised: 05/12/2018 Document Reviewed: 12/03/2016 Elsevier Patient Education   2020 [employer].

## 2019-06-23 NOTE — Progress Notes (Signed)
Subjective: Theresa Bauer presents today painful mycotic nails b/l that are difficult to trim. Pain interferes with ambulation. Aggravating factors include wearing enclosed shoe gear. Pain is relieved with periodic professional debridement.  She has been applying Cerave to her heels as recommended by Dr. Milinda Pointer.  Nolene Ebbs, MD is patient's PCP.  Past Medical History:  Diagnosis Date  . Dementia [facility])    Per pt's daughter  . GERD (gastroesophageal reflux disease)   . Glaucoma   . Hallucination, visual    per pt's daughter  . Hypertension      Current Outpatient Medications on File Prior to Visit  Medication Sig Dispense Refill  . acetaminophen (TYLENOL) 325 MG tablet Take 2 tablets (650 mg total) by mouth every 6 (six) hours as needed for mild pain or moderate pain. 60 tablet 0  . alendronate (FOSAMAX) 70 MG tablet Take 70 mg by mouth every 7 (seven) days. Take with a full glass of water on an empty stomach on Saturday    . amLODipine (NORVASC) 5 MG tablet Take 5 mg by mouth daily.     . dorzolamide-timolol (COSOPT) 22.3-6.8 MG/ML ophthalmic solution Place 1 drop into both eyes 2 (two) times daily.     . furosemide (LASIX) 20 MG tablet Take 20 mg by mouth daily.    . hydrOXYzine (ATARAX/VISTARIL) 10 MG tablet Take 10 mg by mouth 2 (two) times daily as needed for itching.  2  . latanoprost (XALATAN) 0.005 % ophthalmic solution 1 drop daily.    Marland Kitchen loratadine (CLARITIN) 10 MG tablet Take 10 mg by mouth daily.    . metoprolol succinate (TOPROL-XL) 25 MG 24 hr tablet Take 25 mg by mouth daily.  2  . Multiple Vitamins-Minerals (MULTIVITAMINS THER. W/MINERALS) TABS tablet Take 1 tablet by mouth daily.    . nitrofurantoin, macrocrystal-monohydrate, (MACROBID) 100 MG capsule Take 100 mg by mouth 2 (two) times daily.    Marland Kitchen OLANZapine (ZYPREXA) 5 MG tablet Take 5 mg by mouth at bedtime.    . pantoprazole (PROTONIX) 40 MG tablet Take 1 tablet (40 mg total) by mouth daily. 30 tablet 0  .  risperiDONE (RISPERDAL) 0.5 MG tablet Take 1 tablet (0.5 mg total) by mouth 2 (two) times daily. 60 tablet 0  . senna-docusate (SENOKOT-S) 8.6-50 MG tablet Take 1 tablet by mouth at bedtime. 30 tablet 0  . Travoprost, BAK Free, (TRAVATAN) 0.004 % SOLN ophthalmic solution Place 1 drop into both eyes at bedtime.    . traZODone (DESYREL) 100 MG tablet Take 100 mg by mouth at bedtime.    . [DISCONTINUED] gabapentin (NEURONTIN) 300 MG capsule Take 1 capsule (300 mg total) by mouth 2 (two) times daily. (Patient not taking: Reported on 09/08/2017) 60 capsule 0   No current facility-administered medications on file prior to visit.     Allergies  Allergen Reactions  . Lisinopril Anaphylaxis  . Darvocet [Propoxyphene N-Acetaminophen] Itching  . Aspirin     itching  . Brimonidine Itching  . Penicillins     Itching Has patient had a PCN reaction causing immediate rash, facial/tongue/throat swelling, SOB or lightheadedness with hypotension: no Has patient had a PCN reaction causing severe rash involving mucus membranes or skin necrosis: no Has patient had a PCN reaction that required hospitalization: no Has patient had a PCN reaction occurring within the last 10 years: no If all of the above answers are "NO", then may proceed with Cephalosporin use.   . Darvon [Propoxyphene] Itching    Objective:  Theresa Bauer is a pleasant 86 y.o. y.o. Patient Race: Black or African American [2]  female in NAD. AAO x 3.  There were no vitals filed for this visit.  Vascular Examination: Capillary refill time to digits immediate b/l. Palpable DP pulses b/l. Palpable PT pulses b/l. Pedal hair present b/l. Skin temperature gradient within normal limits b/l.  Dermatological Examination: Pedal skin with normal turgor, texture and tone bilaterally. No open wounds bilaterally. No interdigital macerations bilaterally. Toenails 1-5 b/l elongated, dystrophic, thickened, crumbly with subungual debris and tenderness to  dorsal palpation.  Incurvated nailplate b/l great toes with tenderness to palpation. No erythema, no edema, no drainage noted.  Musculoskeletal: Normal muscle strength 5/5 to all lower extremity muscle groups bilaterally. No pain crepitus or joint limitation noted with ROM b/l. Hammertoes noted to the 2-5 bilaterally.  Neurological Examination: Protective sensation intact 5/5 intact bilaterally with 10g monofilament b/l. Vibratory sensation intact b/l.  Assessment: 1. Pain due to onychomycosis of toenail   Plan: -Examined patient. -No new findings. No new orders. -Toenails 1-5 b/l were debrided in length and girth with sterile nail nippers and dremel without iatrogenic bleeding. Offending nail border debrided and curretaged left hallux. Border cleansed with alcohol and triple antibiotic applied. No further treatment required by patient/caregiver. -Patient to continue soft, supportive shoe gear daily. -Patient to report any pedal injuries to medical professional immediately. -Patient/POA to call should there be question/concern in the interim.  Return in about 10 weeks (around 08/24/2019) for nail and callus trim.  Freddie Breech, DPM

## 2019-06-27 ENCOUNTER — Ambulatory Visit: Payer: Medicare Other | Admitting: Podiatry

## 2019-07-07 ENCOUNTER — Other Ambulatory Visit: Payer: Self-pay

## 2019-07-07 ENCOUNTER — Emergency Department (HOSPITAL_COMMUNITY)
Admission: EM | Admit: 2019-07-07 | Discharge: 2019-07-11 | Disposition: A | Discharge status: Home / Self Care | Payer: Medicare Other | Attending: Emergency Medicine | Admitting: Emergency Medicine

## 2019-07-07 ENCOUNTER — Emergency Department (HOSPITAL_COMMUNITY): Payer: Medicare Other

## 2019-07-07 DIAGNOSIS — S0990XA Unspecified injury of head, initial encounter: Secondary | ICD-10-CM | POA: Diagnosis present

## 2019-07-07 DIAGNOSIS — Y9301 Activity, walking, marching and hiking: Secondary | ICD-10-CM | POA: Diagnosis not present

## 2019-07-07 DIAGNOSIS — Z20822 Contact with and (suspected) exposure to covid-19: Secondary | ICD-10-CM | POA: Diagnosis not present

## 2019-07-07 DIAGNOSIS — Y998 Other external cause status: Secondary | ICD-10-CM | POA: Insufficient documentation

## 2019-07-07 DIAGNOSIS — Y929 Unspecified place or not applicable: Secondary | ICD-10-CM | POA: Diagnosis not present

## 2019-07-07 DIAGNOSIS — W19XXXA Unspecified fall, initial encounter: Secondary | ICD-10-CM

## 2019-07-07 DIAGNOSIS — S0083XA Contusion of other part of head, initial encounter: Secondary | ICD-10-CM | POA: Diagnosis not present

## 2019-07-07 DIAGNOSIS — M25552 Pain in left hip: Secondary | ICD-10-CM | POA: Insufficient documentation

## 2019-07-07 DIAGNOSIS — Z79899 Other long term (current) drug therapy: Secondary | ICD-10-CM | POA: Insufficient documentation

## 2019-07-07 DIAGNOSIS — W01198A Fall on same level from slipping, tripping and stumbling with subsequent striking against other object, initial encounter: Secondary | ICD-10-CM | POA: Insufficient documentation

## 2019-07-07 DIAGNOSIS — R2243 Localized swelling, mass and lump, lower limb, bilateral: Secondary | ICD-10-CM | POA: Diagnosis not present

## 2019-07-07 DIAGNOSIS — I10 Essential (primary) hypertension: Secondary | ICD-10-CM | POA: Insufficient documentation

## 2019-07-07 DIAGNOSIS — F0390 Unspecified dementia without behavioral disturbance: Secondary | ICD-10-CM | POA: Insufficient documentation

## 2019-07-07 LAB — COMPREHENSIVE METABOLIC PANEL
ALT: 12 U/L (ref 0–44)
AST: 20 U/L (ref 15–41)
Albumin: 4 g/dL (ref 3.5–5.0)
Alkaline Phosphatase: 54 U/L (ref 38–126)
Anion gap: 11 (ref 5–15)
BUN: 12 mg/dL (ref 8–23)
CO2: 26 mmol/L (ref 22–32)
Calcium: 9 mg/dL (ref 8.9–10.3)
Chloride: 103 mmol/L (ref 98–111)
Creatinine, Ser: 0.54 mg/dL (ref 0.44–1.00)
GFR calc Af Amer: >60 mL/min (ref >60)
GFR calc non Af Amer: >60 mL/min (ref >60)
Glucose, Bld: 104 mg/dL — ABNORMAL HIGH (ref 70–99)
Potassium: 3.4 mmol/L — ABNORMAL LOW (ref 3.5–5.1)
Sodium: 140 mmol/L (ref 135–145)
Total Bilirubin: 0.8 mg/dL (ref 0.3–1.2)
Total Protein: 7.2 g/dL (ref 6.5–8.1)

## 2019-07-07 LAB — URINALYSIS, ROUTINE W REFLEX MICROSCOPIC
Bacteria, UA: NONE SEEN
Bilirubin Urine: NEGATIVE
Glucose, UA: NEGATIVE mg/dL
Ketones, ur: 5 mg/dL — AB
Leukocytes,Ua: NEGATIVE
Nitrite: NEGATIVE
Protein, ur: NEGATIVE mg/dL
Specific Gravity, Urine: 1.011 (ref 1.005–1.030)
pH: 7 (ref 5.0–8.0)

## 2019-07-07 LAB — CBC WITH DIFFERENTIAL/PLATELET
Abs Immature Granulocytes: 0.03 10*3/uL (ref 0.00–0.07)
Basophils Absolute: 0 10*3/uL (ref 0.0–0.1)
Basophils Relative: 0 %
Eosinophils Absolute: 0 10*3/uL (ref 0.0–0.5)
Eosinophils Relative: 0 %
HCT: 34.1 % — ABNORMAL LOW (ref 36.0–46.0)
Hemoglobin: 11 g/dL — ABNORMAL LOW (ref 12.0–15.0)
Immature Granulocytes: 1 %
Lymphocytes Relative: 19 %
Lymphs Abs: 1.2 10*3/uL (ref 0.7–4.0)
MCH: 22.5 pg — ABNORMAL LOW (ref 26.0–34.0)
MCHC: 32.3 g/dL (ref 30.0–36.0)
MCV: 69.7 fL — ABNORMAL LOW (ref 80.0–100.0)
Monocytes Absolute: 1 10*3/uL (ref 0.1–1.0)
Monocytes Relative: 15 %
Neutro Abs: 4.1 10*3/uL (ref 1.7–7.7)
Neutrophils Relative %: 65 %
Platelets: 217 10*3/uL (ref 150–400)
RBC: 4.89 MIL/uL (ref 3.87–5.11)
RDW: 17 % — ABNORMAL HIGH (ref 11.5–15.5)
WBC: 6.3 10*3/uL (ref 4.0–10.5)
nRBC: 0 % (ref 0.0–0.2)

## 2019-07-07 LAB — TYPE AND SCREEN
ABO/RH(D): O POS
Antibody Screen: NEGATIVE

## 2019-07-07 LAB — ABO/RH: ABO/RH(D): O POS

## 2019-07-07 LAB — SARS CORONAVIRUS 2 BY RT PCR (HOSPITAL ORDER, PERFORMED IN ~~LOC~~ HOSPITAL LAB): SARS Coronavirus 2: NEGATIVE

## 2019-07-07 LAB — TROPONIN I (HIGH SENSITIVITY): Troponin I (High Sensitivity): 6 ng/L (ref <18)

## 2019-07-07 MED ORDER — PANTOPRAZOLE SODIUM 40 MG PO TBEC
40.0000 mg | DELAYED_RELEASE_TABLET | Freq: Every day | ORAL | Status: DC
  Administered 2019-07-08 – 2019-07-11 (×4): 40 mg via ORAL
  Filled 2019-07-07 (×4): qty 1

## 2019-07-07 MED ORDER — SENNOSIDES-DOCUSATE SODIUM 8.6-50 MG PO TABS
1.0000 | ORAL_TABLET | Freq: Every day | ORAL | Status: DC
  Administered 2019-07-07 – 2019-07-10 (×4): 1 via ORAL
  Filled 2019-07-07 (×4): qty 1

## 2019-07-07 MED ORDER — AMLODIPINE BESYLATE 5 MG PO TABS
5.0000 mg | ORAL_TABLET | Freq: Every day | ORAL | Status: DC
  Administered 2019-07-08 – 2019-07-10 (×3): 5 mg via ORAL
  Filled 2019-07-07 (×4): qty 1

## 2019-07-07 MED ORDER — FUROSEMIDE 40 MG PO TABS
20.0000 mg | ORAL_TABLET | Freq: Every day | ORAL | Status: DC
  Administered 2019-07-08 – 2019-07-10 (×3): 20 mg via ORAL
  Filled 2019-07-07 (×3): qty 1

## 2019-07-07 MED ORDER — FENTANYL CITRATE (PF) 100 MCG/2ML IJ SOLN
50.0000 ug | Freq: Once | INTRAMUSCULAR | Status: AC
  Administered 2019-07-07: 50 ug via INTRAVENOUS
  Filled 2019-07-07: qty 2

## 2019-07-07 MED ORDER — TRAZODONE HCL 100 MG PO TABS
100.0000 mg | ORAL_TABLET | Freq: Every day | ORAL | Status: DC
  Administered 2019-07-07 – 2019-07-10 (×4): 100 mg via ORAL
  Filled 2019-07-07 (×4): qty 1

## 2019-07-07 MED ORDER — ACETAMINOPHEN 500 MG PO TABS
500.0000 mg | ORAL_TABLET | Freq: Two times a day (BID) | ORAL | Status: DC | PRN
  Administered 2019-07-08 – 2019-07-11 (×3): 500 mg via ORAL
  Filled 2019-07-07 (×3): qty 1

## 2019-07-07 MED ORDER — OLANZAPINE 5 MG PO TABS
7.5000 mg | ORAL_TABLET | Freq: Every evening | ORAL | Status: DC
  Administered 2019-07-07 – 2019-07-10 (×4): 7.5 mg via ORAL
  Filled 2019-07-07 (×4): qty 1

## 2019-07-07 NOTE — ED Notes (Signed)
Pt ambulated to door and back using walker an with tech assistance

## 2019-07-07 NOTE — ED Notes (Signed)
Patient transported to X-ray 

## 2019-07-07 NOTE — ED Notes (Signed)
Family at bedside. 

## 2019-07-07 NOTE — TOC Initial Note (Signed)
Transition of Care [facility]) - Initial/Assessment Note    Patient Details  Name: Theresa Bauer MRN: [id number] Date of Birth: Apr 22, 1929  [facility]) CM/SW Contact:    Elliot Cousin, RN Phone Number: [phone] 07/07/2019, 6:33 PM  Clinical Narrative:                  TOC CM spoke to pt's dtr, Marylee Floras. Pt lives at home alone. Uses a walker for ambulation. Gave permission to create FL2 and fax referral to SNF for rehab. Waiting PT evaluation.    Expected Discharge Plan: Skilled Nursing Facility Barriers to Discharge: Continued Medical Work up   Patient Goals and CMS Choice Patient states their goals for this hospitalization and ongoing recovery are:: mother lives alone and will need rehab CMS Medicare.gov Compare Post Acute Care list provided to:: Patient Represenative (must comment)(Daughter Ashok Croon) Choice offered to / list presented to : Adult Children  Expected Discharge Plan and Services Expected Discharge Plan: Skilled Nursing Facility In-house Referral: Clinical Social Work Discharge Planning Services: CM Consult Post Acute Care Choice: Skilled Nursing Facility Living arrangements for the past 2 months: Single Family Home                                      Prior Living Arrangements/Services Living arrangements for the past 2 months: Single Family Home Lives with:: Self Patient language and need for interpreter reviewed:: Yes        Need for Family Participation in Patient Care: Yes (Comment) Care giver support system in place?: Yes (comment) Current home services: DME(rolling walker) Criminal Activity/Legal Involvement Pertinent to Current Situation/Hospitalization: No - Comment as needed  Activities of Daily Living      Permission Sought/Granted Permission sought to share information with : Case Manager, Facility Medical sales representative, PCP, Family Supports Permission granted to share information with : Yes, Verbal Permission  Granted  Share Information with NAME: Donisha Hoch  Permission granted to share info w AGENCY: SNF  Permission granted to share info w Relationship: daughter  Permission granted to share info w Contact Information: [phone]  Emotional Assessment           Psych Involvement: No (comment)  Admission diagnosis:  Fall; Hip Pain Patient Active Problem List   Diagnosis Date Noted  . Status post right cataract extraction 04/22/2017  . Primary open angle glaucoma (POAG) of left eye, severe stage 03/03/2017  . Hallucination, visual 02/14/2017  . Acute psychosis (HCC) 02/14/2017  . Low back pain 11/08/2016  . Combined forms of age-related cataract of right eye 05/01/2016  . Osteoporosis, unspecified 09/30/2012  . Branch retinal artery occlusion, left eye 09/30/2012  . HYPERTENSION 04/05/2010  . VENOUS INSUFFICIENCY, CHRONIC 04/05/2010  . GERD 04/05/2010  . CHEST PAIN 04/05/2010   PCP:  Fleet Contras, MD Pharmacy:   [facility] [phone] - [place], [place] - [address] AT [facility] OF [address] & [address] [facility] [address] Phone: [phone] Fax: [phone]     Social Determinants of Health (SDOH) Interventions    Readmission Risk Interventions No flowsheet data found.

## 2019-07-07 NOTE — ED Provider Notes (Signed)
[facility] DEPT Provider Note   CSN: [id number] Arrival date & time: 07/07/19  1236     History Chief Complaint  Patient presents with   Theresa Bauer is a 86 y.o. female presenting for evaluation of head pain, back pain, and hip pain after fall.  Patient states today she fell at home, but she is not sure what prompted her fall.  She denies feeling dizzy, weak, or having chest pain prior to the fall.  She denies tripping and falling.  She states she fell back, hit the back of her head.  She now has posterior head pain, neck pain, low back pain, bilateral hip pain, worse on the left.  She has not taken anything for this.  She has not been able to ambulate since.  She is not on blood thinners.  Patient states she thinks she may have been having fevers at home, but has not checked.  She denies cough, chest pain, shortness of breath, nausea, vomiting, Donnell pain, urinary symptoms, abnormal bowel movements. Pt lives at home by herself, no HH or aides.   HPI     Past Medical History:  Diagnosis Date   Dementia ([place])    Per pt's daughter   GERD (gastroesophageal reflux disease)    Glaucoma    Hallucination, visual    per pt's daughter   Hypertension     Patient Active Problem List   Diagnosis Date Noted   Status post right cataract extraction 04/22/2017   Primary open angle glaucoma (POAG) of left eye, severe stage 03/03/2017   Hallucination, visual 02/14/2017   Acute psychosis ([place]) 02/14/2017   Low back pain 11/08/2016   Combined forms of age-related cataract of right eye 05/01/2016   Osteoporosis, unspecified 09/30/2012   Branch retinal artery occlusion, left eye 09/30/2012   HYPERTENSION 04/05/2010   VENOUS INSUFFICIENCY, CHRONIC 04/05/2010   GERD 04/05/2010   CHEST PAIN 04/05/2010    Past Surgical History:  Procedure Laterality Date   CATARACT EXTRACTION Left    GLAUCOMA REPAIR Left    REPLACEMENT TOTAL  KNEE BILATERAL  2007/2008     OB History    Gravida  5   Para  5   Term  5   Preterm  0   AB  0   Living        SAB      TAB      Ectopic      Multiple      Live Births              Family History  Problem Relation Age of Onset   Heart attack Mother    Heart attack Father    Osteoarthritis Father     Social History   Tobacco Use   Smoking status: Never Smoker   Smokeless tobacco: Never Used  Substance Use Topics   Alcohol use: No    Alcohol/week: 0.0 standard drinks   Drug use: No    Home Medications Prior to Admission medications   Medication Sig Start Date End Date Taking? Authorizing Provider  acetaminophen (TYLENOL) 500 MG tablet Take 500 mg by mouth every 12 (twelve) hours as needed for mild pain or moderate pain.   Yes [provider]  alendronate (FOSAMAX) 70 MG tablet Take 70 mg by mouth once a week.    Yes [provider]  amLODipine (NORVASC) 5 MG tablet Take 5 mg by mouth daily.  08/13/18  Yes  [provider]  diclofenac Sodium (VOLTAREN) 1 % GEL Apply 2 g topically 4 (four) times daily as needed (pain).   Yes [provider]  dorzolamide-timolol (COSOPT) 22.3-6.8 MG/ML ophthalmic solution Place 1 drop into both eyes 2 (two) times daily.  03/11/19  Yes [provider]  furosemide (LASIX) 20 MG tablet Take 20 mg by mouth daily. 04/11/19  Yes [provider]  latanoprost (XALATAN) 0.005 % ophthalmic solution Place 1 drop into both eyes daily.  05/25/19  Yes [provider]  loratadine (CLARITIN) 10 MG tablet Take 10 mg by mouth daily.   Yes [provider]  Multiple Vitamins-Minerals (CENTRUM SILVER 50+WOMEN PO) Take 1 tablet by mouth daily.   Yes [provider]  OLANZapine (ZYPREXA) 7.5 MG tablet Take 7.5 mg by mouth every evening.   Yes [provider]  pantoprazole (PROTONIX) 40 MG tablet Take 1 tablet (40 mg total) by mouth daily. 10/02/12  Yes Oti,  Osie Cheeks, MD  senna-docusate (SENOKOT-S) 8.6-50 MG tablet Take 1 tablet by mouth at bedtime. Patient taking differently: Take 1 tablet by mouth daily.  11/11/16  Yes Albertine Grates, MD  traZODone (DESYREL) 100 MG tablet Take 100 mg by mouth at bedtime. 04/11/19  Yes [provider]  acetaminophen (TYLENOL) 325 MG tablet Take 2 tablets (650 mg total) by mouth every 6 (six) hours as needed for mild pain or moderate pain. Patient not taking: Reported on 07/07/2019 12/29/18   Linus Mako B, NP  risperiDONE (RISPERDAL) 0.5 MG tablet Take 1 tablet (0.5 mg total) by mouth 2 (two) times daily. Patient not taking: Reported on 07/07/2019 02/15/17   Charm Rings, NP  gabapentin (NEURONTIN) 300 MG capsule Take 1 capsule (300 mg total) by mouth 2 (two) times daily. Patient not taking: Reported on 09/08/2017 11/11/16 04/18/19  Albertine Grates, MD    Allergies    Lisinopril, Darvocet [propoxyphene n-acetaminophen], Aspirin, Brimonidine, Penicillins, and Darvon [propoxyphene]  Review of Systems   Review of Systems  Musculoskeletal: Positive for arthralgias, back pain and neck pain.  Neurological: Positive for headaches.  All other systems reviewed and are negative.   Physical Exam Updated Vital Signs BP (!) 146/71    Pulse 94    Temp 98.3 F (36.8 C) (Oral)    Resp 14    SpO2 97%   Physical Exam Vitals and nursing note reviewed.  Constitutional:      General: She is not in acute distress.    Appearance: She is well-developed.     Comments: Elderly female who appears nontoxic  HENT:     Head: Normocephalic.      Comments: Hematoma to the occiput.  No laceration.  Tenderness palpation of the hematoma.  No injury noted elsewhere.  No hemotympanum or nasal septal hematoma.  No signs of mouth or tongue trauma. Eyes:     Extraocular Movements: Extraocular movements intact.     Conjunctiva/sclera: Conjunctivae normal.     Pupils: Pupils are equal, round, and reactive to light.  Neck:     Comments: Tenderness  to palpation midline C-spine.  no step-offs or deformities.  Moving head without signs of pain Cardiovascular:     Rate and Rhythm: Normal rate and regular rhythm.     Pulses: Normal pulses.  Pulmonary:     Effort: Pulmonary effort is normal. No respiratory distress.     Breath sounds: Normal breath sounds. No wheezing.     Comments: Clear lung sounds in all fields Abdominal:  General: There is no distension.     Palpations: Abdomen is soft. There is no mass.     Tenderness: There is no abdominal tenderness. There is no guarding or rebound.  Musculoskeletal:        General: Tenderness present. Normal range of motion.     Cervical back: Normal range of motion and neck supple. Tenderness present.     Comments: Tenderness palpation of the right lateral hip without obvious deformity.  No shortening or rotation noted on my examination of the legs.  Pedal pulses 2+ bilaterally.  Good distal sensation.  Pain with left hip flexion, no pain with right hip flexion. Patient rolled after CT head and neck were negative.  Tenderness palpation of midline lumbar spine and tenderness palpation of paraspinal muscles.  Skin:    General: Skin is warm and dry.     Capillary Refill: Capillary refill takes less than 2 seconds.  Neurological:     Mental Status: She is alert and oriented to person, place, and time.     ED Results / Procedures / Treatments   Labs (all labs ordered are listed, but only abnormal results are displayed) Labs Reviewed  CBC WITH DIFFERENTIAL/PLATELET - Abnormal; Notable for the following components:      Result Value   Hemoglobin 11.0 (*)    HCT 34.1 (*)    MCV 69.7 (*)    MCH 22.5 (*)    RDW 17.0 (*)    All other components within normal limits  COMPREHENSIVE METABOLIC PANEL - Abnormal; Notable for the following components:   Potassium 3.4 (*)    Glucose, Bld 104 (*)    All other components within normal limits  URINALYSIS, ROUTINE W REFLEX MICROSCOPIC - Abnormal;  Notable for the following components:   Hgb urine dipstick SMALL (*)    Ketones, ur 5 (*)    All other components within normal limits  SARS CORONAVIRUS 2 BY RT PCR (HOSPITAL ORDER, PERFORMED IN [facility])  URINE CULTURE  TYPE AND SCREEN  TROPONIN I (HIGH SENSITIVITY)    EKG EKG Interpretation  Date/Time:  Thursday July 07 2019 12:58:16 EDT Ventricular Rate:  112 PR Interval:    QRS Duration: 89 QT Interval:  330 QTC Calculation: 451 R Axis:   8 Text Interpretation: Sinus tachycardia Consider right atrial enlargement Borderline repolarization abnormality No STEMI Confirmed by Alvester Chou [id number]) on 07/07/2019 1:33:47 PM   Radiology DG Chest 1 View  Result Date: 07/07/2019 CLINICAL DATA:  Fall. EXAM: CHEST  1 VIEW COMPARISON:  Chest x-ray dated August 15, 2018. FINDINGS: Unchanged mild cardiomegaly. Atherosclerotic calcification of the aortic arch. Normal pulmonary vascularity. Chronic scarring/atelectasis in the left lower lobe. No focal consolidation, pleural effusion, or pneumothorax. No acute osseous abnormality. IMPRESSION: No active disease. Electronically Signed   By: Obie Dredge M.D.   On: 07/07/2019 15:24   DG Lumbar Spine Complete  Result Date: 07/07/2019 CLINICAL DATA:  Fall. EXAM: LUMBAR SPINE - COMPLETE 4+ VIEW COMPARISON:  MRI lumbar spine dated November 08, 2016. FINDINGS: Five lumbar type vertebral bodies. There is slight flattening of the L1 superior endplate, new since the prior study. Remaining vertebral body heights are preserved. Unchanged 6 mm anterolisthesis at L4-L5. Progressive moderate disc height loss at L4-L5 and L5-S1. Similar appearing advanced facet arthropathy from L3-L4 through L5-S1. The sacroiliac joints are unremarkable. IMPRESSION: 1. Age indeterminate minimal L1 superior endplate compression fracture, new since 2018. Correlate with point tenderness. 2. Progressive moderate degenerative disc disease  at L4-L5 and L5-S1. 3. Unchanged  advanced lower lumbar facet arthropathy with grade 1 anterolisthesis at L4-L5. Electronically Signed   By: Obie Dredge M.D.   On: 07/07/2019 15:22   CT Head Wo Contrast  Result Date: 07/07/2019 CLINICAL DATA:  Fall EXAM: CT HEAD WITHOUT CONTRAST TECHNIQUE: Contiguous axial images were obtained from the base of the skull through the vertex without intravenous contrast. COMPARISON:  08/15/2018 FINDINGS: Brain: No evidence of acute infarction, hemorrhage, hydrocephalus, extra-axial collection or mass lesion/mass effect. Periventricular and deep white matter hypodensity with mild global cerebral volume loss. Vascular: No hyperdense vessel or unexpected calcification. Skull: Normal. Negative for fracture or focal lesion. Sinuses/Orbits: Chronic opacification of the right maxillary sinus with extensive bony sinus wall thickening. The remaining paranasal sinuses are normally aerated. Other: None. IMPRESSION: 1. No acute intracranial pathology. 2. Small-vessel white matter disease and mild global cerebral volume loss. 3. Chronic right maxillary sinusitis. Electronically Signed   By: Lauralyn Primes M.D.   On: 07/07/2019 14:00   CT Cervical Spine Wo Contrast  Result Date: 07/07/2019 CLINICAL DATA:  Neck pain after fall EXAM: CT CERVICAL SPINE WITHOUT CONTRAST TECHNIQUE: Multidetector CT imaging of the cervical spine was performed without intravenous contrast. Multiplanar CT image reconstructions were also generated. COMPARISON:  08/15/2018 FINDINGS: Alignment: Facet joints are aligned without dislocation. Dens and lateral masses remain aligned. Trace retrolisthesis C3 on C4 is unchanged. Skull base and vertebrae: No acute fracture. No primary bone lesion or focal pathologic process. Soft tissues and spinal canal: No prevertebral fluid or swelling. Medial course of the left internal carotid artery, a normal variant. No visible canal hematoma. Disc levels: Similar degree of multilevel degenerative disc disease  throughout the cervical spine, most pronounced at C3-4. Relatively mild facet arthropathy. Upper chest: Visualized lung apices are clear. Other: None. IMPRESSION: 1. No acute fracture or traumatic listhesis of the cervical spine. 2. Similar degree of multilevel degenerative disc disease and facet arthropathy. Electronically Signed   By: Duanne Guess D.O.   On: 07/07/2019 14:00   CT Lumbar Spine Wo Contrast  Result Date: 07/07/2019 CLINICAL DATA:  Low back pain secondary to a fall today. EXAM: CT LUMBAR SPINE WITHOUT CONTRAST TECHNIQUE: Multidetector CT imaging of the lumbar spine was performed without intravenous contrast administration. Multiplanar CT image reconstructions were also generated. COMPARISON:  CT scan dated 11/05/2016 and radiographs dated 07/07/2019 FINDINGS: Segmentation: 5 lumbar type vertebra. Alignment: Grade 1 spondylolisthesis at L4-5, increased from 3 mm on 11/05/2016 to 6 mm on the current exam. No other significant alignment abnormalities. Vertebrae: No fractures. Small Schmorl's node in the anterior superior endplate of L1, new since the prior study. However, this does not appear to new compression fracture. Severe facet arthritis at multiple levels in the lumbar spine. Paraspinal and other soft tissues: Aortic atherosclerosis. No acute abnormalities. Disc levels: T11-12: Disc degeneration with a vacuum phenomenon. No disc bulging or protrusion. Slight bilateral facet arthritis. T12-L1: New small Schmorl's node in the superior endplate of L1. No disc bulging or protrusion. Moderate right facet arthritis. Mild left facet arthritis. No change. L1-2: Disc space narrowing. Small broad-based disc bulge without neural impingement. Slight bilateral facet arthritis, stable. Slight narrowing of the spinal canal. L2-3: Disc degeneration with a vacuum phenomenon. No disc bulging or protrusion. Hypertrophy of the ligamentum flavum and facet joints combine to create mild spinal stenosis, stable.  L3-4: Tiny broad-based disc bulge. Marked hypertrophy of the ligamentum flavum and facet joints combine to create moderate spinal stenosis, unchanged.  L4-5: Severe progressive bilateral facet arthritis with progressive spondylolisthesis with progressive chronic severe spinal stenosis with severe compression of both lateral recesses. Moderately severe bilateral foraminal stenosis. There is a disc protrusion into the left disc bulge into the right neural foramen. L5-S1: Chronic disc degeneration. Small broad-based bulge of the uncovered disc. Severe bilateral facet arthritis, progressed since the prior study. Moderate bilateral foraminal stenosis, right greater than left, slightly progressed. IMPRESSION: 1. No acute abnormality of the lumbar spine. New Schmorl's node in the superior endplate of L1 but this is not felt to be acute. 2. Progressive chronic severe spinal stenosis at L4-5 with severe compression of both lateral recesses. 3. Moderately severe bilateral foraminal stenosis at L4-5 and L5-S1 with a disc protrusion into the left disc bulge into the right neural foramen at L4-5. 4.  Aortic Atherosclerosis (ICD10-I70.0). Aortic Atherosclerosis (ICD10-I70.0). Electronically Signed   By: Francene BoyersJames  Maxwell M.D.   On: 07/07/2019 17:20   CT PELVIS WO CONTRAST  Result Date: 07/07/2019 CLINICAL DATA:  Pelvic pain secondary to a fall. EXAM: CT PELVIS WITHOUT CONTRAST TECHNIQUE: Multidetector CT imaging of the pelvis was performed following the standard protocol without intravenous contrast. COMPARISON:  Pelvic radiographs dated 07/07/2019 and sacral radiographs dated 09/08/2017 and CT scan dated 11/05/2016 FINDINGS: Musculoskeletal: There is a fracture through the fifth sacral segment just above the coccyx, previously noted on sacral radiographs dated 09/08/2017. The bones of the pelvis and hips are otherwise normal. Severe degenerative disc and joint disease present in the lower lumbar spine with severe spinal stenosis  at L4-5. Urinary Tract:  No abnormality visualized. Bowel:  Unremarkable visualized pelvic bowel loops. Vascular/Lymphatic: Aortic atherosclerosis.  No adenopathy. Reproductive: Multiple calcifications in the atrophic uterus. No adnexal masses. Other:  None. IMPRESSION: 1. Old fracture of the fifth sacral segment. No acute fractures of the pelvis or hips. 2. Severe degenerative disc and joint disease in the lower lumbar spine with severe spinal stenosis at L4-5. 3. Aortic atherosclerosis. Aortic Atherosclerosis (ICD10-I70.0). Electronically Signed   By: Francene BoyersJames  Maxwell M.D.   On: 07/07/2019 17:25   DG Hips Bilat W or Wo Pelvis 3-4 Views  Result Date: 07/07/2019 CLINICAL DATA:  Fall. EXAM: DG HIP (WITH OR WITHOUT PELVIS) 3-4V BILAT COMPARISON:  Pelvis and right hip x-rays dated August 15, 2018. FINDINGS: There is no evidence of hip fracture or dislocation. Old healed fracture of the right mid femoral diaphysis. No pelvic diastasis. Unchanged mild bilateral hip osteoarthritis. IMPRESSION: No acute osseous abnormality. Electronically Signed   By: Obie DredgeWilliam T Derry M.D.   On: 07/07/2019 15:16    Procedures Procedures (including critical care time)  Medications Ordered in ED Medications  acetaminophen (TYLENOL) tablet 500 mg (has no administration in time range)  amLODipine (NORVASC) tablet 5 mg (has no administration in time range)  furosemide (LASIX) tablet 20 mg (has no administration in time range)  OLANZapine (ZYPREXA) tablet 7.5 mg (has no administration in time range)  pantoprazole (PROTONIX) EC tablet 40 mg (has no administration in time range)  senna-docusate (Senokot-S) tablet 1 tablet (has no administration in time range)  traZODone (DESYREL) tablet 100 mg (has no administration in time range)  fentaNYL (SUBLIMAZE) injection 50 mcg (50 mcg Intravenous Given 07/07/19 1414)    ED Course  I have reviewed the triage vital signs and the nursing notes.  Pertinent labs & imaging results that were  available during my care of the patient were reviewed by me and considered in my medical decision making (see chart for details).  Clinical Course as of Jul 06 2017  Thu Jul 07, 2019  [id number] 86 year old female here with a fall, reporting low back pain, her daughter reeporting back pain has been chronic but worse after the fall.  Pt with L spine ttp on exam and age indeterminate compression fx on xray.  Pending CT L spine.  Also reporting left hip pain with pain with left hip flexion, no clear fx on xray but we will obtain CT left hip in addition.  She is feeling better after pain medications.  Labs otherwise unremarkable.  CTH and C spine negative.  She will need to ambulate here prior to discharge as she lives alone.  We'll consider SW for home Pt/ health aide   [MT]    Clinical Course User Index [MT] Trifan, Kermit Balo, MD   MDM Rules/Calculators/A&P                      Presenting for evaluation after fall.  On exam, patient appears nontoxic.  Neuro intact.  As she hit her head and has neck pain, will obtain CT head and neck.  As patient does not know what caused her fall, will obtain labs, EKG, urine.  Obtain chest x-ray due to subjective fevers at home.  Will obtain pelvis x-ray and lumbar spine x-ray due to pain.  CT head and neck negative for acute findings.  Pelvic x-ray viewed interpreted by me, no obvious fracture or dislocation.  Lumbar x-ray shows age-indeterminate L1 compression fracture, new since 2018.  As patient has focal tenderness over the lumbar spine, will obtain CT of the lumbar spine.  Also, will obtain CT of the pelvis to ensure no occult fracture causing patient's worsened left hip pain.  Case discussed with attending, Dr. Renaye Rakers evaluated the pt.   Labs interpreted by me, overall reassuring.  No leukocytosis.  Electrolytes stable.  Urine without infection.  No obvious medical cause for patient's fall, no medical need for admission at this time.  Discussed at length with  patient's daughter.  Patient's daughter does not feel patient is safe at home by herself.  Patient has a walker, but does not use it at home.  They are requesting options for SNF placement.  Will consult social work and PT.  Discussed with Winfield Rast from SW. Pt is pending insurance approval at a snf rehab. She will need a forma PT consult in the ED tomorrow am (ordered already). Pt will board in the ED until dispo can be worked out [employer]vertising account executive by CSW, family, and PT. Home meds and diet ordered.   Final Clinical Impression(s) / ED Diagnoses Final diagnoses:  Fall, initial encounter  Left hip pain  Traumatic hematoma of occiput, initial encounter    Rx / DC Orders ED Discharge Orders    None       Alveria Apley, PA-C 07/07/19 2019    Lorre Nick, MD 07/11/19 [phone]

## 2019-07-07 NOTE — ED Triage Notes (Signed)
Pt BIBA from home-  Per EMS- Pt with mechanical fall, denies LOC, denies taking blood thinners. Pt with hematoma to back of head, outward rotation and shortening of right leg.    Pt AOx4, ambulatory at baseline.   20 g L AC- 100 mcg Fentanyl (last given 1215).

## 2019-07-08 DIAGNOSIS — S0083XA Contusion of other part of head, initial encounter: Secondary | ICD-10-CM | POA: Diagnosis not present

## 2019-07-08 LAB — URINE CULTURE: Culture: 20000 — AB

## 2019-07-08 NOTE — NC FL2 (Signed)
[place] LEVEL OF CARE SCREENING TOOL     IDENTIFICATION  Patient Name: Theresa Bauer Birthdate: 1929/07/11 Sex: female Admission Date (Current Location): 07/07/2019  [facility] and [place] Number:  Herbalist and Address:  [facility],  [place], [place]      Provider Number: [id number]  Attending Physician Name and Address:  Default, Provider, MD  Relative Name and Phone Number:  Keoni Havey #[phone]    Current Level of Care: SNF Recommended Level of Care: [place] Prior Approval Number:    Date Approved/Denied:   PASRR Number: [id number]  Discharge Plan: SNF    Current Diagnoses: Patient Active Problem List   Diagnosis Date Noted   Status post right cataract extraction 04/22/2017   Primary open angle glaucoma (POAG) of left eye, severe stage 03/03/2017   Hallucination, visual 02/14/2017   Acute psychosis ([place]) 02/14/2017   Low back pain 11/08/2016   Combined forms of age-related cataract of right eye 05/01/2016   Osteoporosis, unspecified 09/30/2012   Branch retinal artery occlusion, left eye 09/30/2012   HYPERTENSION 04/05/2010   VENOUS INSUFFICIENCY, CHRONIC 04/05/2010   GERD 04/05/2010   CHEST PAIN 04/05/2010    Orientation RESPIRATION BLADDER Height & Weight     Self, Situation, Place  Normal Incontinent Weight: 5' Height:  59 kg  BEHAVIORAL SYMPTOMS/MOOD NEUROLOGICAL BOWEL NUTRITION STATUS      Incontinent Diet(Regular)  AMBULATORY STATUS COMMUNICATION OF NEEDS Skin   Limited Assist Verbally Normal                       Personal Care Assistance Level of Assistance  Bathing, Feeding, Dressing Bathing Assistance: Maximum assistance Feeding assistance: Maximum assistance Dressing Assistance: Maximum assistance     Functional Limitations Info  Sight, Speech, Hearing Sight Info: Impaired(Glaucoma) Hearing Info: Impaired(slighty) Speech Info: Adequate     SPECIAL CARE FACTORS FREQUENCY  PT (By licensed PT), OT (By licensed OT)     PT Frequency: 5x per week OT Frequency: 5x per week            Contractures Contractures Info: Not present    Additional Factors Info  Code Status, Allergies Code Status Info: Full Code Allergies Info: Aspirin, Darvon, Penicillin, Darvocet, Lisinopril           Current Medications (07/08/2019):  This is the current hospital active medication list Current Facility-Administered Medications  Medication Dose Route Frequency Provider Last Rate Last Admin   acetaminophen (TYLENOL) tablet 500 mg  500 mg Oral Q12H PRN Caccavale, Sophia, PA-C       amLODipine (NORVASC) tablet 5 mg  5 mg Oral Daily Caccavale, Sophia, PA-C   5 mg at 07/08/19 0941   furosemide (LASIX) tablet 20 mg  20 mg Oral Daily Caccavale, Sophia, PA-C   20 mg at 07/08/19 0941   OLANZapine (ZYPREXA) tablet 7.5 mg  7.5 mg Oral QPM Caccavale, Sophia, PA-C   7.5 mg at 07/07/19 2122   pantoprazole (PROTONIX) EC tablet 40 mg  40 mg Oral Daily Caccavale, Sophia, PA-C   40 mg at 07/08/19 0941   senna-docusate (Senokot-S) tablet 1 tablet  1 tablet Oral QHS Caccavale, Sophia, PA-C   1 tablet at 07/07/19 2121   traZODone (DESYREL) tablet 100 mg  100 mg Oral QHS Caccavale, Sophia, PA-C   100 mg at 07/07/19 2121   Current Outpatient Medications  Medication Sig Dispense Refill   acetaminophen (TYLENOL) 500 MG tablet Take 500  mg by mouth every 12 (twelve) hours as needed for mild pain or moderate pain.     alendronate (FOSAMAX) 70 MG tablet Take 70 mg by mouth once a week.      amLODipine (NORVASC) 5 MG tablet Take 5 mg by mouth daily.      diclofenac Sodium (VOLTAREN) 1 % GEL Apply 2 g topically 4 (four) times daily as needed (pain).     dorzolamide-timolol (COSOPT) 22.3-6.8 MG/ML ophthalmic solution Place 1 drop into both eyes 2 (two) times daily.      furosemide (LASIX) 20 MG tablet Take 20 mg by mouth daily.     latanoprost (XALATAN)  0.005 % ophthalmic solution Place 1 drop into both eyes daily.      loratadine (CLARITIN) 10 MG tablet Take 10 mg by mouth daily.     Multiple Vitamins-Minerals (CENTRUM SILVER 50+WOMEN PO) Take 1 tablet by mouth daily.     OLANZapine (ZYPREXA) 7.5 MG tablet Take 7.5 mg by mouth every evening.     pantoprazole (PROTONIX) 40 MG tablet Take 1 tablet (40 mg total) by mouth daily. 30 tablet 0   senna-docusate (SENOKOT-S) 8.6-50 MG tablet Take 1 tablet by mouth at bedtime. (Patient taking differently: Take 1 tablet by mouth daily. ) 30 tablet 0   traZODone (DESYREL) 100 MG tablet Take 100 mg by mouth at bedtime.     acetaminophen (TYLENOL) 325 MG tablet Take 2 tablets (650 mg total) by mouth every 6 (six) hours as needed for mild pain or moderate pain. (Patient not taking: Reported on 07/07/2019) 60 tablet 0   risperiDONE (RISPERDAL) 0.5 MG tablet Take 1 tablet (0.5 mg total) by mouth 2 (two) times daily. (Patient not taking: Reported on 07/07/2019) 60 tablet 0     Discharge Medications: Please see discharge summary for a list of discharge medications.  Relevant Imaging Results:  Relevant Lab Results:   Additional Information SSN  [id number] Davene Costain, RN

## 2019-07-08 NOTE — ED Notes (Signed)
Patient given hot tea. 

## 2019-07-08 NOTE — ED Notes (Signed)
Patient ambulatory to bedside commode with two staff assist.

## 2019-07-08 NOTE — Evaluation (Signed)
Physical Therapy Evaluation Patient Details Name: Theresa Bauer MRN: [id number] DOB: [birth year] Today's Date: 07/08/2019   History of Present Illness  Pt admitted s/p fall and with hx of previous falls, Bil TKR and dementia with visual hallucinations per dtr  Clinical Impression  Pt admitted as above and presenting with functional mobility limitations 2* generalized weakness and balance deficits.  Pt lives alone, is at high risk for falls at this time and would benefit from follow up rehab at SNF level to maximize IND and safety prior to return home with ltd assist.    Follow Up Recommendations SNF    Equipment Recommendations  None recommended by PT    Recommendations for Other Services       Precautions / Restrictions Precautions Precautions: Fall Restrictions Weight Bearing Restrictions: No      Mobility  Bed Mobility Overal bed mobility: Needs Assistance Bed Mobility: Supine to Sit;Sit to Supine     Supine to sit: Mod assist;+2 for safety/equipment Sit to supine: Mod assist;+2 for physical assistance;+2 for safety/equipment   General bed mobility comments: cues for sequence; physical assist to manage LEs and to control trunk  Transfers Overall transfer level: Needs assistance Equipment used: Rolling walker (2 wheeled) Transfers: Sit to/from Stand Sit to Stand: From elevated surface;Min assist;+2 physical assistance;+2 safety/equipment         General transfer comment: cues for use of UEs to self assist.  Physical assist to bring wt up and forward and to control descent  Ambulation/Gait Ambulation/Gait assistance: Min assist;+2 safety/equipment Gait Distance (Feet): 14 Feet Assistive device: Rolling walker (2 wheeled) Gait Pattern/deviations: Step-to pattern;Decreased step length - right;Decreased step length - left;Shuffle;Trunk flexed Gait velocity: decr   General Gait Details: cues for posture, position from RW.  Assist for balance/support and to manage RW;  Distance ltd by fatigue  Stairs            Wheelchair Mobility    Modified Rankin (Stroke Patients Only)       Balance Overall balance assessment: Needs assistance Sitting-balance support: No upper extremity supported;Feet unsupported Sitting balance-Leahy Scale: Fair     Standing balance support: Bilateral upper extremity supported Standing balance-Leahy Scale: Poor Standing balance comment: Reliant on external support                             Pertinent Vitals/Pain Pain Assessment: Faces Faces Pain Scale: Hurts little more Pain Location: back, R ankle, neck Pain Descriptors / Indicators: Aching;Grimacing;Guarding;Sore Pain Intervention(s): Limited activity within patient's tolerance;Monitored during session    Home Living Family/patient expects to be discharged to:: Skilled nursing facility                      Prior Function Level of Independence: Independent with assistive device(s)         Comments: used RW at home     Hand Dominance        Extremity/Trunk Assessment   Upper Extremity Assessment Upper Extremity Assessment: Generalized weakness    Lower Extremity Assessment Lower Extremity Assessment: Generalized weakness    Cervical / Trunk Assessment Cervical / Trunk Assessment: Kyphotic  Communication   Communication: No difficulties  Cognition Arousal/Alertness: Awake/alert Behavior During Therapy: Flat affect Overall Cognitive Status: Within Functional Limits for tasks assessed  General Comments      Exercises     Assessment/Plan    PT Assessment Patient needs continued PT services  PT Problem List Decreased strength;Decreased activity tolerance;Decreased balance;Decreased mobility;Decreased knowledge of use of DME;Pain;Decreased safety awareness       PT Treatment Interventions DME instruction;Gait training;Functional mobility training;Therapeutic  activities;Therapeutic exercise;Balance training;Patient/family education    PT Goals (Current goals can be found in the Care Plan section)  Acute Rehab PT Goals Patient Stated Goal: Regain IND PT Goal Formulation: With patient Time For Goal Achievement: 07/22/19 Potential to Achieve Goals: Fair    Frequency Min 3X/week   Barriers to discharge Decreased caregiver support Home alone    Co-evaluation               AM-PAC PT "6 Clicks" Mobility  Outcome Measure Help needed turning from your back to your side while in a flat bed without using bedrails?: A Lot Help needed moving from lying on your back to sitting on the side of a flat bed without using bedrails?: A Lot Help needed moving to and from a bed to a chair (including a wheelchair)?: A Lot Help needed standing up from a chair using your arms (e.g., wheelchair or bedside chair)?: A Lot Help needed to walk in hospital room?: A Lot Help needed climbing 3-5 steps with a railing? : A Lot 6 Click Score: 12    End of Session Equipment Utilized During Treatment: Gait belt Activity Tolerance: Patient tolerated treatment well;Patient limited by fatigue Patient left: in bed;with call bell/phone within reach;with family/visitor present Nurse Communication: Mobility status PT Visit Diagnosis: Unsteadiness on feet (R26.81);Muscle weakness (generalized) (M62.81);History of falling (Z91.81);Difficulty in walking, not elsewhere classified (R26.2);Pain    Time: 6295-2841 PT Time Calculation (min) (ACUTE ONLY): 24 min   Charges:   PT Evaluation $PT Eval Low Complexity: 1 Low PT Treatments $Gait Training: 8-22 mins        Mauro Kaufmann PT Acute Rehabilitation Services Pager [phone] Office [phone]   Yosselyn Tax 07/08/2019, 12:22 PM

## 2019-07-08 NOTE — Progress Notes (Addendum)
TOC CM contacted [employer] to start SNF auth. Contacted Dtr, Marylee Floras and she is requesting [facility]. Contacted [facility] to follow up on referral. Faxed necessary paperwork to [facility], pt ref # [id number] (requested number be on fax sheet).   Isidoro Donning RN CCM, [facility] ED TOC Caryl Ada [phone]  07/08/2019 2:58 pm Received call back from [facility], spoke to [place], and they accepted. Notified dtr that they accepted. Pt has been vaccinated for COVID. They will have bed on Monday. Contacted [employer] with update on SNF rehab, [place] is open on weekends.   Isidoro Donning RN CCM, [facility] ED TOC CM [phone]

## 2019-07-08 NOTE — Progress Notes (Signed)
TOC CM waiting PT evaluation for possible SNF placement. Isidoro Donning RN CCM, [facility] ED TOC CM [phone]

## 2019-07-08 NOTE — ED Notes (Signed)
Pure wick has been switched out to a clean one, Call light is in reach, Meal Given, Cleaned up pt area and gave clean linens along with a warm blanket.

## 2019-07-08 NOTE — ED Notes (Signed)
Patient given hot tea. Family at bedside.

## 2019-07-08 NOTE — ED Provider Notes (Signed)
°  Physical Exam  BP 128/60    Pulse 70    Temp 98.3 F (36.8 C) (Oral)    Resp 16    SpO2 100%   Physical Exam  ED Course/Procedures   Clinical Course as of Jul 07 1432  Thu Jul 07, 2019  6810 86 year old female here with a fall, reporting low back pain, her daughter reeporting back pain has been chronic but worse after the fall.  Pt with L spine ttp on exam and age indeterminate compression fx on xray.  Pending CT L spine.  Also reporting left hip pain with pain with left hip flexion, no clear fx on xray but we will obtain CT left hip in addition.  She is feeling better after pain medications.  Labs otherwise unremarkable.  CTH and C spine negative.  She will need to ambulate here prior to discharge as she lives alone.  We'll consider SW for home Pt/ health aide   [MT]    Clinical Course User Index [MT] Trifan, Kermit Balo, MD    Procedures  MDM  Medical hold for placement.  Transitions of care working on at this time.     Benjiman Core, MD 07/08/19 1434

## 2019-07-09 DIAGNOSIS — S0083XA Contusion of other part of head, initial encounter: Secondary | ICD-10-CM | POA: Diagnosis not present

## 2019-07-09 NOTE — ED Provider Notes (Signed)
Patient has not had any issues while boarding thus far. Vital signs reviewed, overall unremarkable. No issues via the nurse. Pending placement.   Pricilla Loveless, MD 07/09/19 [phone]

## 2019-07-09 NOTE — ED Notes (Signed)
Patient using the phone at this time. 

## 2019-07-09 NOTE — ED Notes (Signed)
Family at bedside. 

## 2019-07-09 NOTE — ED Notes (Signed)
Breakfast tray given. Nurse aware. 

## 2019-07-09 NOTE — ED Notes (Signed)
Patient given meal tray.

## 2019-07-09 NOTE — Progress Notes (Signed)
CSW checked [employer] portal - the insurance authorization is still pending at this time.  Edwin Dada, MSW, LCSW-A Transitions of Care  Clinical Social Worker  [facility]  Medical ICU [phone]

## 2019-07-09 NOTE — ED Notes (Signed)
Patient given meal tray. Reports she does not want to eat at this time. Call bell within reach. Reports she will call out for assistance whenever she is ready to eat.

## 2019-07-09 NOTE — ED Notes (Signed)
Patient given hot tea. 

## 2019-07-09 NOTE — ED Notes (Signed)
Patient given bed bath with NT assistance. Clean gown, linen, and blankets applied.

## 2019-07-09 NOTE — ED Notes (Signed)
Assisted patient with eating meal tray.

## 2019-07-10 ENCOUNTER — Emergency Department (HOSPITAL_COMMUNITY): Payer: Medicare Other

## 2019-07-10 ENCOUNTER — Emergency Department (HOSPITAL_BASED_OUTPATIENT_CLINIC_OR_DEPARTMENT_OTHER)
Admit: 2019-07-10 | Discharge: 2019-07-10 | Disposition: A | Discharge status: Home / Self Care | Payer: Medicare Other | Attending: Emergency Medicine | Admitting: Emergency Medicine

## 2019-07-10 DIAGNOSIS — S0083XA Contusion of other part of head, initial encounter: Secondary | ICD-10-CM | POA: Diagnosis not present

## 2019-07-10 DIAGNOSIS — M7989 Other specified soft tissue disorders: Secondary | ICD-10-CM | POA: Diagnosis not present

## 2019-07-10 DIAGNOSIS — R52 Pain, unspecified: Secondary | ICD-10-CM | POA: Diagnosis not present

## 2019-07-10 MED ORDER — LORATADINE 10 MG PO TABS
10.0000 mg | ORAL_TABLET | Freq: Every day | ORAL | Status: DC | PRN
  Administered 2019-07-10 – 2019-07-11 (×2): 10 mg via ORAL
  Filled 2019-07-10 (×2): qty 1

## 2019-07-10 MED ORDER — DORZOLAMIDE HCL-TIMOLOL MAL 2-0.5 % OP SOLN
1.0000 [drp] | Freq: Two times a day (BID) | OPHTHALMIC | Status: DC
  Administered 2019-07-10 – 2019-07-11 (×3): 1 [drp] via OPHTHALMIC
  Filled 2019-07-10: qty 10

## 2019-07-10 MED ORDER — GUAIFENESIN ER 600 MG PO TB12
600.0000 mg | ORAL_TABLET | Freq: Two times a day (BID) | ORAL | Status: DC | PRN
  Administered 2019-07-10 – 2019-07-11 (×2): 600 mg via ORAL
  Filled 2019-07-10 (×3): qty 1

## 2019-07-10 MED ORDER — FUROSEMIDE 20 MG PO TABS
20.0000 mg | ORAL_TABLET | Freq: Every day | ORAL | Status: DC | PRN
  Filled 2019-07-10: qty 1

## 2019-07-10 NOTE — ED Notes (Signed)
Pt will like hot tea at 10pm

## 2019-07-10 NOTE — ED Provider Notes (Signed)
Emergency Medicine Observation Re-evaluation Note  Theresa Bauer is a 86 y.o. female, seen on rounds today.  Pt initially presented to the ED for complaints of Fall Currently, the patient is resting in bed comfortably, awaiting snf placement. Has been having some b/l feet pain that she wants checked out.  Physical Exam  BP (!) 114/46 (BP Location: Right Arm)   Pulse 86   Temp 98.3 F (36.8 C) (Oral)   Resp 18   SpO2 100%  Physical Exam Vitals and nursing note reviewed.  Constitutional:      General: She is not in acute distress.    Appearance: She is well-developed.  HENT:     Head: Normocephalic and atraumatic.  Eyes:     Conjunctiva/sclera: Conjunctivae normal.  Cardiovascular:     Rate and Rhythm: Normal rate.     Pulses: Normal pulses.     Heart sounds: No murmur.  Pulmonary:     Effort: Pulmonary effort is normal. No respiratory distress.  Abdominal:     General: Abdomen is flat.  Musculoskeletal:     Cervical back: Neck supple.     Comments: Mild TTP in b/l lower legs, worse on right, b/l foot tenderness, notably there is no deformity in lower extremities, normal joint ROM, normal dp/pt pulses  Skin:    General: Skin is warm and dry.     Capillary Refill: Capillary refill takes less than 2 seconds.  Neurological:     General: No focal deficit present.     Mental Status: She is alert.     ED Course / MDM  EKG:EKG Interpretation  Date/Time:  Thursday July 07 2019 12:58:16 EDT Ventricular Rate:  112 PR Interval:    QRS Duration: 89 QT Interval:  330 QTC Calculation: 451 R Axis:   8 Text Interpretation: Sinus tachycardia Consider right atrial enlargement Borderline repolarization abnormality No STEMI Confirmed by Alvester Chou [phone]) on 07/07/2019 1:33:47 PM  I have reviewed the labs performed to date as well as medications administered while in observation.  Recent changes in the last 24 hours include awaiting SNF placement. Plan  Current plan is SNF  placement, appreciate continued assistance from CM/SW. Ordered dvt study, plain films though low suspicion for acute process Ordered home eye drops Adjusted lasix to prn per daughter and pt this is how she normally takes it only as needed   Milagros Loll, MD 07/10/19 1004

## 2019-07-10 NOTE — ED Notes (Signed)
Pt was given dinner and her daughter assisted her with meal.

## 2019-07-10 NOTE — Progress Notes (Signed)
Lower ext venous duplex   has been completed. Refer to [facility] under chart review to view preliminary results.   07/10/2019  1:59 PM Theresa Bauer, Gerarda Gunther

## 2019-07-10 NOTE — Progress Notes (Signed)
Physical Therapy Treatment Patient Details Name: Theresa Bauer MRN: [id number] DOB: 09-16-1929 Today's Date: 07/10/2019    History of Present Illness Pt admitted with fall, back pain (chronic but worse after fall). Hx of previous falls, Bil TKR and dementia    PT Comments    Pt continues to require significant assistance for mobility. Pt reports pain in both feet on today. Mobility is limited by weakness, fatigue, and pain.  Pt participates well. She requires increased time to complete tasks. She remains at risk for further falls. Continue to recommend SNF for rehab to improve functional mobility and regain independence.    Follow Up Recommendations  SNF     Equipment Recommendations  None recommended by PT    Recommendations for Other Services       Precautions / Restrictions Precautions Precautions: Fall Restrictions Weight Bearing Restrictions: No    Mobility  Bed Mobility Overal bed mobility: Needs Assistance Bed Mobility: Supine to Sit;Sit to Supine     Supine to sit: Mod assist;HOB elevated Sit to supine: Mod assist;HOB elevated   General bed mobility comments: Increased time. Assist for trunk and bil LEs. Cues for technique, sequencing.  Transfers Overall transfer level: Needs assistance Equipment used: Rolling walker (2 wheeled) Transfers: Sit to/from Stand Sit to Stand: Mod assist         General transfer comment: Assist to power up and stabilize. Cues for safety, technique, hand/LE placement. Increased time. Pt c/o pain in both feet.  Ambulation/Gait Ambulation/Gait assistance: Min assist; +2 safety/equipment Gait Distance (Feet): 3 Feet Assistive device: Rolling walker (2 wheeled) Gait Pattern/deviations: Step-to pattern;Antalgic;Trunk flexed     General Gait Details: Assist to stabilize pt and manage RW. Cues for safety, posture, RW positioning. Distance limited by fatigue, weakness, and pain in both feet.   Stairs             Wheelchair  Mobility    Modified Rankin (Stroke Patients Only)       Balance Overall balance assessment: Needs assistance         Standing balance support: Bilateral upper extremity supported Standing balance-Leahy Scale: Poor                              Cognition Arousal/Alertness: Awake/alert Behavior During Therapy: Flat affect Overall Cognitive Status: Within Functional Limits for tasks assessed                                        Exercises      General Comments        Pertinent Vitals/Pain Pain Assessment: Faces Faces Pain Scale: Hurts even more Pain Location: back, both feet Pain Descriptors / Indicators: Discomfort;Sore;Grimacing;Guarding;Aching Pain Intervention(s): Limited activity within patient's tolerance;Monitored during session;Repositioned    Home Living                      Prior Function            PT Goals (current goals can now be found in the care plan section) Progress towards PT goals: Progressing toward goals    Frequency    Min 2X/week      PT Plan Frequency needs to be updated    Co-evaluation              AM-PAC PT "6 Clicks" Mobility   Outcome  Measure  Help needed turning from your back to your side while in a flat bed without using bedrails?: A Lot Help needed moving from lying on your back to sitting on the side of a flat bed without using bedrails?: A Lot Help needed moving to and from a bed to a chair (including a wheelchair)?: A Lot Help needed standing up from a chair using your arms (e.g., wheelchair or bedside chair)?: A Lot Help needed to walk in hospital room?: A Lot Help needed climbing 3-5 steps with a railing? : Total 6 Click Score: 11    End of Session Equipment Utilized During Treatment: Gait belt Activity Tolerance: Patient limited by fatigue;Patient limited by pain Patient left: in bed;with call bell/phone within reach;with bed alarm set   PT Visit Diagnosis:  Muscle weakness (generalized) (M62.81);Pain;Difficulty in walking, not elsewhere classified (R26.2) Pain - part of body: Ankle and joints of foot     Time: 6415-8309 PT Time Calculation (min) (ACUTE ONLY): 20 min  Charges:  $Gait Training: 8-22 mins                         Doreatha Massed, PT Acute Rehabilitation

## 2019-07-10 NOTE — ED Notes (Signed)
Patient asked me to call her daughter Elayna Tobler. I transferred the call and she spoke with her daughter. Patient asked me to call daughter Ellyssa but she didn't answer the phone.

## 2019-07-10 NOTE — ED Notes (Signed)
I assisted pt with breakfast tray and also clean her up.

## 2019-07-10 NOTE — Progress Notes (Signed)
CSW called [facility] - the insurance authorization for SNF Rehab is still pending at this time.  TOC will follow up again on 07/10/19 if a call is not received from insurance today.  CSW will continue to follow for D/C needs.  Dorothe Pea. Yamari Ventola  MSW, LCSW, LCAS, CCS Transitions of Care Clinical Social Worker Care Coordination Department Ph: [phone]

## 2019-07-10 NOTE — ED Notes (Signed)
Pt had lunch I assisted her with it ans she eat about 75% of her lunch.

## 2019-07-10 NOTE — Progress Notes (Signed)
07/10/2019 1638  Notified MD that patient's daughter is concerned about the pain in patient's feet. Pt states pain is 6/10 in feet. No visible bruises on feet.

## 2019-07-11 DIAGNOSIS — S0083XA Contusion of other part of head, initial encounter: Secondary | ICD-10-CM | POA: Diagnosis not present

## 2019-07-11 NOTE — ED Notes (Signed)
Per case management waiting insurance authorization at present time.

## 2019-07-11 NOTE — ED Notes (Signed)
Transported to nursing home by [facility]. Theresa Bauer had called report. Pt was calm and cooperative. There were no belongings to return.

## 2019-07-11 NOTE — ED Provider Notes (Signed)
Emergency Medicine Observation Re-evaluation Note  Theresa Bauer is a 86 y.o. female, seen on rounds today.  Pt initially presented to the ED for complaints of Fall Currently, the patient is holding in the ED awaiting SNF placement.  Physical Exam  BP (!) 121/48   Pulse 72   Temp 98.3 F (36.8 C) (Oral)   Resp 15   SpO2 100%  Physical Exam Eating breakfast ED Course / MDM  EKG:EKG Interpretation  Date/Time:  Thursday July 07 2019 12:58:16 EDT Ventricular Rate:  112 PR Interval:    QRS Duration: 89 QT Interval:  330 QTC Calculation: 451 R Axis:   8 Text Interpretation: Sinus tachycardia Consider right atrial enlargement Borderline repolarization abnormality No STEMI Confirmed by Alvester Chou [phone]) on 07/07/2019 1:33:47 PM  Clinical Course as of Jul 11 754  Thu Jul 07, 2019  767 86 year old female here with a fall, reporting low back pain, her daughter reeporting back pain has been chronic but worse after the fall.  Pt with L spine ttp on exam and age indeterminate compression fx on xray.  Pending CT L spine.  Also reporting left hip pain with pain with left hip flexion, no clear fx on xray but we will obtain CT left hip in addition.  She is feeling better after pain medications.  Labs otherwise unremarkable.  CTH and C spine negative.  She will need to ambulate here prior to discharge as she lives alone.  We'll consider SW for home Pt/ health aide   [MT]    Clinical Course User Index [MT] Trifan, Kermit Balo, MD   I have reviewed the labs performed to date as well as medications administered while in observation.  No Recent changes in the last 24 hours . Plan  Current plan is for snf placement`.    Theresa Dibbles, MD 07/11/19 [phone]

## 2019-07-11 NOTE — Progress Notes (Addendum)
TOC CM contacted [employer] to follow up on pending auth for SNF. [facility] is still pending. Isidoro Donning RN CCM, [facility] TOC CM [phone]  3:30 pm [facility] approved. Auth # [id number], Case Worker, Idelia Salm. Contacted pt's dtr to make her aware of approval. Dtr wanted to ensure facility was COVID free. Contacted admission coordinator, they are COVID free. States she is attempted to reach dtr to speak to her about getting paperwork completed. [facility] called. Number given to ED RN to call report. Isidoro Donning RN CCM, [facility] TOC CM [phone]

## 2019-07-11 NOTE — ED Notes (Signed)
Pt has been sleeping from the time I got here around 3pm; dinner was offer but she said not right now when just want to sleep for now. Pt dinner is in her room if she request for it later.

## 2019-07-11 NOTE — ED Notes (Signed)
Pt daughter will like a phone call when pt getting transported to rehab facility.

## 2019-07-11 NOTE — Progress Notes (Signed)
07/11/2019 1636  Called Report to [place] [phone]. Report given to Reuel Boom.

## 2019-08-24 ENCOUNTER — Other Ambulatory Visit: Payer: Self-pay

## 2019-08-24 ENCOUNTER — Ambulatory Visit (INDEPENDENT_AMBULATORY_CARE_PROVIDER_SITE_OTHER): Payer: Medicare Other | Admitting: Podiatry

## 2019-08-24 DIAGNOSIS — L84 Corns and callosities: Secondary | ICD-10-CM

## 2019-08-24 DIAGNOSIS — B351 Tinea unguium: Secondary | ICD-10-CM

## 2019-08-24 DIAGNOSIS — M79676 Pain in unspecified toe(s): Secondary | ICD-10-CM | POA: Diagnosis not present

## 2019-08-24 NOTE — Patient Instructions (Signed)
Shoe gear recommendations: [employer] Knit Collection with stretchable uppers.

## 2019-08-28 ENCOUNTER — Encounter: Payer: Self-pay | Admitting: Podiatry

## 2019-08-28 NOTE — Progress Notes (Addendum)
Subjective: Theresa Bauer presents today accompanied by her daughter.  Daughter states Mom had a fall and is now in rehab at [facility].  She  painful callus(es) left heel and painful thick toenails that are difficult to trim. Aggravating factors include wearing enclosed shoe gear. Pain is relieved with periodic professional debridement.  Daughter would like shoe recommendations for her Mother on today's visit.  Fleet Contras, MD is patient's PCP.  Past Medical History:  Diagnosis Date  . Dementia [facility])    Per pt's daughter  . GERD (gastroesophageal reflux disease)   . Glaucoma   . Hallucination, visual    per pt's daughter  . Hypertension      Current Outpatient Medications on File Prior to Visit  Medication Sig Dispense Refill  . ferrous sulfate 325 (65 FE) MG tablet Take 325 mg by mouth daily with breakfast.    . liver oil-zinc oxide (DESITIN) 40 % ointment Apply 1 application topically as needed for irritation.    . trolamine salicylate (ASPERCREME) 10 % cream Apply 1 application topically as needed for muscle pain.    Marland Kitchen acetaminophen (TYLENOL) 500 MG tablet Take 500 mg by mouth every 12 (twelve) hours as needed for mild pain or moderate pain.    Marland Kitchen alendronate (FOSAMAX) 70 MG tablet Take 70 mg by mouth once a week.     Marland Kitchen amLODipine (NORVASC) 5 MG tablet Take 5 mg by mouth daily.     . diclofenac Sodium (VOLTAREN) 1 % GEL Apply 2 g topically 4 (four) times daily as needed (pain).    . dorzolamide-timolol (COSOPT) 22.3-6.8 MG/ML ophthalmic solution Place 1 drop into both eyes 2 (two) times daily.     . furosemide (LASIX) 20 MG tablet Take 20 mg by mouth daily.    Marland Kitchen latanoprost (XALATAN) 0.005 % ophthalmic solution Place 1 drop into both eyes daily.     Marland Kitchen loratadine (CLARITIN) 10 MG tablet Take 10 mg by mouth daily.    . Multiple Vitamins-Minerals (CENTRUM SILVER 50+WOMEN PO) Take 1 tablet by mouth daily.    Marland Kitchen OLANZapine (ZYPREXA) 7.5 MG tablet Take 7.5 mg by mouth  every evening.    . pantoprazole (PROTONIX) 40 MG tablet Take 1 tablet (40 mg total) by mouth daily. 30 tablet 0  . senna-docusate (SENOKOT-S) 8.6-50 MG tablet Take 1 tablet by mouth at bedtime. (Patient taking differently: Take 1 tablet by mouth daily. ) 30 tablet 0  . traZODone (DESYREL) 100 MG tablet Take 100 mg by mouth at bedtime.    . [DISCONTINUED] gabapentin (NEURONTIN) 300 MG capsule Take 1 capsule (300 mg total) by mouth 2 (two) times daily. (Patient not taking: Reported on 09/08/2017) 60 capsule 0   No current facility-administered medications on file prior to visit.     Allergies  Allergen Reactions  . Lisinopril Anaphylaxis  . Darvocet [Propoxyphene N-Acetaminophen] Itching  . Aspirin Hives    itching  . Brimonidine Itching  . Penicillins Hives and Itching    Itching Has patient had a PCN reaction causing immediate rash, facial/tongue/throat swelling, SOB or lightheadedness with hypotension: no Has patient had a PCN reaction causing severe rash involving mucus membranes or skin necrosis: no Has patient had a PCN reaction that required hospitalization: no Has patient had a PCN reaction occurring within the last 10 years: no If all of the above answers are "NO", then may proceed with Cephalosporin use.   . Darvon [Propoxyphene] Itching    Objective: Theresa Bauer  is a pleasant 86 y.o. African American female in NAD. AAO x 3.  There were no vitals filed for this visit.  Vascular Examination: Capillary refill time to digits immediate b/l. Palpable DP pulses b/l. Palpable PT pulses b/l. Pedal hair present b/l. Skin temperature gradient within normal limits b/l.  Dermatological Examination: Pedal skin with normal turgor, texture and tone bilaterally. No open wounds bilaterally. No interdigital macerations bilaterally. Toenails 1-5 b/l elongated, discolored, dystrophic, thickened, crumbly with subungual debris and tenderness to dorsal palpation. Hyperkeratotic lesion(s)  posterior aspect of heel left foot.  No erythema, no edema, no drainage, no flocculence.   Musculoskeletal: Normal muscle strength 5/5 to all lower extremity muscle groups bilaterally. No pain crepitus or joint limitation noted with ROM b/l. Hammertoes noted to the 2-5 bilaterally.  Neurological Examination: Protective sensation intact 5/5 intact bilaterally with 10g monofilament b/l. Vibratory sensation intact b/l.  Assessment: 1. Pain due to onychomycosis of toenail   2. Callus     Plan: -Examined patient. -Callus pared left heel utilizing sterile scalpel blade without incident.   -Dispensed heel protectors with orders written to facility to wear heel protectors at all times when in bed. -Toenails 1-5 b/l were debrided in length and girth with sterile nail nippers and dremel without iatrogenic bleeding. Offending nail border debrided and curretaged left hallux. Border cleansed with alcohol and triple antibiotic applied. No further treatment required by patient/caregiver. -Ms. Soler's daughter was given written shoe recommendations on today's visit: [employer] Knit Collection with stretchable uppers. -Patient to continue soft, supportive shoe gear daily. -Patient to report any pedal injuries to medical professional immediately. -Patient/POA to call should there be question/concern in the interim.  Return in about 3 months (around 11/24/2019) for nail trim.  Freddie Breech, DPM

## 2019-11-30 ENCOUNTER — Ambulatory Visit: Payer: Medicare Other | Admitting: Podiatry

## 2019-12-01 ENCOUNTER — Encounter: Payer: Self-pay | Admitting: Vascular Surgery

## 2019-12-01 ENCOUNTER — Other Ambulatory Visit: Payer: Self-pay

## 2019-12-01 ENCOUNTER — Ambulatory Visit (INDEPENDENT_AMBULATORY_CARE_PROVIDER_SITE_OTHER): Payer: Medicare Other | Admitting: Vascular Surgery

## 2019-12-01 VITALS — BP 146/75 | HR 66 | Temp 97.2°F | Resp 18 | Ht 60.0 in | Wt 138.4 lb

## 2019-12-01 DIAGNOSIS — I739 Peripheral vascular disease, unspecified: Secondary | ICD-10-CM

## 2019-12-01 NOTE — Progress Notes (Signed)
Referring Physician: Dr. Harrison Mons  Patient name: Theresa Bauer MRN: [id number] DOB: 1929/09/18 Sex: female  REASON FOR CONSULT: Left hip pain  HPI: Theresa Bauer is a 86 y.o. female, who fell several months ago.  Patient has had intermittent left hip pain and left leg pain since then.  Her gait has also changed.  She also slouches to the right.  Patient's daughter was with her for the office visit today.  She states apparently some x-rays were done which showed she had some injury to her back.  She has been living at [facility] recently.  They are trying to transition her to an assisted living.  She is able to walk some with a walker.  This is minimal.  She does not really describe claudication rest pain.  She does not have any open wounds on her foot.  I had previously seen her for chronic leg swelling in 2012 and in 2016.  She has persistent left leg swelling.  Other medical problems include mild dementia, degenerative joint disease.  At the time of her fall in June she had fairly extensive x-rays which showed primarily severe spinal stenosis but no acute fracture.  These are both stable.  Past Medical History:  Diagnosis Date  . Dementia [facility])    Per pt's daughter  . GERD (gastroesophageal reflux disease)   . Glaucoma   . Hallucination, visual    per pt's daughter  . Hypertension    Past Surgical History:  Procedure Laterality Date  . CATARACT EXTRACTION Left   . GLAUCOMA REPAIR Left   . REPLACEMENT TOTAL KNEE BILATERAL  2007/2008    Family History  Problem Relation Age of Onset  . Heart attack Mother   . Heart attack Father   . Osteoarthritis Father     SOCIAL HISTORY: Social History   Socioeconomic History  . Marital status: Widowed    Spouse name: Not on file  . Number of children: Not on file  . Years of education: Not on file  . Highest education level: Not on file  Occupational History  . Occupation: Retired  Tobacco Use  . Smoking status: Never Smoker  .  Smokeless tobacco: Never Used  Vaping Use  . Vaping Use: Never used  Substance and Sexual Activity  . Alcohol use: No    Alcohol/week: 0.0 standard drinks  . Drug use: No  . Sexual activity: Not Currently  Other Topics Concern  . Not on file  Social History Narrative   Lives alone in apartment. Cares for self. Rides the bus. Dtr helps.   Social Determinants of Health   Financial Resource Strain:   . Difficulty of Paying Living Expenses: Not on file  Food Insecurity:   . Worried About Programme researcher, broadcasting/film/video in the Last Year: Not on file  . Ran Out of Food in the Last Year: Not on file  Transportation Needs:   . Lack of Transportation (Medical): Not on file  . Lack of Transportation (Non-Medical): Not on file  Physical Activity:   . Days of Exercise per Week: Not on file  . Minutes of Exercise per Session: Not on file  Stress:   . Feeling of Stress : Not on file  Social Connections:   . Frequency of Communication with Friends and Family: Not on file  . Frequency of Social Gatherings with Friends and Family: Not on file  . Attends Religious Services: Not on file  . Active Member of Clubs or  Organizations: Not on file  . Attends Banker Meetings: Not on file  . Marital Status: Not on file  Intimate Partner Violence:   . Fear of Current or Ex-Partner: Not on file  . Emotionally Abused: Not on file  . Physically Abused: Not on file  . Sexually Abused: Not on file    Allergies  Allergen Reactions  . Lisinopril Anaphylaxis  . Darvocet [Propoxyphene N-Acetaminophen] Itching  . Aspirin Hives    itching  . Brimonidine Itching  . Penicillins Hives and Itching    Itching Has patient had a PCN reaction causing immediate rash, facial/tongue/throat swelling, SOB or lightheadedness with hypotension: no Has patient had a PCN reaction causing severe rash involving mucus membranes or skin necrosis: no Has patient had a PCN reaction that required hospitalization: no Has  patient had a PCN reaction occurring within the last 10 years: no If all of the above answers are "NO", then may proceed with Cephalosporin use.   . Darvon [Propoxyphene] Itching    Current Outpatient Medications  Medication Sig Dispense Refill  . acetaminophen (TYLENOL) 500 MG tablet Take 500 mg by mouth every 12 (twelve) hours as needed for mild pain or moderate pain.    Marland Kitchen alendronate (FOSAMAX) 70 MG tablet Take 70 mg by mouth once a week.     Marland Kitchen amLODipine (NORVASC) 5 MG tablet Take 5 mg by mouth daily.     . diclofenac Sodium (VOLTAREN) 1 % GEL Apply 2 g topically 4 (four) times daily as needed (pain).    . dorzolamide-timolol (COSOPT) 22.3-6.8 MG/ML ophthalmic solution Place 1 drop into both eyes 2 (two) times daily.     . ferrous sulfate 325 (65 FE) MG tablet Take 325 mg by mouth daily with breakfast.    . furosemide (LASIX) 20 MG tablet Take 20 mg by mouth daily.    Marland Kitchen latanoprost (XALATAN) 0.005 % ophthalmic solution Place 1 drop into both eyes daily.     Marland Kitchen liver oil-zinc oxide (DESITIN) 40 % ointment Apply 1 application topically as needed for irritation.    Marland Kitchen loratadine (CLARITIN) 10 MG tablet Take 10 mg by mouth daily.    . Multiple Vitamins-Minerals (CENTRUM SILVER 50+WOMEN PO) Take 1 tablet by mouth daily.    Marland Kitchen OLANZapine (ZYPREXA) 7.5 MG tablet Take 7.5 mg by mouth every evening.    . pantoprazole (PROTONIX) 40 MG tablet Take 1 tablet (40 mg total) by mouth daily. 30 tablet 0  . senna-docusate (SENOKOT-S) 8.6-50 MG tablet Take 1 tablet by mouth at bedtime. (Patient taking differently: Take 1 tablet by mouth daily. ) 30 tablet 0  . traZODone (DESYREL) 100 MG tablet Take 100 mg by mouth at bedtime.    . trolamine salicylate (ASPERCREME) 10 % cream Apply 1 application topically as needed for muscle pain.     No current facility-administered medications for this visit.    ROS:   General:  No weight loss, Fever, chills  HEENT: No recent headaches, no nasal bleeding, no visual  changes, no sore throat  Neurologic: No dizziness, blackouts, seizures. No recent symptoms of stroke or mini- stroke. No recent episodes of slurred speech, or temporary blindness.  Cardiac: No recent episodes of chest pain/pressure, no shortness of breath at rest.  + shortness of breath with exertion.  Denies history of atrial fibrillation or irregular heartbeat  Vascular: No history of rest pain in feet.  No history of claudication.  No history of non-healing ulcer, No history of DVT  Pulmonary: No home oxygen, no productive cough, no hemoptysis,  No asthma or wheezing  Musculoskeletal:  [X]  Arthritis, [X]  Low back pain,  [X]  Joint pain  Hematologic:No history of hypercoagulable state.  No history of easy bleeding.  No history of anemia  Gastrointestinal: No hematochezia or melena,  No gastroesophageal reflux, no trouble swallowing  Urinary: [ ]  chronic Kidney disease, [ ]  on HD - [ ]  MWF or [ ]  TTHS, [ ]  Burning with urination, [ ]  Frequent urination, [ ]  Difficulty urinating;   Skin: No rashes  Psychological: No history of anxiety,  No history of depression   Physical Examination  Vitals:   12/01/19 1005  BP: (!) 146/75  Pulse: 66  Resp: 18  Temp: (!) 97.2 F (36.2 C)  SpO2: 95%  Weight: 138 lb 6.4 oz (62.8 kg)  Height: 5' (1.524 m)    Body mass index is 27.03 kg/m.  General:  Alert and oriented, no acute distress HEENT: Normal Neck: No JVD Cardiac: Regular Rate and Rhythm Extremity Pulses:  2+ radial, brachial, femoral, dorsalis pedis pulses bilaterally Musculoskeletal: No deformity or edema  Neurologic: Upper and lower extremity motor 5/5 and symmetric  DATA:  I reviewed an ultrasound report dated August 2021 from [facility].  This showed no evidence of DVT.  She did have atherosclerotic occlusive disease and possible left superficial femoral artery stenosis.  No ABI was reported.  ASSESSMENT: Patient with left hip pain most likely degenerative arthritis and  related to her spinal stenosis.  She has a palpable pulse in her foot.  I do not believe her symptoms are related to peripheral arterial disease.  She does have some calcification and atherosclerosis on her duplex exam and this is certainly consistent with being 86 years old.  Left leg swelling is most likely chronic.  No evidence of DVT.   PLAN: No significant explanation for her current symptoms based on a vascular standpoint.  She will follow-up with on an as-needed basis.  Her daughter is requesting orthopedic evaluation due to her gait change and slouching to one side.  Since she has seen Dr. in the past we will refer to their practice.  I did discuss with the daughter today that she should understand what her goals of care would be as far as her left leg is concerned.  She seemed to have fairly realistic goals that her mother would probably not ambulate very efficiently but mainly could have her pain controled   , MD [facility] Office: [phone]

## 2019-12-05 ENCOUNTER — Encounter: Payer: Self-pay | Admitting: Podiatry

## 2019-12-05 ENCOUNTER — Ambulatory Visit (INDEPENDENT_AMBULATORY_CARE_PROVIDER_SITE_OTHER): Payer: Medicaid Other | Admitting: Podiatry

## 2019-12-05 ENCOUNTER — Other Ambulatory Visit: Payer: Self-pay

## 2019-12-05 DIAGNOSIS — B351 Tinea unguium: Secondary | ICD-10-CM

## 2019-12-05 DIAGNOSIS — M79676 Pain in unspecified toe(s): Secondary | ICD-10-CM

## 2019-12-08 NOTE — Progress Notes (Signed)
Subjective: Theresa Bauer presents today accompanied by her son, Theresa Bauer, on today's visit.  She continues to reside at [facility] and [address].  She  painful callus(es) left heel and painful thick toenails that are difficult to trim. Aggravating factors include wearing enclosed shoe gear. Pain is relieved with periodic professional debridement.  Theresa Contras, MD is patient's PCP.  Past Medical History:  Diagnosis Date  . Dementia [facility])    Per pt's daughter  . GERD (gastroesophageal reflux disease)   . Glaucoma   . Hallucination, visual    per pt's daughter  . Hypertension      Current Outpatient Medications on File Prior to Visit  Medication Sig Dispense Refill  . acetaminophen (TYLENOL) 500 MG tablet Take 500 mg by mouth every 12 (twelve) hours as needed for mild pain or moderate pain.    Marland Kitchen alendronate (FOSAMAX) 70 MG tablet Take 70 mg by mouth once a week.     Marland Kitchen amLODipine (NORVASC) 5 MG tablet Take 5 mg by mouth daily.     . diclofenac Sodium (VOLTAREN) 1 % GEL Apply 2 g topically 4 (four) times daily as needed (pain).    . dorzolamide-timolol (COSOPT) 22.3-6.8 MG/ML ophthalmic solution Place 1 drop into both eyes 2 (two) times daily.     . ferrous sulfate 325 (65 FE) MG tablet Take 325 mg by mouth daily with breakfast.    . furosemide (LASIX) 20 MG tablet Take 20 mg by mouth daily.    Marland Kitchen latanoprost (XALATAN) 0.005 % ophthalmic solution Place 1 drop into both eyes daily.     Marland Kitchen liver oil-zinc oxide (DESITIN) 40 % ointment Apply 1 application topically as needed for irritation.    Marland Kitchen loratadine (CLARITIN) 10 MG tablet Take 10 mg by mouth daily.    . Multiple Vitamins-Minerals (CENTRUM SILVER 50+WOMEN PO) Take 1 tablet by mouth daily.    Marland Kitchen OLANZapine (ZYPREXA) 7.5 MG tablet Take 7.5 mg by mouth every evening.    . pantoprazole (PROTONIX) 40 MG tablet Take 1 tablet (40 mg total) by mouth daily. 30 tablet 0  . senna-docusate (SENOKOT-S) 8.6-50 MG tablet Take 1 tablet by mouth at  bedtime. (Patient taking differently: Take 1 tablet by mouth daily. ) 30 tablet 0  . traZODone (DESYREL) 100 MG tablet Take 100 mg by mouth at bedtime.    . trolamine salicylate (ASPERCREME) 10 % cream Apply 1 application topically as needed for muscle pain.    . [DISCONTINUED] gabapentin (NEURONTIN) 300 MG capsule Take 1 capsule (300 mg total) by mouth 2 (two) times daily. (Patient not taking: Reported on 09/08/2017) 60 capsule 0   No current facility-administered medications on file prior to visit.     Allergies  Allergen Reactions  . Lisinopril Anaphylaxis  . Darvocet [Propoxyphene N-Acetaminophen] Itching  . Aspirin Hives    itching  . Brimonidine Itching  . Penicillins Hives and Itching    Itching Has patient had a PCN reaction causing immediate rash, facial/tongue/throat swelling, SOB or lightheadedness with hypotension: no Has patient had a PCN reaction causing severe rash involving mucus membranes or skin necrosis: no Has patient had a PCN reaction that required hospitalization: no Has patient had a PCN reaction occurring within the last 10 years: no If all of the above answers are "NO", then may proceed with Cephalosporin use.   . Darvon [Propoxyphene] Itching    Objective: Theresa Bauer is a pleasant 86 y.o. African American female in NAD. AAO x 3.  There  were no vitals filed for this visit.  Vascular Examination: Capillary refill time to digits immediate b/l. Palpable DP pulses b/l. Palpable PT pulses b/l. Pedal hair present b/l. Skin temperature gradient within normal limits b/l.  Dermatological Examination: Pedal skin with normal turgor, texture and tone bilaterally. No open wounds bilaterally. No interdigital macerations bilaterally. Toenails 1-5 b/l elongated, discolored, dystrophic, thickened, crumbly with subungual debris and tenderness to dorsal palpation. Hyperkeratotic lesion(s) posterior aspect of heel left foot.  No erythema, no edema, no drainage, no flocculence.    Musculoskeletal: Normal muscle strength 5/5 to all lower extremity muscle groups bilaterally. No pain crepitus or joint limitation noted with ROM b/l. Hammertoes noted to the 2-5 bilaterally.  Neurological Examination: Protective sensation intact 5/5 intact bilaterally with 10g monofilament b/l. Vibratory sensation intact b/l.  Assessment: 1. Pain due to onychomycosis of toenail     Plan: -Examined patient.  -Continue heel protectors with orders written to facility to wear heel protectors at all times when in bed. -Toenails 1-5 b/l were debrided in length and girth with sterile nail nippers and dremel without iatrogenic bleeding. Offending nail border debrided and curretaged left hallux. Border cleansed with alcohol and triple antibiotic applied. No further treatment required by patient/caregiver. -Patient to continue soft, supportive shoe gear daily. -Patient to report any pedal injuries to medical professional immediately. -Patient/POA to call should there be question/concern in the interim.  Return in about 3 months (around 03/06/2020) for 3 month toenail debridement.  Freddie Breech, DPM

## 2020-03-12 ENCOUNTER — Ambulatory Visit: Payer: Medicaid Other | Admitting: Podiatry

## 2020-03-14 ENCOUNTER — Other Ambulatory Visit: Payer: Self-pay

## 2020-03-14 ENCOUNTER — Encounter: Payer: Self-pay | Admitting: Podiatry

## 2020-03-14 ENCOUNTER — Ambulatory Visit (INDEPENDENT_AMBULATORY_CARE_PROVIDER_SITE_OTHER): Payer: Medicare Other | Admitting: Podiatry

## 2020-03-14 DIAGNOSIS — M79676 Pain in unspecified toe(s): Secondary | ICD-10-CM | POA: Diagnosis not present

## 2020-03-14 DIAGNOSIS — B351 Tinea unguium: Secondary | ICD-10-CM | POA: Diagnosis not present

## 2020-03-18 NOTE — Progress Notes (Signed)
Subjective: Theresa Bauer presents today for f/u of painful, elongated, mycotic toenails.   She continues to reside at [facility] and [address].  She states calluses on heels have resolved. She voices no pain on today's visit.l   Theresa Contras, MD is patient's PCP.   Past Medical History:  Diagnosis Date  . Dementia [facility])    Per pt's daughter  . GERD (gastroesophageal reflux disease)   . Glaucoma   . Hallucination, visual    per pt's daughter  . Hypertension      Current Outpatient Medications on File Prior to Visit  Medication Sig Dispense Refill  . calcium-vitamin D (OSCAL WITH D) 500-200 MG-UNIT tablet Take 1 tablet by mouth.    . GUAIFENESIN PO Take by mouth.    . lidocaine (XYLOCAINE) 4 % external solution Apply topically as needed.    . melatonin 5 MG TABS Take 5 mg by mouth.    . Menthol 4 MG LOZG Use as directed in the mouth or throat.    . polyethylene glycol (MIRALAX / GLYCOLAX) 17 g packet Take 17 g by mouth daily.    . vitamin C (ASCORBIC ACID) 500 MG tablet Take 500 mg by mouth daily.    Marland Kitchen acetaminophen (TYLENOL) 500 MG tablet Take 500 mg by mouth every 12 (twelve) hours as needed for mild pain or moderate pain.    Marland Kitchen alendronate (FOSAMAX) 70 MG tablet Take 70 mg by mouth once a week.     Marland Kitchen amLODipine (NORVASC) 5 MG tablet Take 5 mg by mouth daily.     Marland Kitchen amoxicillin (AMOXIL) 250 MG capsule Take by mouth at bedtime.    . clindamycin (CLEOCIN) 300 MG capsule Take 300 mg by mouth 2 (two) times daily.    Marland Kitchen DIAZEPAM PO     . diclofenac Sodium (VOLTAREN) 1 % GEL Apply 2 g topically 4 (four) times daily as needed (pain).    . dorzolamide-timolol (COSOPT) 22.3-6.8 MG/ML ophthalmic solution Place 1 drop into both eyes 2 (two) times daily.     . ferrous sulfate 325 (65 FE) MG tablet Take 325 mg by mouth daily with breakfast.    . furosemide (LASIX) 20 MG tablet Take 20 mg by mouth daily.    . Ibandronate Sodium (BONIVA PO)     . latanoprost (XALATAN) 0.005 % ophthalmic  solution Place 1 drop into both eyes daily.     Marland Kitchen LISINOPRIL PO     . liver oil-zinc oxide (DESITIN) 40 % ointment Apply 1 application topically as needed for irritation.    Marland Kitchen loratadine (CLARITIN) 10 MG tablet Take 10 mg by mouth daily.    Marland Kitchen LORATADINE PO     . meloxicam (MOBIC) 7.5 MG tablet Take 7.5 mg by mouth daily.    . methylPREDNISolone (MEDROL DOSEPAK) 4 MG TBPK tablet Take by mouth.    . methylPREDNISolone (MEDROL) 4 MG tablet Take by mouth at bedtime.    . Multiple Vitamins-Minerals (CENTRUM SILVER 50+WOMEN PO) Take 1 tablet by mouth daily.    . Multiple Vitamins-Minerals (CENTRUM SILVER PO)     . OLANZapine (ZYPREXA) 7.5 MG tablet Take 7.5 mg by mouth every evening.    Marland Kitchen omeprazole (PRILOSEC) 20 MG capsule Take 20 mg by mouth daily.    . pantoprazole (PROTONIX) 40 MG tablet Take 1 tablet (40 mg total) by mouth daily. 30 tablet 0  . predniSONE (DELTASONE) 20 MG tablet Take 20 mg by mouth daily.    Marland Kitchen senna-docusate (SENOKOT-S)  8.6-50 MG tablet Take 1 tablet by mouth at bedtime. (Patient taking differently: Take 1 tablet by mouth daily. ) 30 tablet 0  . traZODone (DESYREL) 50 MG tablet Take by mouth.    . trolamine salicylate (ASPERCREME) 10 % cream Apply 1 application topically as needed for muscle pain.    . [DISCONTINUED] gabapentin (NEURONTIN) 300 MG capsule Take 1 capsule (300 mg total) by mouth 2 (two) times daily. (Patient not taking: Reported on 09/08/2017) 60 capsule 0   No current facility-administered medications on file prior to visit.     Allergies  Allergen Reactions  . Lisinopril Anaphylaxis  . Darvocet [Propoxyphene N-Acetaminophen] Itching  . Aspirin Hives    itching  . Brimonidine Itching  . Penicillin G     Other reaction(s): Unknown  . Penicillins Hives and Itching    Itching Has patient had a PCN reaction causing immediate rash, facial/tongue/throat swelling, SOB or lightheadedness with hypotension: no Has patient had a PCN reaction causing severe rash  involving mucus membranes or skin necrosis: no Has patient had a PCN reaction that required hospitalization: no Has patient had a PCN reaction occurring within the last 10 years: no If all of the above answers are "NO", then may proceed with Cephalosporin use.   . Darvon [Propoxyphene] Itching    Objective: Theresa Bauer is a pleasant 87 y.o. African American female in NAD. AAO x 3.  There were no vitals filed for this visit.  Vascular Examination: Capillary refill time to digits immediate b/l. Palpable DP pulses b/l. Palpable PT pulses b/l. Pedal hair present b/l. Skin temperature gradient within normal limits b/l.  Dermatological Examination: Pedal skin with normal turgor, texture and tone bilaterally. No open wounds bilaterally. No interdigital macerations bilaterally. Toenails 1-5 b/l elongated, discolored, dystrophic, thickened, crumbly with subungual debris and tenderness to dorsal palpation. No hyperkeratoses noted on today's visit.  Musculoskeletal: Normal muscle strength 5/5 to all lower extremity muscle groups bilaterally. No pain crepitus or joint limitation noted with ROM b/l. Hammertoes noted to the 2-5 bilaterally.  Neurological Examination: Protective sensation intact 5/5 intact bilaterally with 10g monofilament b/l. Vibratory sensation intact b/l.  Assessment: 1. Pain due to onychomycosis of toenail     Plan: -Examined patient. -Continue heel protectors with orders written to facility to wear heel protectors at all times when in bed. -Toenails 1-5 b/l were debrided in length and girth with sterile nail nippers and dremel without iatrogenic bleeding. Offending nail border debrided and curretaged left hallux. Border cleansed with alcohol and triple antibiotic applied. No further treatment required by patient/caregiver. -Patient to continue soft, supportive shoe gear daily. -Patient to report any pedal injuries to medical professional immediately. -Patient/POA to call  should there be question/concern in the interim.  Return in about 3 months (around 06/11/2020).  Freddie Breech, DPM

## 2020-05-31 ENCOUNTER — Emergency Department (HOSPITAL_COMMUNITY): Payer: Medicare Other

## 2020-05-31 ENCOUNTER — Encounter (HOSPITAL_COMMUNITY): Payer: Self-pay

## 2020-05-31 ENCOUNTER — Emergency Department (HOSPITAL_COMMUNITY)
Admission: EM | Admit: 2020-05-31 | Discharge: 2020-05-31 | Disposition: A | Discharge status: Home / Self Care | Payer: Medicare Other | Attending: Emergency Medicine | Admitting: Emergency Medicine

## 2020-05-31 DIAGNOSIS — M79604 Pain in right leg: Secondary | ICD-10-CM | POA: Insufficient documentation

## 2020-05-31 DIAGNOSIS — I10 Essential (primary) hypertension: Secondary | ICD-10-CM | POA: Diagnosis not present

## 2020-05-31 DIAGNOSIS — Z79899 Other long term (current) drug therapy: Secondary | ICD-10-CM | POA: Diagnosis not present

## 2020-05-31 DIAGNOSIS — M79605 Pain in left leg: Secondary | ICD-10-CM | POA: Insufficient documentation

## 2020-05-31 DIAGNOSIS — Z96652 Presence of left artificial knee joint: Secondary | ICD-10-CM | POA: Insufficient documentation

## 2020-05-31 DIAGNOSIS — Z96651 Presence of right artificial knee joint: Secondary | ICD-10-CM | POA: Diagnosis not present

## 2020-05-31 DIAGNOSIS — F0390 Unspecified dementia without behavioral disturbance: Secondary | ICD-10-CM | POA: Diagnosis not present

## 2020-05-31 DIAGNOSIS — R519 Headache, unspecified: Secondary | ICD-10-CM | POA: Insufficient documentation

## 2020-05-31 DIAGNOSIS — M25519 Pain in unspecified shoulder: Secondary | ICD-10-CM

## 2020-05-31 DIAGNOSIS — W19XXXA Unspecified fall, initial encounter: Secondary | ICD-10-CM | POA: Insufficient documentation

## 2020-05-31 LAB — CBC WITH DIFFERENTIAL/PLATELET
Abs Immature Granulocytes: 0.01 10*3/uL (ref 0.00–0.07)
Basophils Absolute: 0 10*3/uL (ref 0.0–0.1)
Basophils Relative: 1 %
Eosinophils Absolute: 0.1 10*3/uL (ref 0.0–0.5)
Eosinophils Relative: 1 %
HCT: 36 % (ref 36.0–46.0)
Hemoglobin: 11.6 g/dL — ABNORMAL LOW (ref 12.0–15.0)
Immature Granulocytes: 0 %
Lymphocytes Relative: 45 %
Lymphs Abs: 2.3 10*3/uL (ref 0.7–4.0)
MCH: 24.4 pg — ABNORMAL LOW (ref 26.0–34.0)
MCHC: 32.2 g/dL (ref 30.0–36.0)
MCV: 75.8 fL — ABNORMAL LOW (ref 80.0–100.0)
Monocytes Absolute: 0.7 10*3/uL (ref 0.1–1.0)
Monocytes Relative: 13 %
Neutro Abs: 2 10*3/uL (ref 1.7–7.7)
Neutrophils Relative %: 40 %
Platelets: 226 10*3/uL (ref 150–400)
RBC: 4.75 MIL/uL (ref 3.87–5.11)
RDW: 15.5 % (ref 11.5–15.5)
WBC: 5 10*3/uL (ref 4.0–10.5)
nRBC: 0 % (ref 0.0–0.2)

## 2020-05-31 LAB — BASIC METABOLIC PANEL
Anion gap: 7 (ref 5–15)
BUN: 12 mg/dL (ref 8–23)
CO2: 26 mmol/L (ref 22–32)
Calcium: 9.2 mg/dL (ref 8.9–10.3)
Chloride: 108 mmol/L (ref 98–111)
Creatinine, Ser: 0.68 mg/dL (ref 0.44–1.00)
GFR, Estimated: >60 mL/min (ref >60)
Glucose, Bld: 93 mg/dL (ref 70–99)
Potassium: 4 mmol/L (ref 3.5–5.1)
Sodium: 141 mmol/L (ref 135–145)

## 2020-05-31 MED ORDER — OXYCODONE-ACETAMINOPHEN 5-325 MG PO TABS
1.0000 | ORAL_TABLET | Freq: Once | ORAL | Status: AC
  Administered 2020-05-31: 1 via ORAL
  Filled 2020-05-31: qty 1

## 2020-05-31 MED ORDER — MORPHINE SULFATE (PF) 2 MG/ML IV SOLN
2.0000 mg | Freq: Once | INTRAVENOUS | Status: AC
  Administered 2020-05-31: 2 mg via INTRAVENOUS
  Filled 2020-05-31: qty 1

## 2020-05-31 NOTE — ED Provider Notes (Signed)
I personally evaluated the patient during the encounter and completed a history, physical, procedures, medical decision making to contribute to the overall care of the patient and decision making for the patient briefly, the patient is a 87 y.o. female here for mechanical fall.  Pain mostly in the hips.  Will get CT imaging and x-rays of extremities.  Check basic labs.Exam unremarkable. Neuro intact. Bony tenderness diffusely in lower legs with no deformities. Overall she appears well.  Will get imaging, if okay, safe for d/c. See PA note for further results and eval and dispo.  This chart was dictated using voice recognition software.  Despite best efforts to proofread,  errors can occur which can change the documentation meaning.     EKG Interpretation  Date/Time:  Thursday May 31 2020 12:58:17 EDT Ventricular Rate:  84 PR Interval:  199 QRS Duration: 78 QT Interval:  366 QTC Calculation: 433 R Axis:   -22 Text Interpretation: Sinus rhythm Abnormal R-wave progression, early transition Left ventricular hypertrophy Confirmed by Lockie Mola, Edia Pursifull ([id number]) on 05/31/2020 2:51:24 PM           Virgina Norfolk, DO 05/31/20 1550

## 2020-05-31 NOTE — ED Notes (Signed)
[facility] contacted to transport pt back to [facility]. D/c instructions reviewed with pt and daughter.

## 2020-05-31 NOTE — ED Notes (Signed)
PT IN CT SCAN, WILL GET VITALS ON RETURN

## 2020-05-31 NOTE — ED Triage Notes (Signed)
unwittnessed fall at rehab. Pt reports she remembers trying to kill a bug on the floor, then falling over and hitting her head and L hip and knee. Pt ambulatory at scene with help of staff. Denies loc or use of anticoagulants.

## 2020-05-31 NOTE — Discharge Instructions (Addendum)
Exam and imaging all look reassuring.  I suspect you have some muscular pain after the fall.  I recommend over-the-counter pain medications like ibuprofen and or Tylenol every 6 hours as needed please follow dosing on the back of bottle.  I have given you some exercises to help with your leg pain please follow.  I like you to follow-up with your PCP for further evaluation if symptoms not resolve after 1 week's time.  Come back to the emergency department if you develop chest pain, shortness of breath, severe abdominal pain, uncontrolled nausea, vomiting, diarrhea.

## 2020-05-31 NOTE — ED Notes (Addendum)
[facility] called for an update - Per [facility], pt is 7th on the list and will be picked up within 1 - 2 hours. Family at bedside and updated.

## 2020-05-31 NOTE — ED Notes (Signed)
[facility] called for update ; as per [facility] pt is 9th on list to be transported. Family and pt updated on delay. Pt awaiting dinner tray, snacks offered. Food and drink provided to daughter.

## 2020-05-31 NOTE — ED Provider Notes (Signed)
[facility]-EMERGENCY DEPT Provider Note   CSN: [id number] Arrival date & time: 05/31/20  1137     History Chief Complaint  Patient presents with  . Fall    Theresa Bauer is a 87 y.o. female.  HPI   Patient with significant medical history of dementia, GERD, glaucoma, hypertension presents after having a mechanical fall.  Patient endorses that she was at a rehab facility, and she went to step on a bug missed and fell onto the floor.  She states she fell on her left side, she states she hit her head, denies losing conscious, is not on anticoagulant.  Patient states since the fall she has been having left thigh pain and hip pain, left upper arm pain with shoulder pain, headache and neck pain.  She denies change in vision, paresthesia or weakness in the upper lower or extremities, she denies chest pain or shortness of breath, no abdominal pain.  Patient states she is unable to get up off the floor, she had to have help from the staff.  Patient denies alleviating factors.  Patient denies chest pain, shortness of breath, abdominal pain, nausea, vomit, diarrhea, worsening pedal edema.  Past Medical History:  Diagnosis Date  . Dementia [facility](HCC)    Per pt's daughter  . GERD (gastroesophageal reflux disease)   . Glaucoma   . Hallucination, visual    per pt's daughter  . Hypertension     Patient Active Problem List   Diagnosis Date Noted  . Status post right cataract extraction 04/22/2017  . Primary open angle glaucoma (POAG) of left eye, severe stage 03/03/2017  . Hallucination, visual 02/14/2017  . Acute psychosis (HCC) 02/14/2017  . Low back pain 11/08/2016  . Combined forms of age-related cataract of right eye 05/01/2016  . Osteoporosis, unspecified 09/30/2012  . Branch retinal artery occlusion, left eye 09/30/2012  . HYPERTENSION 04/05/2010  . VENOUS INSUFFICIENCY, CHRONIC 04/05/2010  . GERD 04/05/2010  . CHEST PAIN 04/05/2010    Past Surgical History:   Procedure Laterality Date  . CATARACT EXTRACTION Left   . GLAUCOMA REPAIR Left   . REPLACEMENT TOTAL KNEE BILATERAL  2007/2008     OB History    Gravida  5   Para  5   Term  5   Preterm  0   AB  0   Living        SAB      IAB      Ectopic      Multiple      Live Births              Family History  Problem Relation Age of Onset  . Heart attack Mother   . Heart attack Father   . Osteoarthritis Father     Social History   Tobacco Use  . Smoking status: Never Smoker  . Smokeless tobacco: Never Used  Vaping Use  . Vaping Use: Never used  Substance Use Topics  . Alcohol use: No    Alcohol/week: 0.0 standard drinks  . Drug use: No    Home Medications Prior to Admission medications   Medication Sig Start Date End Date Taking? Authorizing Provider  acetaminophen (TYLENOL) 500 MG tablet Take 500 mg by mouth 3 (three) times daily.   Yes [provider]  amLODipine (NORVASC) 5 MG tablet Take 5 mg by mouth daily.  08/13/18  Yes [provider]  Calcium Carbonate-Vit D-Min (CALCIUM-VITAMIN D-MINERALS) 600-800 MG-UNIT CHEW Chew 1 tablet by  mouth 2 (two) times daily.   Yes [provider]  clindamycin (CLEOCIN) 300 MG capsule Take 600 mg by mouth once as needed (dental procedure). 11/09/19  Yes [provider]  diclofenac Sodium (VOLTAREN) 1 % GEL Apply 2 g topically 2 (two) times daily. To both feet and right/left knee   Yes [provider]  dorzolamide-timolol (COSOPT) 22.3-6.8 MG/ML ophthalmic solution Place 1 drop into both eyes 2 (two) times daily.  03/11/19  Yes [provider]  ferrous sulfate 325 (65 FE) MG tablet Take 325 mg by mouth 3 (three) times a week. Monday, Wednesday, friday   Yes [provider]  furosemide (LASIX) 20 MG tablet Take 10 mg by mouth every other day. 04/11/19  Yes [provider]  gabapentin (NEURONTIN) 100 MG capsule Take 100 mg by mouth 2 (two) times daily. 05/11/20   Yes [provider]  latanoprost (XALATAN) 0.005 % ophthalmic solution Place 1 drop into both eyes daily.  05/25/19  Yes [provider]  Lidocaine 4 % PTCH Apply 1 patch topically daily. Apply 1 patch topically to the mid back every morning and remove at night   Yes [provider]  liver oil-zinc oxide (DESITIN) 40 % ointment Apply 1 application topically 2 (two) times daily. To buttocks   Yes [provider]  melatonin 5 MG TABS Take 10 mg by mouth at bedtime.   Yes [provider]  Menthol, Topical Analgesic, (BIOFREEZE) 4 % GEL Apply 1 application topically 2 (two) times daily. To the left shoulder and left hip   Yes [provider]  Multiple Vitamins-Minerals (CENTRUM SILVER 50+WOMEN PO) Take 1 tablet by mouth daily.   Yes [provider]  omeprazole (PRILOSEC) 20 MG capsule Take 20 mg by mouth daily. 02/22/20  Yes [provider]  polyethylene glycol (MIRALAX / GLYCOLAX) 17 g packet Take 17 g by mouth daily as needed for mild constipation.   Yes [provider]  senna-docusate (SENOKOT-S) 8.6-50 MG tablet Take 1 tablet by mouth at bedtime. Patient taking differently: Take 2 tablets by mouth at bedtime. 11/11/16  Yes Albertine Grates, MD  traZODone (DESYREL) 50 MG tablet Take 75 mg by mouth at bedtime. 03/11/20  Yes [provider]    Allergies    Lisinopril, Darvocet [propoxyphene n-acetaminophen], Aspirin, Brimonidine, Penicillin g, Penicillins, and Darvon [propoxyphene]  Review of Systems   Review of Systems  Constitutional: Negative for chills and fever.  HENT: Negative for congestion and sore throat.   Respiratory: Negative for shortness of breath.   Cardiovascular: Negative for chest pain.  Gastrointestinal: Negative for abdominal pain, nausea and vomiting.  Genitourinary: Negative for enuresis.  Musculoskeletal: Positive for neck pain. Negative for back pain.       Left shoulder, left humerus, left  hip, left thigh pain.  Skin: Negative for rash.  Neurological: Positive for headaches. Negative for dizziness.  Hematological: Does not bruise/bleed easily.    Physical Exam Updated Vital Signs BP (!) 154/66   Pulse 82   Temp 98.8 F (37.1 C) (Oral)   Resp 18   Wt 62 kg   SpO2 98%   BMI 26.69 kg/m   Physical Exam Vitals and nursing note reviewed.  Constitutional:      General: She is not in acute distress.    Appearance: She is not ill-appearing.  HENT:     Head: Normocephalic and atraumatic.     Nose: No congestion.  Eyes:     Conjunctiva/sclera: Conjunctivae  normal.  Cardiovascular:     Rate and Rhythm: Normal rate and regular rhythm.     Pulses: Normal pulses.     Heart sounds: No murmur heard. No friction rub. No gallop.      Comments: Ribs were palpated they are nontender to palpation Pulmonary:     Effort: No respiratory distress.     Breath sounds: No wheezing, rhonchi or rales.  Abdominal:     Palpations: Abdomen is soft.     Tenderness: There is no abdominal tenderness.  Musculoskeletal:     Cervical back: Tenderness present. No rigidity.     Comments: Patient spine was palpated she was notably tender along her C-spine as well as her lumbar spine, there is no step-off or deformities present.  Patient is moving all 4 extremities.  Patient lower extremities were visualized there is no internal or external rotation, no leg shortening present, patient was notably tender along the lateral aspect of her left humerus, no deformities present, she is also tender along her left hip on her trochanter, no deformities present, she has full range of motion at her toes ankle knees and hips.  Patient's left arm was visualized there is no deformities present, she is notably tender on the dorsal aspect of her humerus, no deformities present, patient is full range of motion at her fingers, wrists, elbows, shoulder.  Skin:    General: Skin is warm and dry.  Neurological:      Mental Status: She is alert.  Psychiatric:        Mood and Affect: Mood normal.     ED Results / Procedures / Treatments   Labs (all labs ordered are listed, but only abnormal results are displayed) Labs Reviewed  CBC WITH DIFFERENTIAL/PLATELET - Abnormal; Notable for the following components:      Result Value   Hemoglobin 11.6 (*)    MCV 75.8 (*)    MCH 24.4 (*)    All other components within normal limits  BASIC METABOLIC PANEL    EKG EKG Interpretation  Date/Time:  Thursday May 31 2020 12:58:17 EDT Ventricular Rate:  84 PR Interval:  199 QRS Duration: 78 QT Interval:  366 QTC Calculation: 433 R Axis:   -22 Text Interpretation: Sinus rhythm Abnormal R-wave progression, early transition Left ventricular hypertrophy Confirmed by Virgina Norfolk ([id number]) on 05/31/2020 2:51:24 PM   Radiology DG Chest 1 View  Result Date: 05/31/2020 CLINICAL DATA:  Unwitnessed fall EXAM: CHEST  1 VIEW COMPARISON:  Chest radiograph 07/07/2019 FINDINGS: Unchanged cardiomediastinal silhouette. There is no new focal airspace disease. No pleural effusion or pneumothorax. Skin fold overlying the right apex. There is no acute osseous abnormality identified. IMPRESSION: No evidence of acute cardiopulmonary disease. Electronically Signed   By: Caprice Renshaw   On: 05/31/2020 14:15   CT Head Wo Contrast  Result Date: 05/31/2020 CLINICAL DATA:  Unwitnessed fall EXAM: CT HEAD WITHOUT CONTRAST TECHNIQUE: Contiguous axial images were obtained from the base of the skull through the vertex without intravenous contrast. COMPARISON:  07/07/2019 FINDINGS: Brain: There is no acute intracranial hemorrhage, mass effect, or edema. Gray-white differentiation is preserved. There is no extra-axial fluid collection. Prominence of the ventricles and sulci reflects generalized parenchymal volume loss. Patchy and confluent areas of hypoattenuation in the supratentorial white matter are nonspecific but may reflect moderate chronic  microvascular ischemic changes. Appearance is similar to the prior study. Vascular: There is atherosclerotic calcification at the skull base. Skull: Calvarium is unremarkable. Sinuses/Orbits: No acute finding. Other:  None. IMPRESSION: No evidence of acute intracranial injury. Stable chronic/nonemergent findings detailed above. Electronically Signed   By: Guadlupe Spanish M.D.   On: 05/31/2020 14:01   CT Cervical Spine Wo Contrast  Result Date: 05/31/2020 CLINICAL DATA:  Unwitnessed fall EXAM: CT CERVICAL SPINE WITHOUT CONTRAST TECHNIQUE: Multidetector CT imaging of the cervical spine was performed without intravenous contrast. Multiplanar CT image reconstructions were also generated. COMPARISON:  None. FINDINGS: Alignment: Mild retrolisthesis at C3-C4. Skull base and vertebrae: No acute cervical spine fracture. Multilevel degenerative endplate changes. Soft tissues and spinal canal: No prevertebral fluid or swelling. No visible canal hematoma. Disc levels: Multilevel degenerative changes are present including disc space narrowing, endplate osteophytes, and facet and uncovertebral hypertrophy. No high-grade osseous encroachment on the spinal canal. Upper chest: No apical lung mass. Other: Mild calcified plaque at the common carotid bifurcations. IMPRESSION: No acute cervical spine fracture. Electronically Signed   By: Guadlupe Spanish M.D.   On: 05/31/2020 14:11   CT Lumbar Spine Wo Contrast  Result Date: 05/31/2020 CLINICAL DATA:  Unwitnessed fall EXAM: CT LUMBAR SPINE WITHOUT CONTRAST TECHNIQUE: Multidetector CT imaging of the lumbar spine was performed without intravenous contrast administration. Multiplanar CT image reconstructions were also generated. COMPARISON:  07/07/2019 FINDINGS: Segmentation: 5 lumbar type vertebrae. Alignment: Grade 1 anterolisthesis at L4-L5. Vertebrae: New compression deformity of L1 with up to 50% loss of height. This is likely chronic and due to a fracture present on the prior  study. No acute fracture. Paraspinal and other soft tissues: Small calcified uterine fibroids. Aortic atherosclerosis. Prominence of the bilateral ureters probably due to partially imaged distended bladder. Disc levels: Multilevel degenerative changes are present with disc space narrowing, vacuum phenomenon, endplate osteophytes, and facet hypertrophy with ligamentum flavum thickening. Appearance is not substantially changed since the prior study. Canal narrowing is greatest at L4-L5. There is multilevel foraminal stenosis. IMPRESSION: No acute fracture. L1 compression fracture appears chronic. Advanced multilevel degenerative changes. Electronically Signed   By: Guadlupe Spanish M.D.   On: 05/31/2020 13:56   DG Shoulder Left  Result Date: 05/31/2020 CLINICAL DATA:  shoulder pain EXAM: LEFT SHOULDER - 2+ VIEW COMPARISON:  None. FINDINGS: There is no evidence of fracture. There is mild glenohumeral and AC joint degenerative change. IMPRESSION: No acute fracture or dislocation. Electronically Signed   By: Caprice Renshaw   On: 05/31/2020 14:02   DG Knee Complete 4 Views Left  Result Date: 05/31/2020 CLINICAL DATA:  Pain, unwitnessed fall at rehab. EXAM: LEFT KNEE - COMPLETE 4+ VIEW COMPARISON:  None. FINDINGS: Left knee arthroplasty in expected alignment. No periprosthetic lucency or fracture. There has been patellar resurfacing. Small quadriceps and patellar tendon enthesophytes. No joint effusion. IMPRESSION: Left knee arthroplasty in expected alignment. No acute or periprosthetic fracture. Electronically Signed   By: Narda Rutherford M.D.   On: 05/31/2020 16:20   DG Knee Complete 4 Views Right  Result Date: 05/31/2020 CLINICAL DATA:  Knee pain after a fall. EXAM: RIGHT KNEE - COMPLETE 4+ VIEW COMPARISON:  Tibia and fibula radiographs dated 07/10/2019. FINDINGS: The patient is status post a right knee arthroplasty. The hardware appears intact and well aligned. Chronic deformity/healed fracture of the femur is  partially imaged. No evidence of fracture, dislocation, or joint effusion. Soft tissues are unremarkable. IMPRESSION: No acute osseous injury. Electronically Signed   By: Romona Curls M.D.   On: 05/31/2020 16:23   DG Humerus Left  Result Date: 05/31/2020 CLINICAL DATA:  shoulder pain EXAM: LEFT HUMERUS - 2+ VIEW  COMPARISON:  Left humerus radiograph 09/09/2016 FINDINGS: There is no acute fracture of the left humerus. There is mild glenohumeral, AC joint, and elbow joint degenerative change. IMPRESSION: No acute fracture of the left humerus. Electronically Signed   By: Caprice Renshaw   On: 05/31/2020 14:08   DG Foot Complete Left  Result Date: 05/31/2020 CLINICAL DATA:  Pain after a fall. EXAM: LEFT FOOT - COMPLETE 3+ VIEW COMPARISON:  None. FINDINGS: There is no evidence of fracture or dislocation. Mild degenerative changes are seen in the dorsal aspect of the midfoot. A small posterior calcaneal enthesophyte is noted. There is diffuse osseous demineralization. IMPRESSION: No acute osseous injury. Electronically Signed   By: Romona Curls M.D.   On: 05/31/2020 16:21   DG Foot Complete Right  Result Date: 05/31/2020 CLINICAL DATA:  Pain, unwitnessed fall at rehab. EXAM: RIGHT FOOT COMPLETE - 3+ VIEW COMPARISON:  07/10/2019 FINDINGS: There is no evidence of fracture or dislocation. Osteoarthritis of the first metatarsal phalangeal joint. Mild midfoot degenerative change. Bones are diffusely under mineralized. Small plantar calcaneal spur. No focal soft tissue abnormality. IMPRESSION: 1. No fracture or subluxation of the right foot. 2. Mild degenerative change. Electronically Signed   By: Narda Rutherford M.D.   On: 05/31/2020 16:19   DG Hip Unilat With Pelvis 2-3 Views Left  Result Date: 05/31/2020 CLINICAL DATA:  Unwitnessed fall and rehab EXAM: DG HIP (WITH OR WITHOUT PELVIS) 2-3V LEFT COMPARISON:  Hip radiograph 07/07/2019 FINDINGS: There is no evidence of acute hip fracture or dislocation. There is  mild bilateral hip degenerative change. Unchanged soft tissue calcifications. IMPRESSION: No evidence of acute left hip fracture. Electronically Signed   By: Caprice Renshaw   On: 05/31/2020 14:11   DG Femur 1V Right  Result Date: 05/31/2020 CLINICAL DATA:  Pain after a fall. EXAM: RIGHT FEMUR 1 VIEW COMPARISON:  Hip radiographs dated 07/07/2019. FINDINGS: The patient is status post total knee arthroplasty. Chronic deformity/healed fracture of the midshaft of the femur is redemonstrated. Moderate degenerative changes are seen in the right hip. There is no evidence of acute fracture. IMPRESSION: No evidence of acute fracture. Electronically Signed   By: Romona Curls M.D.   On: 05/31/2020 16:24   DG FEMUR MIN 2 VIEWS LEFT  Result Date: 05/31/2020 CLINICAL DATA:  Unwitnessed fall EXAM: LEFT FEMUR 2 VIEWS COMPARISON:  None. FINDINGS: There is no evidence of fracture. There is mild left hip degenerative change. Unchanged soft tissue calcifications. There is a partially visualized total knee arthroplasty without evidence of complication. IMPRESSION: No evidence of left femur fracture. Electronically Signed   By: Caprice Renshaw   On: 05/31/2020 14:12    Procedures Procedures   Medications Ordered in ED Medications  morphine 2 MG/ML injection 2 mg (2 mg Intravenous Given 05/31/20 1253)  oxyCODONE-acetaminophen (PERCOCET/ROXICET) 5-325 MG per tablet 1 tablet (1 tablet Oral Given 05/31/20 1701)    ED Course  I have reviewed the triage vital signs and the nursing notes.  Pertinent labs & imaging results that were available during my care of the patient were reviewed by me and considered in my medical decision making (see chart for details).    MDM Rules/Calculators/A&P                         Initial impression-patient presents after a mechanical fall.  She is alert, does not appear in acute distress, vital signs reassuring.  Concern for numerous orthopedic injuries, will obtain imaging,  basic lab work,  provide patient with narcotics and reassess.  Work-up-CBC shows normocytic anemia with a hemoglobin 11.6 this appears to be a baseline for patient.  BMP unremarkable.  EKG sinus without signs of ischemia no ST ovation depression noted.  X-ray of left and right foot both negative for acute findings.  X-ray of left knee and right knee both negative for acute findings.  X-ray of left and right femurs both negative for acute findings.  Left hip x-ray negative for acute findings.  Chest x-ray negative for acute findings.  CT head, C-spine, lumbar spine all negative for acute findings.  Reassessment patient was reassessed after providing her with morphine, states her pain has subsided.  She has no complaints this time.  Vital signs have remained stable.  Patient daughter both updated on findings, they are both agreeable for discharge at this time.  Rule out-I have low suspicion for intracranial head bleed or cranial fracture as there is no neurodeficits on my exam, CT imaging negative for acute findings.  Low suspicion for spinal cord abnormality or spinal fracture as spine was palpated there is no step-off deformities present, patient is moving all 4 extremities, patient has negative imaging of her C-spine as well as her lumbar spine.  Low suspicion for orthopedic injuries as all imaging has been negative for acute findings.  Low suspicion for rib fracture or pneumothorax as lung sounds were clear bilaterally, chest was nontender to palpation.  Low suspicion for intra-abdominal abnormality abdomen soft nontender to palpation.  Low suspicion for arrhythmia or ACS patient has chest pain, shortness of breath, EKG is without signs of ischemia.  Plan-  1. leg pain has improved suspect secondary due to a muscular strain, will recommend over-the-counter pain medications, heat and excise the area follow with PCP symptoms of 1 week's time.  2. Back pain has improved suspect secondary due to muscular strain will  recommend over-the-counter pain medications follow-up with PCP 1 time symptoms not fully resolved.  Vital signs have remained stable, no indication for hospital admission.  Patient discussed with attending and they agreed with assessment and plan.  Patient given at home care as well strict return precautions.  Patient verbalized that they understood agreed to said plan.   Final Clinical Impression(s) / ED Diagnoses Final diagnoses:  Fall, initial encounter  Left leg pain  Right leg pain    Rx / DC Orders ED Discharge Orders    None       Carroll Sage, PA-C 05/31/20 1702    Virgina Norfolk, DO 06/01/20 [phone]

## 2020-06-25 ENCOUNTER — Ambulatory Visit: Payer: Medicaid Other | Admitting: Podiatry

## 2020-08-07 ENCOUNTER — Ambulatory Visit (INDEPENDENT_AMBULATORY_CARE_PROVIDER_SITE_OTHER): Payer: Medicare Other | Admitting: Podiatry

## 2020-08-07 ENCOUNTER — Other Ambulatory Visit: Payer: Self-pay

## 2020-08-07 ENCOUNTER — Encounter: Payer: Self-pay | Admitting: Podiatry

## 2020-08-07 DIAGNOSIS — M79676 Pain in unspecified toe(s): Secondary | ICD-10-CM

## 2020-08-07 DIAGNOSIS — B351 Tinea unguium: Secondary | ICD-10-CM

## 2020-08-07 DIAGNOSIS — L600 Ingrowing nail: Secondary | ICD-10-CM

## 2020-08-10 NOTE — Progress Notes (Signed)
Subjective: Theresa Bauer is a pleasant 87 y.o. female patient seen today painful thick toenails that are difficult to trim. Pain interferes with ambulation. Aggravating factors include wearing enclosed shoe gear. Pain is relieved with periodic professional debridement.  She is a resident of [address] [facility] transported via facility transportation. Patient's daughter is present during today's visit.  Theresa Bauer states staff at the facility had to cut her left great toenail due to her experiencing pain. Patient denies any drainage or redness of digit. She states it felt better after staff member trimmed the toenail.  She states she hasn't seen Theresa Bauer in "a long time."  Allergies  Allergen Reactions   Lisinopril Anaphylaxis   Darvocet [Propoxyphene N-Acetaminophen] Itching   Aspirin Hives    itching   Brimonidine Itching   Penicillin G     Other reaction(s): Unknown   Penicillins Hives and Itching    Itching Has patient had a PCN reaction causing immediate rash, facial/tongue/throat swelling, SOB or lightheadedness with hypotension: no Has patient had a PCN reaction causing severe rash involving mucus membranes or skin necrosis: no Has patient had a PCN reaction that required hospitalization: no Has patient had a PCN reaction occurring within the last 10 years: no If all of the above answers are "NO", then may proceed with Cephalosporin use.    Darvon [Propoxyphene] Itching    Objective: Physical Exam  General: Theresa Bauer is a pleasant 87 y.o. African American female, in NAD. AAO x 3.   Vascular:  Capillary refill time to digits immediate b/l. Palpable pedal pulses b/l LE. Pedal hair sparse. Lower extremity skin temperature gradient within normal limits.  Dermatological:  Pedal skin with normal turgor, texture and tone b/l lower extremities No open wounds b/l lower extremities No interdigital macerations b/l lower extremities Toenails 1-5 b/l elongated, discolored,  dystrophic, thickened, crumbly with subungual debris and tenderness to dorsal palpation. Incurvated nailplate medial border(s) L hallux.  Nail border hypertrophy minimal. There is tenderness to palpation. Sign(s) of infection: no clinical signs of infection noted on examination today..  Musculoskeletal:  Normal muscle strength 5/5 to all lower extremity muscle groups bilaterally. No pain crepitus or joint limitation noted with ROM b/l. Hammertoe(s) noted to the 2-5 bilaterally. Utilizes wheelchair for mobility assistance.  Neurological:  Protective sensation intact 5/5 intact bilaterally with 10g monofilament b/l. Vibratory sensation intact b/l.  Assessment and Plan:  1. Pain due to onychomycosis of toenail   2. Ingrown toenail without infection    -Examined patient. -Patient to continue soft, supportive shoe gear daily. -Facility to continue pressure precautions with heel protectors when she is in bed. -Toenails 1-5 b/l were debrided in length and girth with sterile nail nippers and dremel without iatrogenic bleeding.  -Offending nail border debrided and curretaged L hallux utilizing sterile nail nipper and currette. Border cleansed with alcohol and triple antibiotic applied. No further treatment required by patient/caregiver. -Patient to report any pedal injuries to medical professional immediately. -Patient/POA to call should there be question/concern in the interim.  Return in about 3 months (around 11/07/2020).  Freddie Breech, DPM

## 2020-11-12 ENCOUNTER — Other Ambulatory Visit: Payer: Self-pay

## 2020-11-12 ENCOUNTER — Ambulatory Visit (INDEPENDENT_AMBULATORY_CARE_PROVIDER_SITE_OTHER): Payer: Medicare Other | Admitting: Podiatry

## 2020-11-12 ENCOUNTER — Encounter: Payer: Self-pay | Admitting: Podiatry

## 2020-11-12 DIAGNOSIS — B351 Tinea unguium: Secondary | ICD-10-CM | POA: Diagnosis not present

## 2020-11-12 DIAGNOSIS — M79676 Pain in unspecified toe(s): Secondary | ICD-10-CM | POA: Diagnosis not present

## 2020-11-16 NOTE — Progress Notes (Signed)
  Subjective:  Patient ID: Theresa Bauer, female    DOB: 07-26-1929,  MRN: [id number]  Theresa Bauer presents to clinic today for thick, elongated toenails b/l feet which are tender when wearing enclosed shoe gear.  Her daughter is present during today's visit. Theresa Bauer still resides at [facility].  They voice no new pedal problems on today's visit.  Patient c/o heel pain of both heels. She was given heel protectors in June, 2021. Daughter states Theresa Bauer tries to remain independent at the facility. She bathes herself and dresses herself. However, she cannot apply moisturizer to her feet.   Allergies  Allergen Reactions   Lisinopril Anaphylaxis   Darvocet [Propoxyphene N-Acetaminophen] Itching   Aspirin Hives    itching   Brimonidine Itching   Penicillin G     Other reaction(s): Unknown   Penicillins Hives and Itching    Itching Has patient had a PCN reaction causing immediate rash, facial/tongue/throat swelling, SOB or lightheadedness with hypotension: no Has patient had a PCN reaction causing severe rash involving mucus membranes or skin necrosis: no Has patient had a PCN reaction that required hospitalization: no Has patient had a PCN reaction occurring within the last 10 years: no If all of the above answers are "NO", then may proceed with Cephalosporin use.    Darvon [Propoxyphene] Itching    Review of Systems: Negative except as noted in the HPI. Objective:   Constitutional Theresa Bauer is a pleasant 87 y.o. African American female, WD, WN in NAD. AAO x 3.   Vascular Capillary refill time to digits immediate b/l. Palpable DP pulse(s) b/l lower extremities Faintly palpable PT pulse(s) b/l lower extremities Pedal hair sparse. Lower extremity skin temperature gradient within normal limits. No pain with calf compression b/l. Trace edema noted b/l lower extremities. No cyanosis or clubbing noted.  Neurologic Normal speech. Oriented to person, place, and time. Protective  sensation intact 5/5 intact bilaterally with 10g monofilament b/l. Vibratory sensation intact b/l.  Dermatologic Skin warm and supple b/l lower extremities. No open wounds b/l LE. No interdigital macerations b/l lower extremities. Toenails 1-5 b/l elongated, discolored, dystrophic, thickened, crumbly with subungual debris and tenderness to dorsal palpation. Pedal skin dry and cracked posterior aspect of heel b/l feet. No signs of infection nor deep tissue injury noted.  Orthopedic: Noted disuse atrophy b/l lower extremities. Hammertoe(s) noted to the b/l lower extremities. Utilizes wheelchair for mobility assistance.   Radiographs: None Assessment:   1. Pain due to onychomycosis of toenail    Plan:  Patient was evaluated and treated and all questions answered. Consent given for treatment as described below: -Examined patient. -Advised patient to let facility staff assist her with applying moisturizer to her feet daily as per orders written on previous visit. She related understanding. -Patient to continue soft, supportive shoe gear daily. -Toenails 1-5 b/l were debrided in length and girth with sterile nail nippers and dremel without iatrogenic bleeding.  -Patient to report any pedal injuries to medical professional immediately. -Patient/POA to call should there be question/concern in the interim.  Return in about 3 months (around 02/12/2021).  Freddie Breech, DPM

## 2021-02-25 ENCOUNTER — Ambulatory Visit (INDEPENDENT_AMBULATORY_CARE_PROVIDER_SITE_OTHER): Payer: Medicare Other | Admitting: Podiatry

## 2021-02-25 ENCOUNTER — Encounter: Payer: Self-pay | Admitting: Podiatry

## 2021-02-25 ENCOUNTER — Other Ambulatory Visit: Payer: Self-pay

## 2021-02-25 DIAGNOSIS — M79676 Pain in unspecified toe(s): Secondary | ICD-10-CM | POA: Diagnosis not present

## 2021-02-25 DIAGNOSIS — B351 Tinea unguium: Secondary | ICD-10-CM | POA: Diagnosis not present

## 2021-03-03 NOTE — Progress Notes (Signed)
Subjective: Theresa Bauer is a 88 y.o. female patient seen today for follow up of  painful elongated mycotic toenails 1-5 bilaterally which are tender when wearing enclosed shoe gear. Pain is relieved with periodic professional debridement..   Patient is accompanied by her daughter on today's visit.   New problem(s)/concern(s) today: None    PCP is Fleet Contras, MD.   Allergies  Allergen Reactions   Lisinopril Anaphylaxis   Darvocet [Propoxyphene N-Acetaminophen] Itching   Aspirin Hives    itching   Brimonidine Itching   Penicillin G     Other reaction(s): Unknown   Penicillins Hives and Itching    Itching Has patient had a PCN reaction causing immediate rash, facial/tongue/throat swelling, SOB or lightheadedness with hypotension: no Has patient had a PCN reaction causing severe rash involving mucus membranes or skin necrosis: no Has patient had a PCN reaction that required hospitalization: no Has patient had a PCN reaction occurring within the last 10 years: no If all of the above answers are "NO", then may proceed with Cephalosporin use.    Darvon [Propoxyphene] Itching    Objective: Physical Exam  General: Patient is a pleasant 88 y.o. African American female WD, WN in NAD. AAO x 3.   Neurovascular Examination: Capillary refill time to digits immediate b/l. Palpable DP pulse(s) b/l LE. Faintly palpable PT pulse(s) b/l LE. Pedal hair sparse. No pain with calf compression b/l. Trace edema noted BLE. No ischemia or gangrene noted b/l LE. No cyanosis or clubbing noted b/l LE.  Protective sensation intact 5/5 intact bilaterally with 10g monofilament b/l.  Dermatological:  No open wounds b/l LE. No interdigital macerations noted b/l LE. Toenails 1-5 b/l elongated, discolored, dystrophic, thickened, crumbly with subungual debris and tenderness to dorsal palpation. Pedal skin noted to be dry posterior aspect of heel(s) b/l feet.  Musculoskeletal:  Noted disuse atrophy  bilaterally. Hammertoe deformity noted 2-5 b/l. Utilizes wheelchair for mobility assistance.  Assessment: 1. Pain due to onychomycosis of toenail    Plan: Patient was evaluated and treated and all questions answered. Consent given for treatment as described below: -Order written on last visit for facility staff to assist her with applying moisturizer to her feet. Order is on chart at [facility]. -Mycotic toenails 1-5 bilaterally were debrided in length and girth with sterile nail nippers and dremel without incident. -Patient/POA to call should there be question/concern in the interim.  Return in about 3 months (around 05/26/2021).  Freddie Breech, DPM

## 2021-05-27 ENCOUNTER — Encounter: Payer: Self-pay | Admitting: Podiatry

## 2021-05-27 ENCOUNTER — Ambulatory Visit (INDEPENDENT_AMBULATORY_CARE_PROVIDER_SITE_OTHER): Payer: Medicare Other | Admitting: Podiatry

## 2021-05-27 DIAGNOSIS — M7989 Other specified soft tissue disorders: Secondary | ICD-10-CM | POA: Diagnosis not present

## 2021-05-27 DIAGNOSIS — B351 Tinea unguium: Secondary | ICD-10-CM | POA: Diagnosis not present

## 2021-05-27 DIAGNOSIS — M79676 Pain in unspecified toe(s): Secondary | ICD-10-CM

## 2021-06-03 NOTE — Progress Notes (Signed)
Subjective: ?Theresa Bauer is a 88 y.o. female patient seen today for follow up of  painful elongated mycotic toenails 1-5 bilaterally which are tender when wearing enclosed shoe gear. Pain is relieved with periodic professional debridement..  ? ?Patient is accompanied by her daughter on today's visit.  ? ?New problem(s)/concern(s) today: Palpable mass on lateral aspect upper right leg. Patient states it has been present for about several weeks. Denies any episodes of trauma. Slightly painful. ? ?PCP is Theresa Ebbs, MD.  ? ?Allergies  ?Allergen Reactions  ? Lisinopril Anaphylaxis  ? Darvocet [Propoxyphene N-Acetaminophen] Itching  ? Aspirin Hives  ?  itching  ? Brimonidine Itching  ? Penicillin G   ?  Other reaction(s): Unknown  ? Penicillins Hives and Itching  ?  Itching ?Has patient had a PCN reaction causing immediate rash, facial/tongue/throat swelling, SOB or lightheadedness with hypotension: no ?Has patient had a PCN reaction causing severe rash involving mucus membranes or skin necrosis: no ?Has patient had a PCN reaction that required hospitalization: no ?Has patient had a PCN reaction occurring within the last 10 years: no ?If all of the above answers are "NO", then may proceed with Cephalosporin use. ?  ? Darvon [Propoxyphene] Itching  ? ? ?Objective: ?Physical Exam ? ?General: Patient is a pleasant 88 y.o. African American female WD, WN in NAD. AAO x 3.  ? ?Neurovascular Examination: ?Capillary refill time to digits immediate b/l. Palpable DP pulse(s) b/l LE. Faintly palpable PT pulse(s) b/l LE. Pedal hair sparse. No pain with calf compression b/l. Trace edema noted BLE. No ischemia or gangrene noted b/l LE. No cyanosis or clubbing noted b/l LE. ? ?Protective sensation intact 5/5 intact bilaterally with 10g monofilament b/l. ? ?Dermatological:  ?No open wounds b/l LE. No interdigital macerations noted b/l LE. Toenails 1-5 b/l elongated, discolored, dystrophic, thickened, crumbly with subungual debris and  tenderness to dorsal palpation. Pedal skin noted to be dry posterior aspect of heel(s) b/l feet. ? ?Musculoskeletal:  ?Noted disuse atrophy bilaterally. Hammertoe deformity noted 2-5 b/l. Utilizes wheelchair for mobility assistance. She does have nonpulsatile palpable soft tissue mass on the lateral aspect of right leg. No erythema, no warmth, no surrounding edema. ? ?Assessment: ?1. Pain due to onychomycosis of toenail   ?2. Mass of soft tissue of lower leg   ? ?Plan: ?Patient was evaluated and treated and all questions answered. ?Consent given for treatment as described below: ?-Order written for MD to assess soft tissue mass lateral aspect RLE ?-Order written on a previous visit for facility staff to assist her with applying moisturizer to her feet. Order is on chart at [facility]. ?-Mycotic toenails 1-5 bilaterally were debrided in length and girth with sterile nail nippers and dremel without incident. ?-Patient/POA to call should there be question/concern in the interim. ? ?Return in about 3 months (around 08/26/2021). ? ?Marzetta Board, DPM ?

## 2021-09-02 ENCOUNTER — Ambulatory Visit (INDEPENDENT_AMBULATORY_CARE_PROVIDER_SITE_OTHER): Payer: Medicare Other | Admitting: Podiatry

## 2021-09-02 ENCOUNTER — Encounter: Payer: Self-pay | Admitting: Podiatry

## 2021-09-02 DIAGNOSIS — M79676 Pain in unspecified toe(s): Secondary | ICD-10-CM

## 2021-09-02 DIAGNOSIS — B351 Tinea unguium: Secondary | ICD-10-CM | POA: Diagnosis not present

## 2021-09-06 NOTE — Progress Notes (Signed)
  Subjective:  Patient ID: Theresa Bauer, female    DOB: 06-17-1929,  MRN: [id number]  Theresa Bauer presents to clinic today for painful elongated mycotic toenails 1-5 bilaterally which are tender when wearing enclosed shoe gear. Pain is relieved with periodic professional debridement.  Theresa Bauer is a resident of [facility].  Her daughter is present during today's visit.  New problem(s): None.   PCP is Karna Dupes, MD , and last visit was  August 26, 2021  Allergies  Allergen Reactions   Lisinopril Anaphylaxis   Darvocet [Propoxyphene N-Acetaminophen] Itching   Aspirin Hives    itching   Brimonidine Itching   Penicillin G     Other reaction(s): Unknown   Penicillins Hives and Itching    Itching Has patient had a PCN reaction causing immediate rash, facial/tongue/throat swelling, SOB or lightheadedness with hypotension: no Has patient had a PCN reaction causing severe rash involving mucus membranes or skin necrosis: no Has patient had a PCN reaction that required hospitalization: no Has patient had a PCN reaction occurring within the last 10 years: no If all of the above answers are "NO", then may proceed with Cephalosporin use.    Darvon [Propoxyphene] Itching    Review of Systems: Negative except as noted in the HPI.  Objective: No changes noted in today's physical examination. General: Patient is a pleasant 88 y.o. African American female WD, WN in NAD. AAO x 3.   Neurovascular Examination: Capillary refill time to digits immediate b/l. Palpable DP pulse(s) b/l LE. Faintly palpable PT pulse(s) b/l LE. Pedal hair sparse. No pain with calf compression b/l. Trace edema noted BLE. No ischemia or gangrene noted b/l LE. No cyanosis or clubbing noted b/l LE.  Protective sensation intact 5/5 intact bilaterally with 10g monofilament b/l.  Dermatological:  No open wounds b/l LE. No pressure injuries. No interdigital macerations noted b/l LE. Toenails 1-5 b/l elongated,  discolored, dystrophic, thickened, crumbly with subungual debris and tenderness to dorsal palpation.   Musculoskeletal:  Muscle strength 4/5 b/l. Hammertoe deformity noted 2-5 b/l. Utilizes wheelchair for mobility assistance. She does have nonpulsatile palpable soft tissue mass on the lateral aspect of right leg. No erythema, no warmth, no surrounding edema.  Assessment/Plan: 1. Pain due to onychomycosis of toenail   -Patient was evaluated and treated. All patient's and/or POA's questions/concerns answered on today's visit. -Facility to continue fall precautions. -Facility to continue pressure precautions.  -Facility to [employer] on bath/shower days. -Toenails 1-5 b/l were debrided in length and girth with sterile nail nippers and dremel without iatrogenic bleeding.  -Patient/POA to call should there be question/concern in the interim.   Return in about 3 months (around 12/03/2021).  Freddie Breech, DPM

## 2021-12-16 ENCOUNTER — Encounter: Payer: Self-pay | Admitting: Podiatry

## 2021-12-16 ENCOUNTER — Ambulatory Visit (INDEPENDENT_AMBULATORY_CARE_PROVIDER_SITE_OTHER): Payer: Medicare Other | Admitting: Podiatry

## 2021-12-16 DIAGNOSIS — B351 Tinea unguium: Secondary | ICD-10-CM | POA: Diagnosis not present

## 2021-12-16 DIAGNOSIS — M79676 Pain in unspecified toe(s): Secondary | ICD-10-CM | POA: Diagnosis not present

## 2021-12-16 NOTE — Progress Notes (Signed)
  Subjective:  Patient ID: Theresa Bauer, female    DOB: 03-18-1929,  MRN: [id number]  Theresa Bauer presents to clinic today for painful thick toenails that are difficult to trim. Pain interferes with ambulation. Aggravating factors include wearing enclosed shoe gear. Pain is relieved with periodic professional debridement. She is a resident of [address]. She is accompanied by her daughter on today's visit. Chief Complaint  Patient presents with   Nail Problem    Routine foot care PCP-Khashana Blake PCP VST-Last week   New problem(s): None.   PCP is Karna Dupes, MD.  Allergies  Allergen Reactions   Lisinopril Anaphylaxis   Darvocet [Propoxyphene N-Acetaminophen] Itching   Aspirin Hives    itching   Brimonidine Itching   Penicillin G     Other reaction(s): Unknown   Penicillins Hives and Itching    Itching Has patient had a PCN reaction causing immediate rash, facial/tongue/throat swelling, SOB or lightheadedness with hypotension: no Has patient had a PCN reaction causing severe rash involving mucus membranes or skin necrosis: no Has patient had a PCN reaction that required hospitalization: no Has patient had a PCN reaction occurring within the last 10 years: no If all of the above answers are "NO", then may proceed with Cephalosporin use.    Darvon [Propoxyphene] Itching    Review of Systems: Negative except as noted in the HPI.  Objective: No changes noted in today's physical examination.  Theresa Bauer is a pleasant 88 y.o. female WD, WN in NAD. AAO x 3.  Neurovascular Examination: Capillary refill time to digits immediate b/l. Palpable DP pulse(s) b/l LE. Faintly palpable PT pulse(s) b/l LE. Pedal hair sparse. No pain with calf compression b/l. Trace edema noted BLE. No ischemia or gangrene noted b/l LE. No cyanosis or clubbing noted b/l LE.  Protective sensation intact 5/5 intact bilaterally with 10g monofilament b/l.  Dermatological:  No open wounds  b/l LE. No pressure injuries. No interdigital macerations noted b/l LE. Toenails 1-5 b/l elongated, discolored, dystrophic, thickened, crumbly with subungual debris and tenderness to dorsal palpation.   Musculoskeletal:  Muscle strength 4/5 b/l. Hammertoe deformity noted 2-5 b/l. Utilizes wheelchair for mobility assistance. She does have nonpulsatile palpable soft tissue mass on the lateral aspect of right leg. No erythema, no warmth, no surrounding edema.  Assessment/Plan: 1. Pain due to onychomycosis of toenail     No orders of the defined types were placed in this encounter.   -Patient's family member present. All questions/concerns addressed on today's visit. -Facility to continue fall precautions and pressure precautions. -Facility to apply moisturizer to patient's feet on bath/shower days. -Mycotic toenails 1-5 bilaterally were debrided in length and girth with sterile nail nippers and dremel without incident. -Patient/POA to call should there be question/concern in the interim.   Return in about 3 months (around 03/18/2022).  Freddie Breech, DPM

## 2022-04-09 ENCOUNTER — Ambulatory Visit: Payer: 59 | Admitting: Podiatry

## 2022-04-09 ENCOUNTER — Ambulatory Visit (INDEPENDENT_AMBULATORY_CARE_PROVIDER_SITE_OTHER): Payer: 59 | Admitting: Podiatry

## 2022-04-09 DIAGNOSIS — M79676 Pain in unspecified toe(s): Secondary | ICD-10-CM

## 2022-04-09 DIAGNOSIS — B351 Tinea unguium: Secondary | ICD-10-CM | POA: Diagnosis not present

## 2022-04-10 NOTE — Progress Notes (Signed)
  Subjective:  Patient ID: Theresa Bauer, female    DOB: 1929/06/09,  MRN: [id number]  Chief Complaint  Patient presents with   Nail Problem    Thick painful toenails, 3 month follow up    89 y.o. female presents with the above complaint. History confirmed with patient.   Objective:  Physical Exam: warm, good capillary refill, no trophic changes or ulcerative lesions, normal DP and PT pulses, and normal sensory exam. Left Foot: dystrophic yellowed discolored nail plates with subungual debris Right Foot: dystrophic yellowed discolored nail plates with subungual debris  Assessment:   1. Pain due to onychomycosis of toenail      Plan:  Patient was evaluated and treated and all questions answered.  Discussed the etiology and treatment options for the condition in detail with the patient. Educated patient on the topical and oral treatment options for mycotic nails. Recommended debridement of the nails today. Sharp and mechanical debridement performed of all painful and mycotic nails today. Nails debrided in length and thickness using a nail nipper to level of comfort. Discussed treatment options including appropriate shoe gear. Follow up as needed for painful nails.    Return in about 3 months (around 07/10/2022) for painful thick fungal nails.

## 2022-06-17 ENCOUNTER — Ambulatory Visit (INDEPENDENT_AMBULATORY_CARE_PROVIDER_SITE_OTHER): Payer: 59 | Admitting: Podiatry

## 2022-06-17 DIAGNOSIS — M79676 Pain in unspecified toe(s): Secondary | ICD-10-CM | POA: Diagnosis not present

## 2022-06-17 DIAGNOSIS — B351 Tinea unguium: Secondary | ICD-10-CM

## 2022-06-17 NOTE — Progress Notes (Signed)
  Subjective:  Patient ID: Theresa Bauer, female    DOB: 1929-12-01,  MRN: [id number]  Theresa Bauer presents to clinic today from [facility] for painful elongated mycotic toenails 1-5 bilaterally which are tender when wearing enclosed shoe gear. Pain is relieved with periodic professional debridement. She is accompanied by her two daughters on today's visit. Daughter, Theresa Bauer, is visiting from [place]. Chief Complaint  Patient presents with   Nail Problem    [facility] PCP-Blake PCP VST-Last week   New problem(s): None.   PCP is Karna Dupes, MD.  Allergies  Allergen Reactions   Lisinopril Anaphylaxis   Darvocet [Propoxyphene N-Acetaminophen] Itching   Aspirin Hives    itching   Brimonidine Itching   Penicillin G     Other reaction(s): Unknown   Penicillins Hives and Itching    Itching Has patient had a PCN reaction causing immediate rash, facial/tongue/throat swelling, SOB or lightheadedness with hypotension: no Has patient had a PCN reaction causing severe rash involving mucus membranes or skin necrosis: no Has patient had a PCN reaction that required hospitalization: no Has patient had a PCN reaction occurring within the last 10 years: no If all of the above answers are "NO", then may proceed with Cephalosporin use.    Darvon [Propoxyphene] Itching    Review of Systems: Negative except as noted in the HPI.  Objective:  There were no vitals filed for this visit. Theresa Bauer is a pleasant 89 y.o. female WD, WN in NAD. AAO x 3.  Neurovascular Examination: Capillary refill time to digits immediate b/l. Palpable DP pulse(s) b/l LE. Faintly palpable PT pulse(s) b/l LE. Pedal hair sparse. No pain with calf compression b/l. Trace edema noted BLE. No ischemia or gangrene noted b/l LE. No cyanosis or clubbing noted b/l LE.  Protective sensation intact 5/5 intact bilaterally with 10g monofilament b/l.  Dermatological:  No open wounds b/l LE. No pressure injuries. No  interdigital macerations noted b/l LE. Toenails 1-5 b/l elongated, discolored, dystrophic, thickened, crumbly with subungual debris and tenderness to dorsal palpation.   Musculoskeletal:  Muscle strength 4/5 b/l. Hammertoe deformity noted 2-5 b/l. Utilizes wheelchair for mobility assistance. She does have nonpulsatile palpable soft tissue mass on the lateral aspect of right leg. No erythema, no warmth, no surrounding edema.  Assessment/Plan: 1. Pain due to onychomycosis of toenail    -Patient's family members present. All questions/concerns addressed on today's visit. -No new findings. No new orders. -Facility to continue fall precautions and pressure precautions. -Toenails 1-5 b/l were debrided in length and girth with sterile nail nippers and dremel without iatrogenic bleeding.  -Patient/POA to call should there be question/concern in the interim.   Return in about 3 months (around 09/17/2022).  Freddie Breech, DPM

## 2022-06-20 ENCOUNTER — Encounter: Payer: Self-pay | Admitting: Podiatry

## 2022-07-27 ENCOUNTER — Emergency Department (HOSPITAL_COMMUNITY): Payer: 59

## 2022-07-27 ENCOUNTER — Emergency Department (HOSPITAL_COMMUNITY)
Admission: EM | Admit: 2022-07-27 | Discharge: 2022-07-27 | Disposition: A | Discharge status: Skilled Nursing Facility | Payer: 59 | Attending: Emergency Medicine | Admitting: Emergency Medicine

## 2022-07-27 ENCOUNTER — Other Ambulatory Visit: Payer: Self-pay

## 2022-07-27 DIAGNOSIS — I10 Essential (primary) hypertension: Secondary | ICD-10-CM | POA: Insufficient documentation

## 2022-07-27 DIAGNOSIS — T671XXA Heat syncope, initial encounter: Secondary | ICD-10-CM

## 2022-07-27 DIAGNOSIS — T679XXA Effect of heat and light, unspecified, initial encounter: Secondary | ICD-10-CM | POA: Insufficient documentation

## 2022-07-27 DIAGNOSIS — F0390 Unspecified dementia without behavioral disturbance: Secondary | ICD-10-CM | POA: Insufficient documentation

## 2022-07-27 DIAGNOSIS — Z79899 Other long term (current) drug therapy: Secondary | ICD-10-CM | POA: Diagnosis not present

## 2022-07-27 LAB — CBC WITH DIFFERENTIAL/PLATELET
Abs Immature Granulocytes: 0.01 10*3/uL (ref 0.00–0.07)
Basophils Absolute: 0 10*3/uL (ref 0.0–0.1)
Basophils Relative: 0 %
Eosinophils Absolute: 0.1 10*3/uL (ref 0.0–0.5)
Eosinophils Relative: 1 %
HCT: 36.7 % (ref 36.0–46.0)
Hemoglobin: 11.6 g/dL — ABNORMAL LOW (ref 12.0–15.0)
Immature Granulocytes: 0 %
Lymphocytes Relative: 40 %
Lymphs Abs: 2.2 10*3/uL (ref 0.7–4.0)
MCH: 23.6 pg — ABNORMAL LOW (ref 26.0–34.0)
MCHC: 31.6 g/dL (ref 30.0–36.0)
MCV: 74.7 fL — ABNORMAL LOW (ref 80.0–100.0)
Monocytes Absolute: 0.7 10*3/uL (ref 0.1–1.0)
Monocytes Relative: 13 %
Neutro Abs: 2.5 10*3/uL (ref 1.7–7.7)
Neutrophils Relative %: 46 %
Platelets: 191 10*3/uL (ref 150–400)
RBC: 4.91 MIL/uL (ref 3.87–5.11)
RDW: 15.4 % (ref 11.5–15.5)
WBC: 5.5 10*3/uL (ref 4.0–10.5)
nRBC: 0 % (ref 0.0–0.2)

## 2022-07-27 LAB — URINALYSIS, W/ REFLEX TO CULTURE (INFECTION SUSPECTED)
Bacteria, UA: NONE SEEN
Bilirubin Urine: NEGATIVE
Glucose, UA: NEGATIVE mg/dL
Ketones, ur: 5 mg/dL — AB
Leukocytes,Ua: NEGATIVE
Nitrite: NEGATIVE
Protein, ur: NEGATIVE mg/dL
Specific Gravity, Urine: 1.005 (ref 1.005–1.030)
pH: 7 (ref 5.0–8.0)

## 2022-07-27 LAB — COMPREHENSIVE METABOLIC PANEL
ALT: 10 U/L (ref 0–44)
AST: 14 U/L — ABNORMAL LOW (ref 15–41)
Albumin: 3.5 g/dL (ref 3.5–5.0)
Alkaline Phosphatase: 53 U/L (ref 38–126)
Anion gap: 6 (ref 5–15)
BUN: 15 mg/dL (ref 8–23)
CO2: 27 mmol/L (ref 22–32)
Calcium: 8.9 mg/dL (ref 8.9–10.3)
Chloride: 105 mmol/L (ref 98–111)
Creatinine, Ser: 0.77 mg/dL (ref 0.44–1.00)
GFR, Estimated: >60 mL/min (ref >60)
Glucose, Bld: 104 mg/dL — ABNORMAL HIGH (ref 70–99)
Potassium: 3.6 mmol/L (ref 3.5–5.1)
Sodium: 138 mmol/L (ref 135–145)
Total Bilirubin: 0.9 mg/dL (ref 0.3–1.2)
Total Protein: 7 g/dL (ref 6.5–8.1)

## 2022-07-27 MED ORDER — SODIUM CHLORIDE 0.9 % IV BOLUS
1000.0000 mL | Freq: Once | INTRAVENOUS | Status: AC
  Administered 2022-07-27: 1000 mL via INTRAVENOUS

## 2022-07-27 MED ORDER — HYDRALAZINE HCL 25 MG PO TABS
25.0000 mg | ORAL_TABLET | Freq: Once | ORAL | Status: AC
  Administered 2022-07-27: 25 mg via ORAL
  Filled 2022-07-27: qty 1

## 2022-07-27 NOTE — Discharge Instructions (Addendum)
We evaluated Theresa Bauer for her episode of fainting.  Her testing was reassuring including her head CT scan, blood work and urinalysis.  Her symptoms are most likely due to dehydration and being out in the hot sun.  Since she has been more tired than normal, I would make sure that her doctor is aware so they can change her medications if needed.  Her blood pressure was elevated in the emergency department.  I do not think this is related to her fainting episode.  I would have her primary doctor recheck her blood pressure tomorrow to see if she needs any blood pressure medication adjustment.  If she has any further episodes, especially any episodes not associated with being dehydrated or out in the sun, please bring her back to the emergency department.  Please bring her back also if she develops any new symptoms such as fevers, confusion, vomiting, cough, abdominal pain, or any other new symptoms.

## 2022-07-27 NOTE — ED Notes (Signed)
Attempted to call [facility], no answer, will try again.

## 2022-07-27 NOTE — ED Notes (Signed)
Patient and daughter given discharge instructions and follow up care. Verbalized understanding. IV removed with catheter intact. [facility] here to take patient, taken out of ED via stretcher.

## 2022-07-27 NOTE — ED Provider Notes (Signed)
[place] EMERGENCY DEPARTMENT AT [facility] Provider Note  CSN: [id number] Arrival date & time: 07/27/22 1634  Chief Complaint(s) Loss of Consciousness  HPI Theresa Bauer is a 89 y.o. female history of dementia, hypertension presenting to the emergency department with episode of fainting.  Patient daughter was with patient.  She reports that the patient slept most of the morning and did not drink any water, they were then sitting outside in the sun and then the patient slumped over for a brief period.  She was brought back inside and ice packs were applied and the patient woke back up and seemed back at baseline.  The daughter actually reports that the patient seems better than she has been over the past few weeks and more alert.  Patient reports some chronic leg pain and leg swelling, but otherwise denies any chest pain, shortness of breath, fevers or chills, headaches, painful urination.  History limited due to dementia   Past Medical History Past Medical History:  Diagnosis Date   Dementia (HCC)    Per pt's daughter   GERD (gastroesophageal reflux disease)    Glaucoma    Hallucination, visual    per pt's daughter   Hypertension    Patient Active Problem List   Diagnosis Date Noted   Status post right cataract extraction 04/22/2017   Primary open angle glaucoma (POAG) of left eye, severe stage 03/03/2017   Hallucination, visual 02/14/2017   Acute psychosis (HCC) 02/14/2017   Low back pain 11/08/2016   Combined forms of age-related cataract of right eye 05/01/2016   Osteoporosis, unspecified 09/30/2012   Branch retinal artery occlusion, left eye 09/30/2012   HYPERTENSION 04/05/2010   VENOUS INSUFFICIENCY, CHRONIC 04/05/2010   GERD 04/05/2010   CHEST PAIN 04/05/2010   Home Medication(s) Prior to Admission medications   Medication Sig Start Date End Date Taking? Authorizing Provider  acetaminophen (TYLENOL) 500 MG tablet Take 1,000 mg by mouth 2 (two) times daily.    Yes [provider]  amLODipine (NORVASC) 5 MG tablet Take 2.5 mg by mouth daily. 08/13/18  Yes [provider]  Calcium Carbonate-Vit D-Min (CALCIUM-VITAMIN D-MINERALS) 600-800 MG-UNIT CHEW Chew 1 tablet by mouth 2 (two) times daily.   Yes [provider]  Dextran 70-Hypromellose (NATURAL BALANCE TEARS) 0.1-0.3 % SOLN Place 1 drop into both eyes 2 (two) times daily as needed (dry eyes).   Yes [provider]  diclofenac Sodium (VOLTAREN) 1 % GEL Apply 2 g topically at bedtime. To both ankles and feet.   Yes [provider]  dorzolamide-timolol (COSOPT) 22.3-6.8 MG/ML ophthalmic solution Place 1 drop into both eyes 2 (two) times daily.  03/11/19  Yes [provider]  ferrous sulfate 325 (65 FE) MG tablet Take 325 mg by mouth 3 (three) times a week. Monday, Wednesday, friday   Yes [provider]  furosemide (LASIX) 20 MG tablet Take 10 mg by mouth every other day. 04/11/19  Yes [provider]  gabapentin (NEURONTIN) 100 MG capsule Take 200 mg by mouth at bedtime. 05/11/20  Yes [provider]  latanoprost (XALATAN) 0.005 % ophthalmic solution Place 1 drop into both eyes at bedtime. 05/25/19  Yes [provider]  Lidocaine 4 % PTCH Place 2 patches onto the skin daily. Apply 1 patch topically to the mid back and 1 patch to right hip every morning and remove at night   Yes [provider]  memantine (NAMENDA) 10 MG tablet Take 10 mg by mouth 2 (  two) times daily.   Yes [provider]  Menthol, Topical Analgesic, (BIOFREEZE) 4 % GEL Apply 1 application  topically 2 (two) times daily. To the left/right shoulder and left/right hip, thighs and both knees.   Yes [provider]  NON FORMULARY Apply 1 Application topically daily. To back and feet   Yes [provider]  nystatin (MYCOSTATIN) 100000 UNIT/ML suspension Take 5 mLs by mouth 4 (four) times daily.   Yes [provider]   omeprazole (PRILOSEC) 20 MG capsule Take 20 mg by mouth every other day. 02/22/20  Yes [provider]  propranolol (INDERAL) 10 MG tablet Take 10 mg by mouth 2 (two) times daily.   Yes [provider]  risperiDONE (RISPERDAL) 1 MG tablet Take 1 mg by mouth at bedtime.   Yes [provider]  senna-docusate (SENOKOT-S) 8.6-50 MG tablet Take 1 tablet by mouth at bedtime. Patient taking differently: Take 1 tablet by mouth 2 (two) times daily. 11/11/16  Yes Albertine Grates, MD  traZODone (DESYREL) 50 MG tablet Take 50 mg by mouth at bedtime.   Yes [provider]  clindamycin (CLEOCIN) 300 MG capsule Take 600 mg by mouth once as needed (dental procedure). Patient not taking: Reported on 05/27/2021 11/09/19   [provider]                                                                                                                                    Past Surgical History Past Surgical History:  Procedure Laterality Date   CATARACT EXTRACTION Left    GLAUCOMA REPAIR Left    REPLACEMENT TOTAL KNEE BILATERAL  2007/2008   Family History Family History  Problem Relation Age of Onset   Heart attack Mother    Heart attack Father    Osteoarthritis Father     Social History Social History   Tobacco Use   Smoking status: Never   Smokeless tobacco: Never  Vaping Use   Vaping Use: Never used  Substance Use Topics   Alcohol use: No    Alcohol/week: 0.0 standard drinks of alcohol   Drug use: No   Allergies Lisinopril, Darvocet [propoxyphene n-acetaminophen], Aspirin, Brimonidine, Penicillin g, Penicillins, and Darvon [propoxyphene]  Review of Systems Review of Systems  All other systems reviewed and are negative.   Physical Exam Vital Signs  I have reviewed the triage vital signs BP (!) 186/70   Pulse 77   Temp 97.8 F (36.6 C) (Oral)   Resp 20   SpO2 100%  Physical Exam Vitals and nursing note reviewed.  Constitutional:      General: She  is not in acute distress.    Appearance: She is well-developed.  HENT:     Head: Normocephalic and atraumatic.     Mouth/Throat:     Mouth: Mucous membranes are dry.  Eyes:     Pupils: Pupils are equal, round, and reactive to light.  Cardiovascular:     Rate and Rhythm: Normal rate and regular rhythm.     Heart sounds: No murmur heard. Pulmonary:     Effort: Pulmonary effort is normal. No respiratory distress.     Breath sounds: Normal breath sounds.  Abdominal:     General: Abdomen is flat.     Palpations: Abdomen is soft.     Tenderness: There is no abdominal tenderness.  Musculoskeletal:        General: No tenderness.     Right lower leg: No edema.     Left lower leg: No edema.  Skin:    General: Skin is warm and dry.  Neurological:     General: No focal deficit present.     Mental Status: She is alert. Mental status is at baseline.     Cranial Nerves: No cranial nerve deficit.     Comments: Oriented to self, moving all extremities equally, no cranial nerve deficit  Psychiatric:        Mood and Affect: Mood normal.        Behavior: Behavior normal.     ED Results and Treatments Labs (all labs ordered are listed, but only abnormal results are displayed) Labs Reviewed  COMPREHENSIVE METABOLIC PANEL - Abnormal; Notable for the following components:      Result Value   Glucose, Bld 104 (*)    AST 14 (*)    All other components within normal limits  CBC WITH DIFFERENTIAL/PLATELET - Abnormal; Notable for the following components:   Hemoglobin 11.6 (*)    MCV 74.7 (*)    MCH 23.6 (*)    All other components within normal limits  URINALYSIS, W/ REFLEX TO CULTURE (INFECTION SUSPECTED) - Abnormal; Notable for the following components:   Color, Urine COLORLESS (*)    Hgb urine dipstick SMALL (*)    Ketones, ur 5 (*)    All other components within normal limits                                                                                                                           Radiology DG Chest Port 1 View  Result Date: 07/27/2022 CLINICAL DATA:  Syncope EXAM: PORTABLE CHEST 1 VIEW COMPARISON:  05/31/2020 FINDINGS: Single frontal view of the chest demonstrates an unremarkable cardiac silhouette. No acute airspace disease, effusion, or pneumothorax. No acute bony abnormalities. IMPRESSION: 1. No acute intrathoracic process. Electronically Signed   By: Sharlet Salina M.D.   On: 07/27/2022 18:12   CT Head Wo Contrast  Result Date: 07/27/2022 CLINICAL DATA:  Headache, new onset (Age >= 51y); Neck trauma (Age >= 65y) EXAM: CT HEAD WITHOUT CONTRAST CT CERVICAL SPINE WITHOUT CONTRAST TECHNIQUE: Multidetector CT imaging of the head and cervical spine was performed following the standard protocol without intravenous contrast. Multiplanar CT image reconstructions of the cervical spine were also generated. RADIATION DOSE REDUCTION: This exam was performed according to the departmental dose-optimization program which includes automated exposure control,  adjustment of the mA and/or kV according to patient size and/or use of iterative reconstruction technique. COMPARISON:  05/31/2020 FINDINGS: CT HEAD FINDINGS Brain: No evidence of acute infarction, hemorrhage, hydrocephalus, extra-axial collection or mass lesion/mass effect. Patchy low-density changes within the periventricular and subcortical white matter most compatible with chronic microvascular ischemic change. Mild diffuse cerebral volume loss. low-density changes within the periventricular and subcortical white matter most compatible with chronic microvascular ischemic change. Mild diffuse cerebral volume loss. Vascular: Atherosclerotic calcifications involving the large vessels of the skull base. No unexpected hyperdense vessel. Skull: Normal. Negative for fracture or focal lesion. Sinuses/Orbits: Mucosal thickening within the right maxillary sinus and right ethmoid air cells. Other: None. CT CERVICAL SPINE FINDINGS Alignment:  Facet joints are aligned without dislocation or traumatic listhesis. Dens and lateral masses are aligned. Skull base and vertebrae: No acute fracture. No primary bone lesion or focal pathologic process. Soft tissues and spinal canal: No prevertebral fluid or swelling. No visible canal hematoma. Disc levels: Advanced multilevel cervical spondylosis, similar to prior. Upper chest: Negative. Other: Bilateral carotid atherosclerosis. IMPRESSION: 1. No acute intracranial abnormality. 2. No acute fracture or subluxation of the cervical spine. Electronically Signed   By: Duanne Guess D.O.   On: 07/27/2022 18:10   CT Cervical Spine Wo Contrast  Result Date: 07/27/2022 CLINICAL DATA:  Headache, new onset (Age >= 51y); Neck trauma (Age >= 65y) EXAM: CT HEAD WITHOUT CONTRAST CT CERVICAL SPINE WITHOUT CONTRAST TECHNIQUE: Multidetector CT imaging of the head and cervical spine was performed following the standard protocol without intravenous contrast. Multiplanar CT image reconstructions of the cervical spine were also generated. RADIATION DOSE REDUCTION: This exam was performed according to the departmental dose-optimization program which includes automated exposure control, adjustment of the mA and/or kV according to patient size and/or use of iterative reconstruction technique. COMPARISON:  05/31/2020 FINDINGS: CT HEAD FINDINGS Brain: No evidence of acute infarction, hemorrhage, hydrocephalus, extra-axial collection or mass lesion/mass effect. Patchy low-density changes within the periventricular and subcortical white matter most compatible with chronic microvascular ischemic change. Mild diffuse cerebral volume loss. low-density changes within the periventricular and subcortical white matter most compatible with chronic microvascular ischemic change. Mild diffuse cerebral volume loss. Vascular: Atherosclerotic calcifications involving the large vessels of the skull base. No unexpected hyperdense vessel. Skull:  Normal. Negative for fracture or focal lesion. Sinuses/Orbits: Mucosal thickening within the right maxillary sinus and right ethmoid air cells. Other: None. CT CERVICAL SPINE FINDINGS Alignment: Facet joints are aligned without dislocation or traumatic listhesis. Dens and lateral masses are aligned. Skull base and vertebrae: No acute fracture. No primary bone lesion or focal pathologic process. Soft tissues and spinal canal: No prevertebral fluid or swelling. No visible canal hematoma. Disc levels: Advanced multilevel cervical spondylosis, similar to prior. Upper chest: Negative. Other: Bilateral carotid atherosclerosis. IMPRESSION: 1. No acute intracranial abnormality. 2. No acute fracture or subluxation of the cervical spine. Electronically Signed   By: Duanne Guess D.O.   On: 07/27/2022 18:10    Pertinent labs & imaging results that were available during my care of the patient were reviewed by me and considered in my medical decision making (see MDM for details).  Medications Ordered in ED Medications  sodium chloride 0.9 % bolus 1,000 mL (1,000 mLs Intravenous New Bag/Given 07/27/22 1759)  Procedures Procedures  (including critical care time)  Medical Decision Making / ED Course   MDM:  89 year old female presenting to the emergency department with syncope.  Suspect this occurred likely in the setting of heat exposure, dehydration.  Episode was brief with no evidence of signs of seizure activity.  Laboratory testing reassuring without evidence of acute kidney injury or severe dehydration.  Less likely cardiogenic, EKG reassuring without evidence of proarrhythmic condition, prolonged QT interval, patient denies chest pain.  CT head without evidence of acute intracranial process and patient's daughter reports her mentation is actually better than it has been  over the past few weeks.  Vitals notable for hypertension, no headache or signs of endorgan dysfunction.  Given syncope, discussed with daughter, offered hospitalization for observation versus return to facility.  Patient and daughter prefer discharge, given likely causes heat exposure and dehydration will discharge.  Patient has follow-up with her nursing home physician tomorrow.  Advised blood pressure recheck and titration as needed.  Will discharge patient to home. All questions answered. Patient and daughter comfortable with plan of discharge. Return precautions discussed with patient and daughter and specified on the after visit summary.       Additional history obtained: -Additional history obtained from family and ems -External records from outside source obtained and reviewed including: Chart review including previous notes, labs, imaging, consultation notes including prior echocardiogram   Lab Tests: -I ordered, reviewed, and interpreted labs.   The pertinent results include:   Labs Reviewed  COMPREHENSIVE METABOLIC PANEL - Abnormal; Notable for the following components:      Result Value   Glucose, Bld 104 (*)    AST 14 (*)    All other components within normal limits  CBC WITH DIFFERENTIAL/PLATELET - Abnormal; Notable for the following components:   Hemoglobin 11.6 (*)    MCV 74.7 (*)    MCH 23.6 (*)    All other components within normal limits  URINALYSIS, W/ REFLEX TO CULTURE (INFECTION SUSPECTED) - Abnormal; Notable for the following components:   Color, Urine COLORLESS (*)    Hgb urine dipstick SMALL (*)    Ketones, ur 5 (*)    All other components within normal limits    Notable for normal UA, no AKI  EKG   EKG Interpretation  Date/Time:  Sunday July 27 2022 18:03:22 EDT Ventricular Rate:  70 PR Interval:  192 QRS Duration: 89 QT Interval:  425 QTC Calculation: 459 R Axis:   -3 Text Interpretation: Sinus rhythm Consider left ventricular hypertrophy  Confirmed by Alvino Blood ([id number]) on 07/27/2022 8:16:45 PM         Imaging Studies ordered: I ordered imaging studies including CT head On my interpretation imaging demonstrates no acute process I independently visualized and interpreted imaging. I agree with the radiologist interpretation   Medicines ordered and prescription drug management: Meds ordered this encounter  Medications   sodium chloride 0.9 % bolus 1,000 mL    -I have reviewed the patients home medicines and have made adjustments as needed  Cardiac Monitoring: The patient was maintained on a cardiac monitor.  I personally viewed and interpreted the cardiac monitored which showed an underlying rhythm of: NSR  Social Determinants of Health:  Diagnosis or treatment significantly limited by social determinants of health: dementia   Reevaluation: After the interventions noted above, I reevaluated the patient and found that their symptoms have resolved  Co morbidities that complicate the patient evaluation  Past Medical History:  Diagnosis Date  Dementia (HCC)    Per pt's daughter   GERD (gastroesophageal reflux disease)    Glaucoma    Hallucination, visual    per pt's daughter   Hypertension       Dispostion: Disposition decision including need for hospitalization was considered, and patient discharged from emergency department.    Final Clinical Impression(s) / ED Diagnoses Final diagnoses:  Heat exposure, initial encounter  Heat syncope, initial encounter     This chart was dictated using voice recognition software.  Despite best efforts to proofread,  errors can occur which can change the documentation meaning.    Lonell Grandchild, MD 07/27/22 2019

## 2022-07-27 NOTE — ED Triage Notes (Signed)
EMS reports from [facility], staff called for syncopal episode after being outside with daughter. Pt brought inside and cooled with ice packs in axillary and groin with response. Hx of dementia.  BP 150/60 HR 60 RR 16 Sp02 98 RA

## 2022-07-27 NOTE — ED Notes (Signed)
[facility] called at this time for transport back to facility. 

## 2022-09-24 ENCOUNTER — Ambulatory Visit (INDEPENDENT_AMBULATORY_CARE_PROVIDER_SITE_OTHER): Payer: 59 | Admitting: Podiatry

## 2022-09-24 DIAGNOSIS — B351 Tinea unguium: Secondary | ICD-10-CM

## 2022-09-24 DIAGNOSIS — M79676 Pain in unspecified toe(s): Secondary | ICD-10-CM

## 2022-09-24 NOTE — Progress Notes (Signed)
  Subjective:  Patient ID: Theresa Bauer, female    DOB: 09/12/1929,   MRN: [id number]  Chief Complaint  Patient presents with   Nail Problem    RFC    89 y.o. female presents for concern of thickened elongated and painful nails that are difficult to trim. Requesting to have them trimmed today.   PCP:  Karna Dupes, MD    . Denies any other pedal complaints. Denies n/v/f/c.   Past Medical History:  Diagnosis Date   Dementia (HCC)    Per pt's daughter   GERD (gastroesophageal reflux disease)    Glaucoma    Hallucination, visual    per pt's daughter   Hypertension     Objective:  Physical Exam: Vascular: DP/PT pulses 2/4 bilateral. CFT <3 seconds. Normal hair growth on digits. No edema.  Skin. No lacerations or abrasions bilateral feet. Nails 1-5 bilateral thickened elongated and dystrophic  Musculoskeletal: MMT 5/5 bilateral lower extremities in DF, PF, Inversion and Eversion. Deceased ROM in DF of ankle joint.  Neurological: Sensation intact to light touch.   Assessment:   1. Pain due to onychomycosis of toenail      Plan:  Patient was evaluated and treated and all questions answered. -Mechanically debrided all nails 1-5 bilateral using sterile nail nipper and filed with dremel without incident  -Answered all patient questions -Patient to return  in 3 months for at risk foot care -Patient advised to call the office if any problems or questions arise in the meantime.   Louann Sjogren, DPM

## 2022-12-03 ENCOUNTER — Emergency Department (HOSPITAL_COMMUNITY): Payer: 59

## 2022-12-03 ENCOUNTER — Encounter (HOSPITAL_COMMUNITY): Payer: Self-pay

## 2022-12-03 ENCOUNTER — Other Ambulatory Visit: Payer: Self-pay

## 2022-12-03 ENCOUNTER — Emergency Department (HOSPITAL_COMMUNITY)
Admission: EM | Admit: 2022-12-03 | Discharge: 2022-12-03 | Disposition: A | Discharge status: Home / Self Care | Payer: 59

## 2022-12-03 DIAGNOSIS — Y92129 Unspecified place in nursing home as the place of occurrence of the external cause: Secondary | ICD-10-CM | POA: Insufficient documentation

## 2022-12-03 DIAGNOSIS — Z79899 Other long term (current) drug therapy: Secondary | ICD-10-CM | POA: Insufficient documentation

## 2022-12-03 DIAGNOSIS — S0990XA Unspecified injury of head, initial encounter: Secondary | ICD-10-CM | POA: Diagnosis not present

## 2022-12-03 DIAGNOSIS — I10 Essential (primary) hypertension: Secondary | ICD-10-CM | POA: Insufficient documentation

## 2022-12-03 DIAGNOSIS — W050XXA Fall from non-moving wheelchair, initial encounter: Secondary | ICD-10-CM | POA: Diagnosis not present

## 2022-12-03 DIAGNOSIS — S8001XA Contusion of right knee, initial encounter: Secondary | ICD-10-CM | POA: Diagnosis not present

## 2022-12-03 DIAGNOSIS — F0390 Unspecified dementia without behavioral disturbance: Secondary | ICD-10-CM | POA: Insufficient documentation

## 2022-12-03 DIAGNOSIS — S8002XA Contusion of left knee, initial encounter: Secondary | ICD-10-CM | POA: Diagnosis not present

## 2022-12-03 DIAGNOSIS — S8000XA Contusion of unspecified knee, initial encounter: Secondary | ICD-10-CM

## 2022-12-03 DIAGNOSIS — S8991XA Unspecified injury of right lower leg, initial encounter: Secondary | ICD-10-CM | POA: Diagnosis present

## 2022-12-03 MED ORDER — ACETAMINOPHEN 325 MG PO TABS
650.0000 mg | ORAL_TABLET | Freq: Once | ORAL | Status: AC
  Administered 2022-12-03: 650 mg via ORAL
  Filled 2022-12-03: qty 2

## 2022-12-03 NOTE — ED Notes (Signed)
Assumed care of patient, pt cleaned and linens changed with assistance from [facility], NAD noted at this time, pt resting in bed, respirations even and unlabored, skin warm and dry, bed in low position, call light within reach. Comfort measures offered, warm blanket given. Will continue to monitor. Waiting for transport and pt verbalized understanding of same.

## 2022-12-03 NOTE — Discharge Instructions (Signed)
Your workup today was reassuring.  Please take Tylenol as needed for pain or soreness.  Follow-up with your doctor.  Return to the ER for worsening symptoms.

## 2022-12-03 NOTE — ED Triage Notes (Signed)
Patient BIB [facility] from [facility] after falling out of her wheelchair and hitting her head. EMS reports contusion to top of her head, no thinners, no LOC. Family insisted she be checked out. Patient reports knee pain, has hx of knee replacement, no tenderness, no deformity noted. A&Ox3, baseline dementia. Able to stand and pivot with assistance. VSS, BP 152/86, HR 70, 98 RA, RR 16.

## 2022-12-03 NOTE — ED Notes (Signed)
Family is getting upset that they've been here 7 hours.

## 2022-12-03 NOTE — ED Notes (Signed)
Family expressed frustration that the results from imaging were not back yet.  Paramedic explained that we were waiting on radiology to read imaging.

## 2022-12-03 NOTE — ED Notes (Signed)
Called [facility]. Facesheet and AVS printed.

## 2022-12-03 NOTE — ED Provider Notes (Signed)
[place] EMERGENCY DEPARTMENT AT [facility] Provider Note   CSN: [id number] Arrival date & time: 12/03/22  1403     History  Chief Complaint  Patient presents with   Theresa Bauer is a 89 y.o. female.  89 year old female with past medical history of hypertension and dementia presenting to the emergency department today with bilateral knee pain after a mechanical fall at her nursing facility.  The patient was apparently trying to empty her trash can when she lost her balance and hit her head on the wall.  She then fell down to her knees.  She did not lose consciousness.  This was not a syncopal episode.  She is complaining of pain in her bilateral knees at this time.  She has been in her normal state of health prior to this.  She denies any other complaints.  Denies any back pain.   Fall       Home Medications Prior to Admission medications   Medication Sig Start Date End Date Taking? Authorizing Provider  acetaminophen (TYLENOL) 500 MG tablet Take 1,000 mg by mouth 2 (two) times daily.   Yes [provider]  amLODipine (NORVASC) 2.5 MG tablet Take 2.5 mg by mouth daily. 11/25/22  Yes [provider]  Calcium Carbonate-Vit D-Min (CALCIUM-VITAMIN D-MINERALS) 600-800 MG-UNIT CHEW Chew 1 tablet by mouth 2 (two) times daily.   Yes [provider]  chlorhexidine (PERIDEX) 0.12 % solution Use as directed 15 mLs in the mouth or throat 2 (two) times daily. 11/24/22  Yes [provider]  clindamycin (CLEOCIN) 300 MG capsule Take 600 mg by mouth once as needed (dental procedure). 11/09/19  Yes [provider]  Dextran 70-Hypromellose (NATURAL BALANCE TEARS) 0.1-0.3 % SOLN Place 1 drop into both eyes 2 (two) times daily as needed (dry eyes).   Yes [provider]  diclofenac Sodium (VOLTAREN) 1 % GEL Apply 2 g topically at bedtime. To both ankles and feet.   Yes [provider]  dorzolamide-timolol (COSOPT)  22.3-6.8 MG/ML ophthalmic solution Place 1 drop into both eyes 2 (two) times daily.  03/11/19  Yes [provider]  Emollient (EUCERIN) lotion Apply 1 Application topically daily. To back and feet, dry skin   Yes [provider]  ferrous sulfate 325 (65 FE) MG tablet Take 325 mg by mouth 3 (three) times a week. Monday, Wednesday, friday   Yes [provider]  furosemide (LASIX) 20 MG tablet Take 10 mg by mouth every other day. 04/11/19  Yes [provider]  gabapentin (NEURONTIN) 100 MG capsule Take 200 mg by mouth at bedtime. 05/11/20  Yes [provider]  latanoprost (XALATAN) 0.005 % ophthalmic solution Place 1 drop into both eyes at bedtime. 05/25/19  Yes [provider]  Lidocaine 4 % PTCH Place 2 patches onto the skin daily. Apply 1 patch topically to the mid back and 1 patch to right hip every morning and remove at night   Yes [provider]  memantine (NAMENDA) 10 MG tablet Take 10 mg by mouth 2 (two) times daily.   Yes [provider]  Menthol, Topical Analgesic, (BIOFREEZE) 4 % GEL Apply 1 application  topically 2 (two) times daily. To the left/right shoulder and left/right hip, thighs and both knees.   Yes [provider]  omeprazole (PRILOSEC) 20 MG capsule Take 20 mg by mouth every other day. 02/22/20  Yes [provider]  propranolol (INDERAL) 10 MG tablet Take  10 mg by mouth 2 (two) times daily.   Yes [provider]  risperiDONE (RISPERDAL) 0.5 MG tablet Take 0.5 mg by mouth 2 (two) times daily. 11/19/22  Yes [provider]  senna-docusate (SENOKOT-S) 8.6-50 MG tablet Take 1 tablet by mouth at bedtime. Patient taking differently: Take 1 tablet by mouth 2 (two) times daily. 11/11/16  Yes Albertine Grates, MD      Allergies    Lisinopril, Darvocet [propoxyphene n-acetaminophen], Aspirin, Brimonidine, Penicillin g, Penicillins, and Darvon [propoxyphene]    Review of Systems   Review of  Systems  Musculoskeletal:        Bilateral knee pain  All other systems reviewed and are negative.   Physical Exam Updated Vital Signs BP (!) 194/82   Pulse 65   Temp 97.7 F (36.5 C) (Oral)   Resp 16   SpO2 99%  Physical Exam Vitals and nursing note reviewed.   Gen: NAD Eyes: PERRL, EOMI HEENT: no oropharyngeal swelling Neck: trachea midline, no midline tenderness Resp: clear to auscultation bilaterally Card: RRR, no murmurs, rubs, or gallops Abd: nontender, nondistended Extremities: The patient is tender over the right and left patella with normal range of motion noted.  There is no obvious deformity although exam is somewhat difficult due to body habitus. Neuro: No focal deficits Vascular: 2+ radial pulses bilaterally, 2+ DP pulses bilaterally Skin: no rashes Psyc: acting appropriately   ED Results / Procedures / Treatments   Labs (all labs ordered are listed, but only abnormal results are displayed) Labs Reviewed - No data to display  EKG None  Radiology No results found.  Procedures Procedures    Medications Ordered in ED Medications  acetaminophen (TYLENOL) tablet 650 mg (650 mg Oral Given 12/03/22 1605)    ED Course/ Medical Decision Making/ A&P                                 Medical Decision Making 89 year old female with past medical history of hypertension and dementia presenting to the emergency department today with bilateral knee pain after a fall.  She did hit her head when she fell.  I will further evaluate her here with a CT scan of her head and neck to evaluate for traumatic injury as well as x-rays of her bilateral knees.  If these are unremarkable I think that the patient may be safely discharged back to her nursing facility.  I will give her Tylenol for pain.  The patient's x-rays and CT scan show chronic findings but no acute injuries.  She is stable for discharge back to her nursing facility.  Her blood pressure is elevated here but she  is denying any symptoms of endorgan dysfunction at this time.  Amount and/or Complexity of Data Reviewed Radiology: ordered.  Risk OTC drugs.           Final Clinical Impression(s) / ED Diagnoses Final diagnoses:  Closed head injury, initial encounter  Contusion of knee, unspecified laterality, initial encounter    Rx / DC Orders ED Discharge Orders     None         Durwin Glaze, MD 12/03/22 1844

## 2022-12-03 NOTE — ED Provider Triage Note (Signed)
Emergency Medicine Provider Triage Evaluation Note  Theresa Bauer , a 89 y.o. female  was evaluated in triage.  Pt complains of fall.  Review of Systems  Positive: Fall from wheelchair, left knee pain Negative: Vomiting, LOC  Physical Exam  There were no vitals taken for this visit. Gen:   Awake, no distress   Resp:  Normal effort  MSK:   Moves extremities without difficulty Left knee tender anteriorly Other:    Medical Decision Making  Medically screening exam initiated at 2:09 PM.  Appropriate orders placed.  JINX CARROZZA was informed that the remainder of the evaluation will be completed by another provider, this initial triage assessment does not replace that evaluation, and the importance of remaining in the ED until their evaluation is complete.  Per EMS, fell from wheelchair. Patient reports she was reaching for something and fell forward. Left knee pain and hit her head. Not anticoagulated. No vomiting, no LOC.   She reports mild chest pain. She will require closer evaluation when undressed in a room.    Elpidio Anis, PA-C 12/03/22 5462

## 2022-12-30 ENCOUNTER — Ambulatory Visit: Payer: 59 | Admitting: Podiatry

## 2023-01-15 ENCOUNTER — Ambulatory Visit: Payer: 59 | Admitting: Podiatry

## 2023-01-16 ENCOUNTER — Ambulatory Visit (INDEPENDENT_AMBULATORY_CARE_PROVIDER_SITE_OTHER): Payer: 59 | Admitting: Podiatry

## 2023-01-16 ENCOUNTER — Encounter: Payer: Self-pay | Admitting: Podiatry

## 2023-01-16 DIAGNOSIS — M79676 Pain in unspecified toe(s): Secondary | ICD-10-CM

## 2023-01-16 DIAGNOSIS — B351 Tinea unguium: Secondary | ICD-10-CM

## 2023-01-16 DIAGNOSIS — I872 Venous insufficiency (chronic) (peripheral): Secondary | ICD-10-CM

## 2023-01-16 NOTE — Progress Notes (Signed)
This patient returns to the office for evaluation and treatment of long thick painful nails .  This patient is unable to trim his own nails since the patient cannot reach his feet.  Patient says the nails are painful walking and wearing her shoes.  she returns for preventive foot care services. She presents to the office with her driver.  General Appearance  Alert, conversant and in no acute stress.  Vascular  Dorsalis pedis and posterior tibial  pulses are  weakly palpable  bilaterally.  Capillary return is within normal limits  bilaterally. Bold feet bilaterally.  Neurologic  Senn-Weinstein monofilament wire test within normal limits  bilaterally. Muscle power within normal limits bilaterally.  Nails Thick disfigured discolored nails with subungual debris  from hallux to fifth toes bilaterally. No evidence of bacterial infection or drainage bilaterally.  Orthopedic  No limitations of motion  feet .  No crepitus or effusions noted.  No bony pathology or digital deformities noted.  Skin  normotropic skin with no porokeratosis noted bilaterally.  No signs of infections or ulcers noted.     Onychomycosis  Pain in toes right foot  Pain in toes left foot  Debridement  of nails  1-5  B/L with a nail nipper.  Nails were then filed using a dremel tool with no incidents.    RTC 3 months    Helane Gunther DPM

## 2023-04-28 ENCOUNTER — Ambulatory Visit (INDEPENDENT_AMBULATORY_CARE_PROVIDER_SITE_OTHER): Payer: 59 | Admitting: Podiatry

## 2023-04-28 ENCOUNTER — Encounter: Payer: Self-pay | Admitting: Podiatry

## 2023-04-28 DIAGNOSIS — B351 Tinea unguium: Secondary | ICD-10-CM

## 2023-04-28 DIAGNOSIS — M79676 Pain in unspecified toe(s): Secondary | ICD-10-CM | POA: Diagnosis not present

## 2023-05-03 NOTE — Progress Notes (Signed)
  Subjective:  Patient ID: Theresa Bauer, female    DOB: Jan 23, 1930,  MRN: [id number]  89 y.o. female presents painful mycotic toenails x 10 which interfere with daily activities. Pain is relieved with periodic professional debridement. Chief Complaint  Patient presents with   Nail Problem    RFC    She is a resident of [facility] facility and is  accompanied by her daughter on today's visit.   PCP is Karna Dupes, MD.  Allergies  Allergen Reactions   Lisinopril Anaphylaxis   Darvocet [Propoxyphene N-Acetaminophen] Itching   Aspirin Hives    itching   Brimonidine Itching   Penicillin G     Other reaction(s): Unknown   Penicillins Hives and Itching    Itching Has patient had a PCN reaction causing immediate rash, facial/tongue/throat swelling, SOB or lightheadedness with hypotension: no Has patient had a PCN reaction causing severe rash involving mucus membranes or skin necrosis: no Has patient had a PCN reaction that required hospitalization: no Has patient had a PCN reaction occurring within the last 10 years: no If all of the above answers are "NO", then may proceed with Cephalosporin use.    Darvon [Propoxyphene] Itching    Review of Systems: Negative except as noted in the HPI.   Objective:  Theresa Bauer is a pleasant 89 y.o. female frail, in NAD. AAO x 3.  Vascular Examination: Capillary refill time to digits immediate b/l. Palpable DP pulse(s) b/l LE. Faintly palpable PT pulse(s) b/l LE. Pedal hair sparse. No pain with calf compression b/l. Trace edema noted BLE. No ischemia or gangrene noted b/l LE. No cyanosis or clubbing noted b/l LE.  Neurological Examination: Sensation grossly intact b/l with 10 gram monofilament. Vibratory sensation intact b/l.  Dermatological Examination: No open wounds b/l LE. No pressure injuries. No interdigital macerations noted b/l LE. Toenails 1-5 b/l thick, discolored, elongated with subungual debris and pain on dorsal  palpation. No hyperkeratotic lesions noted b/l.   Musculoskeletal Examination: Muscle strength 4/5 b/l. Hammertoe deformity noted 2-5 b/l. Utilizes wheelchair for mobility assistance. She does have nonpulsatile palpable soft tissue mass on the lateral aspect of right leg. No erythema, no warmth, no surrounding edema..   Radiographs: None  Last A1c:       No data to display           Assessment:   1. Pain due to onychomycosis of toenail    Plan:  Consent given for treatment. Patient examined. All patient's and/or POA's questions/concerns addressed on today's visit. Mycotic toenails 1-5 debrided in length and girth without incident. Continue soft, supportive shoe gear daily. Report any pedal injuries to medical professional. Call office if there are any quesitons/concerns. -Patient/POA to call should there be question/concern in the interim.  Return in about 3 months (around 07/29/2023).  Freddie Breech, DPM      [place] LOCATION: [address] [place] [address]                   Office [phone]   [facility] LOCATION: [address] [place], [place] [address] Office [phone]

## 2023-05-12 ENCOUNTER — Telehealth: Payer: Self-pay

## 2023-05-12 NOTE — Telephone Encounter (Deleted)
 Patient called and states she was seen and dx with covid. Her medication is too expensive d/t having no job and would like samples or some other medication if possible. Will pass on to Dr. Delton See.

## 2023-08-12 ENCOUNTER — Ambulatory Visit (INDEPENDENT_AMBULATORY_CARE_PROVIDER_SITE_OTHER): Admitting: Podiatry

## 2023-08-12 ENCOUNTER — Encounter: Payer: Self-pay | Admitting: Podiatry

## 2023-08-12 VITALS — Ht 60.0 in | Wt 136.0 lb

## 2023-08-12 DIAGNOSIS — M79676 Pain in unspecified toe(s): Secondary | ICD-10-CM

## 2023-08-12 DIAGNOSIS — R600 Localized edema: Secondary | ICD-10-CM

## 2023-08-12 DIAGNOSIS — B351 Tinea unguium: Secondary | ICD-10-CM

## 2023-08-12 NOTE — Progress Notes (Signed)
 Subjective:  Patient ID: Theresa Bauer, female    DOB: 03/02/1929,  MRN: [id number]  Theresa Bauer presents to clinic today for routine foot care. She is a resident of [address] She is accompanied by her son, Nancyann, on today's visit. Patient has concern of lower extremity swelling. Son states she does sit in wheelchair during the day and her feet are not elevated. Son is interested in getting access to MyChart for his mom   Chief Complaint  Patient presents with   Nail Problem    Patient is here for routine foot care/ trim   New problem(s): None.   PCP is Feliciano Devoria LABOR, MD.  Allergies  Allergen Reactions   Lisinopril Anaphylaxis   Darvocet [Propoxyphene N-Acetaminophen ] Itching   Aspirin Hives    itching   Brimonidine Itching   Penicillin G     Other reaction(s): Unknown   Penicillins Hives and Itching    Itching Has patient had a PCN reaction causing immediate rash, facial/tongue/throat swelling, SOB or lightheadedness with hypotension: no Has patient had a PCN reaction causing severe rash involving mucus membranes or skin necrosis: no Has patient had a PCN reaction that required hospitalization: no Has patient had a PCN reaction occurring within the last 10 years: no If all of the above answers are NO, then may proceed with Cephalosporin use.    Darvon [Propoxyphene] Itching    Review of Systems: Negative except as noted in the HPI.  Objective: No changes noted in today's physical examination. There were no vitals filed for this visit. Theresa Bauer is a pleasant 89 y.o. female in NAD. AAO x 3.  Vascular Examination: Capillary refill time immediate b/l. Vascular status intact b/l with palpable DP pulses; faintly palpable PT pulses. Pedal hair absent b/l. No pain with calf compression b/l. Skin temperature gradient WNL b/l. No cyanosis or clubbing noted b/l. No ischemia or gangrene b/l. Dependent edema noted LLE>RLE.  Neurological Examination: Sensation  grossly intact b/l with 10 gram monofilament.   Dermatological Examination: Pedal skin with normal turgor, texture and tone b/l.  No open wounds. No interdigital macerations.   Toenails 1-5 b/l thick, discolored, elongated with subungual debris and pain on dorsal palpation.   No corns, calluses nor porokeratotic lesions noted.  Musculoskeletal Examination: Muscle strength 4/5 to all lower extremity muscle groups bilaterally. Hammertoe(s) 2-5 b/l. Utilizes wheelchair for mobility assistance.  Radiographs: None  Assessment/Plan: 1. Pain due to onychomycosis of toenail   2. Lower extremity edema   -Informed son instructions for obtaining access to MyChart is printed on Mom's after visit summary. He related understanding. -Patient was evaluated and treated. All patient's and/or POA's questions/concerns addressed on today's visit. Toenails 1-5 debrided in length and girth without incident. Continue soft, supportive shoe gear daily. Report any pedal injuries to medical professional. Call office if there are any questions/concerns. -Patient's family member present. All questions/concerns addressed on today's visit. -Facility to continue fall precautions and pressure precautions. -Order written for facility to elevate feet to minimize lower extremity edema. -Patient/POA to call should there be question/concern in the interim.   Return in about 3 months (around 11/12/2023).  Delon LITTIE Merlin, DPM      [place] LOCATION: [address]  [place], [place] [address]                   Office [phone]   [facility] LOCATION: [address] [place], [place] [address] Office [phone]

## 2023-10-18 ENCOUNTER — Encounter (HOSPITAL_COMMUNITY): Payer: Self-pay | Admitting: Radiology

## 2023-10-18 ENCOUNTER — Emergency Department (HOSPITAL_COMMUNITY)
Admission: EM | Admit: 2023-10-18 | Discharge: 2023-10-19 | Disposition: A | Discharge status: Home / Self Care | Attending: Emergency Medicine | Admitting: Emergency Medicine

## 2023-10-18 DIAGNOSIS — R400 Somnolence: Secondary | ICD-10-CM | POA: Insufficient documentation

## 2023-10-18 DIAGNOSIS — F0390 Unspecified dementia without behavioral disturbance: Secondary | ICD-10-CM | POA: Diagnosis not present

## 2023-10-18 DIAGNOSIS — R5383 Other fatigue: Secondary | ICD-10-CM | POA: Diagnosis present

## 2023-10-18 DIAGNOSIS — I10 Essential (primary) hypertension: Secondary | ICD-10-CM | POA: Insufficient documentation

## 2023-10-18 DIAGNOSIS — Z79899 Other long term (current) drug therapy: Secondary | ICD-10-CM | POA: Insufficient documentation

## 2023-10-18 LAB — CBC WITH DIFFERENTIAL/PLATELET
Abs Immature Granulocytes: 0.01 K/uL (ref 0.00–0.07)
Basophils Absolute: 0 K/uL (ref 0.0–0.1)
Basophils Relative: 0 %
Eosinophils Absolute: 0.1 K/uL (ref 0.0–0.5)
Eosinophils Relative: 1 %
HCT: 36.2 % (ref 36.0–46.0)
Hemoglobin: 11.3 g/dL — ABNORMAL LOW (ref 12.0–15.0)
Immature Granulocytes: 0 %
Lymphocytes Relative: 46 %
Lymphs Abs: 2.1 K/uL (ref 0.7–4.0)
MCH: 23.4 pg — ABNORMAL LOW (ref 26.0–34.0)
MCHC: 31.2 g/dL (ref 30.0–36.0)
MCV: 74.9 fL — ABNORMAL LOW (ref 80.0–100.0)
Monocytes Absolute: 0.7 K/uL (ref 0.1–1.0)
Monocytes Relative: 16 %
Neutro Abs: 1.7 K/uL (ref 1.7–7.7)
Neutrophils Relative %: 37 %
Platelets: 221 K/uL (ref 150–400)
RBC: 4.83 MIL/uL (ref 3.87–5.11)
RDW: 15.8 % — ABNORMAL HIGH (ref 11.5–15.5)
WBC: 4.6 K/uL (ref 4.0–10.5)
nRBC: 0 % (ref 0.0–0.2)

## 2023-10-18 LAB — COMPREHENSIVE METABOLIC PANEL WITH GFR
ALT: 19 U/L (ref 0–44)
AST: 22 U/L (ref 15–41)
Albumin: 3.1 g/dL — ABNORMAL LOW (ref 3.5–5.0)
Alkaline Phosphatase: 84 U/L (ref 38–126)
Anion gap: 12 (ref 5–15)
BUN: 14 mg/dL (ref 8–23)
CO2: 25 mmol/L (ref 22–32)
Calcium: 9 mg/dL (ref 8.9–10.3)
Chloride: 100 mmol/L (ref 98–111)
Creatinine, Ser: 0.81 mg/dL (ref 0.44–1.00)
GFR, Estimated: >60 mL/min (ref >60)
Glucose, Bld: 105 mg/dL — ABNORMAL HIGH (ref 70–99)
Potassium: 3.3 mmol/L — ABNORMAL LOW (ref 3.5–5.1)
Sodium: 137 mmol/L (ref 135–145)
Total Bilirubin: 0.3 mg/dL (ref 0.0–1.2)
Total Protein: 7.4 g/dL (ref 6.5–8.1)

## 2023-10-18 LAB — URINALYSIS, ROUTINE W REFLEX MICROSCOPIC
Bilirubin Urine: NEGATIVE
Glucose, UA: NEGATIVE mg/dL
Hgb urine dipstick: NEGATIVE
Ketones, ur: NEGATIVE mg/dL
Leukocytes,Ua: NEGATIVE
Nitrite: NEGATIVE
Protein, ur: NEGATIVE mg/dL
Specific Gravity, Urine: 1.013 (ref 1.005–1.030)
pH: 6 (ref 5.0–8.0)

## 2023-10-18 LAB — MAGNESIUM: Magnesium: 2.1 mg/dL (ref 1.7–2.4)

## 2023-10-18 MED ORDER — AMLODIPINE BESYLATE 5 MG PO TABS: 2.5000 mg | ORAL_TABLET | Freq: Every day | ORAL | Status: DC

## 2023-10-18 MED ORDER — OYSTER SHELL CALCIUM/D3 500-5 MG-MCG PO TABS
1.0000 | ORAL_TABLET | Freq: Two times a day (BID) | ORAL | Status: DC
  Filled 2023-10-18: qty 1

## 2023-10-18 MED ORDER — LATANOPROST 0.005 % OP SOLN
1.0000 [drp] | Freq: Every day | OPHTHALMIC | Status: DC
  Filled 2023-10-18: qty 2.5

## 2023-10-18 MED ORDER — LABETALOL HCL 5 MG/ML IV SOLN
10.0000 mg | Freq: Once | INTRAVENOUS | Status: AC
  Administered 2023-10-18: 10 mg via INTRAVENOUS
  Filled 2023-10-18: qty 4

## 2023-10-18 MED ORDER — DORZOLAMIDE HCL-TIMOLOL MAL 2-0.5 % OP SOLN
1.0000 [drp] | Freq: Two times a day (BID) | OPHTHALMIC | Status: DC
  Filled 2023-10-18: qty 10

## 2023-10-18 MED ORDER — POTASSIUM CHLORIDE 20 MEQ PO PACK
40.0000 meq | PACK | Freq: Two times a day (BID) | ORAL | Status: DC
  Administered 2023-10-18: 40 meq via ORAL
  Filled 2023-10-18: qty 2

## 2023-10-18 MED ORDER — FUROSEMIDE 20 MG PO TABS: 10.0000 mg | ORAL_TABLET | ORAL | Status: DC

## 2023-10-18 MED ORDER — PANTOPRAZOLE SODIUM 40 MG PO TBEC: 40.0000 mg | DELAYED_RELEASE_TABLET | Freq: Every day | ORAL | Status: DC

## 2023-10-18 MED ORDER — PROPRANOLOL HCL 10 MG PO TABS
10.0000 mg | ORAL_TABLET | Freq: Two times a day (BID) | ORAL | Status: DC
  Administered 2023-10-19: 10 mg via ORAL
  Filled 2023-10-18: qty 1

## 2023-10-18 MED ORDER — GABAPENTIN 100 MG PO CAPS
200.0000 mg | ORAL_CAPSULE | Freq: Every day | ORAL | Status: DC
  Administered 2023-10-19: 200 mg via ORAL
  Filled 2023-10-18: qty 2

## 2023-10-18 MED ORDER — RISPERIDONE 2 MG PO TABS
2.0000 mg | ORAL_TABLET | Freq: Every day | ORAL | Status: DC
  Administered 2023-10-19: 2 mg via ORAL
  Filled 2023-10-18: qty 1

## 2023-10-18 MED ORDER — ACETAMINOPHEN 500 MG PO TABS
1000.0000 mg | ORAL_TABLET | Freq: Two times a day (BID) | ORAL | Status: DC
  Administered 2023-10-19: 1000 mg via ORAL
  Filled 2023-10-18: qty 2

## 2023-10-18 MED ORDER — MEMANTINE HCL 10 MG PO TABS
10.0000 mg | ORAL_TABLET | Freq: Two times a day (BID) | ORAL | Status: DC
  Administered 2023-10-19: 10 mg via ORAL
  Filled 2023-10-18: qty 1

## 2023-10-18 MED ORDER — SENNOSIDES-DOCUSATE SODIUM 8.6-50 MG PO TABS
1.0000 | ORAL_TABLET | Freq: Two times a day (BID) | ORAL | Status: DC
Start: 2023-10-19 — End: 2023-10-19
  Administered 2023-10-19: 1 via ORAL
  Filled 2023-10-18: qty 1

## 2023-10-18 MED ORDER — AMLODIPINE BESYLATE 5 MG PO TABS
2.5000 mg | ORAL_TABLET | Freq: Once | ORAL | Status: AC
  Administered 2023-10-18: 2.5 mg via ORAL
  Filled 2023-10-18: qty 1

## 2023-10-18 NOTE — Discharge Instructions (Signed)
 Princella LILLETTE Novak  Thank you for allowing us  to take care of you today.  You came to the Emergency Department today because you were sleepier at dinner today.  Your daughter believes that you may have received 25 mg of your Seroquel rather than your 12.5 mg dose.  We asked that your facility please ensure that you receive cycle as a 12.5 mg dose rather than 25 mg dose.  After monitoring in the emergency department you are feeling back to usual self, we got screening labs and a screening urine and EKG which were reassuring.  Given you are feeling better, your labs and exam are reassuring, we are discharging back to facility.SABRA  To-Do: 1. Please follow-up with your primary doctor within 2 days / as soon as possible.   Please return to the Emergency Department or call 911 if you experience have worsening of your symptoms, or do not get better, chest pain, shortness of breath, severe or significantly worsening pain, high fever, severe confusion, pass out or have any reason to think that you need emergency medical care.   We hope you feel better soon.   Mitzie Later, MD Department of Emergency Medicine [facility]

## 2023-10-18 NOTE — ED Triage Notes (Signed)
 Family picked her up from [facility] around 4pm. They noticed she was more lethargic than normal. 12.5 mg Seroquel is normal dose and she was given full 25mg  dose. She was unable to stay awake during dinner and family called EMS. Baseline dementia.   136/74 80 99% RA CBG 156  18G L AC

## 2023-10-18 NOTE — ED Notes (Signed)
EDP notified on patient's hypertension .  

## 2023-10-18 NOTE — ED Provider Notes (Signed)
 [facility] Provider Note   CSN: [id number] Arrival date & time: 10/18/23  1930     History Chief Complaint  Patient presents with   Fatigue    HPI: Theresa Bauer is a 89 y.o. female with history pertinent for HTN, venous insufficiency, GERD, osteoporosis, who presents complaining of increased somnolence. Patient arrived via EMS from home.  History provided by patient and EMS.  No interpreter required during this encounter.  Patient lives at [facility].  Was picked up by her family to take her in for dinner.  Family reports that prior to leaving [facility] the facility administered her Seroquel, reports that that dose is 12.5 mg, however family reports that they believe that the tablet that she was given was a 25 mg tablet.  Reports that she was initially acting as usual self, however during dinner she became increasingly somnolent and could not stay awake despite in conversation, therefore they called EMS.  EMS reports that patient had normal blood sugar en route, did not require any interventions, was initially somnolent, however is arousable and has had improving mental status en route.  Patient denies fever, chest pain, shortness of breath, abdominal pain, denies any complaints.  Patient's recorded medical, surgical, social, medication list and allergies were reviewed in the Snapshot window as part of the initial history.   Prior to Admission medications   Medication Sig Start Date End Date Taking? Authorizing Provider  acetaminophen  (TYLENOL ) 500 MG tablet Take 1,000 mg by mouth 2 (two) times daily.    [provider]  amLODipine  (NORVASC ) 2.5 MG tablet Take 2.5 mg by mouth daily. 11/25/22   [provider]  Calcium  Carbonate-Vit D-Min (CALCIUM -VITAMIN D-MINERALS) 600-800 MG-UNIT CHEW Chew 1 tablet by mouth 2 (two) times daily.    [provider]  chlorhexidine (PERIDEX) 0.12 % solution Use as directed 15 mLs  in the mouth or throat 2 (two) times daily. 11/24/22   [provider]  clindamycin (CLEOCIN) 300 MG capsule Take 600 mg by mouth once as needed (dental procedure). 11/09/19   [provider]  Dextran 70-Hypromellose (NATURAL BALANCE TEARS) 0.1-0.3 % SOLN Place 1 drop into both eyes 2 (two) times daily as needed (dry eyes).    [provider]  diclofenac Sodium (VOLTAREN) 1 % GEL Apply 2 g topically at bedtime. To both ankles and feet.    [provider]  dorzolamide -timolol  (COSOPT ) 22.3-6.8 MG/ML ophthalmic solution Place 1 drop into both eyes 2 (two) times daily.  03/11/19   [provider]  Emollient (EUCERIN) lotion Apply 1 Application topically daily. To back and feet, dry skin    [provider]  ferrous sulfate 325 (65 FE) MG tablet Take 325 mg by mouth 3 (three) times a week. Monday, Wednesday, friday    [provider]  furosemide  (LASIX ) 20 MG tablet Take 10 mg by mouth every other day. 04/11/19   [provider]  gabapentin  (NEURONTIN ) 100 MG capsule Take 200 mg by mouth at bedtime. 05/11/20   [provider]  latanoprost  (XALATAN ) 0.005 % ophthalmic solution Place 1 drop into both eyes at bedtime. 05/25/19   [provider]  Lidocaine  4 % PTCH Place 2 patches onto the skin daily. Apply 1 patch topically to the mid back and 1 patch to right hip every morning and remove at night    [provider]  memantine  (NAMENDA ) 10 MG tablet Take 10 mg by mouth 2 (  two) times daily.    [provider]  Menthol, Topical Analgesic, (BIOFREEZE) 4 % GEL Apply 1 application  topically 2 (two) times daily. To the left/right shoulder and left/right hip, thighs and both knees.    [provider]  omeprazole (PRILOSEC) 20 MG capsule Take 20 mg by mouth every other day. 02/22/20   [provider]  propranolol  (INDERAL ) 10 MG tablet Take 10 mg by mouth 2 (two) times daily.    [provider]  risperiDONE  (RISPERDAL ) 0.5 MG tablet Take 0.5 mg by mouth 2 (two) times daily. 11/19/22   [provider]  senna-docusate (SENOKOT-S) 8.6-50 MG tablet Take 1 tablet by mouth at bedtime. Patient taking differently: Take 1 tablet by mouth 2 (two) times daily. 11/11/16   Jerri Keys, MD     Allergies: Lisinopril, Darvocet [propoxyphene n-acetaminophen ], Aspirin, Brimonidine, Penicillin g, Penicillins, and Darvon [propoxyphene]   Review of Systems   ROS as per HPI  Physical Exam Updated Vital Signs BP (!) 212/94   Pulse 97   Temp (!) 97.3 F (36.3 C) (Oral)   Resp (!) 21   Ht 5' (1.524 m)   Wt 61.7 kg   SpO2 100%   BMI 26.56 kg/m  Physical Exam Vitals and nursing note reviewed.  Constitutional:      General: She is not in acute distress.    Appearance: She is well-developed.  HENT:     Head: Normocephalic and atraumatic.  Eyes:     Conjunctiva/sclera: Conjunctivae normal.  Cardiovascular:     Rate and Rhythm: Normal rate and regular rhythm.     Heart sounds: No murmur heard. Pulmonary:     Effort: Pulmonary effort is normal. No respiratory distress.     Breath sounds: Normal breath sounds.  Abdominal:     Palpations: Abdomen is soft.     Tenderness: There is no abdominal tenderness.  Musculoskeletal:        General: No swelling.     Cervical back: Neck supple.  Skin:    General: Skin is warm and dry.     Capillary Refill: Capillary refill takes less than 2 seconds.  Neurological:     Mental Status: She is alert.     GCS: GCS eye subscore is 3. GCS verbal subscore is 5. GCS motor subscore is 6.  Psychiatric:        Mood and Affect: Mood normal.     ED Course/ Medical Decision Making/ A&P    Procedures Procedures   Medications Ordered in ED Medications  potassium chloride  (KLOR-CON ) packet 40 mEq (40 mEq Oral Given 10/18/23 2129)  labetalol  (NORMODYNE ) injection 10 mg (10 mg Intravenous Given 10/18/23 2247)  amLODipine  (NORVASC ) tablet 2.5 mg (2.5 mg  Oral Given 10/18/23 2248)    Medical Decision Making:   Theresa Bauer is a 89 y.o. female who presents for altered status as per above.  Physical exam is pertinent for GCS 14 (3, 5, 6).   The differential includes but is not limited to mild Seroquel overdose, arrhythmia, hypoglycemia, electrolyte derangement, metabolic encephalopathy, infection.  Independent historian: Relative: Daughter and EMS  External data reviewed: Labs  Initial Plan:   Screening labs including CBC and Metabolic panel to evaluate for infectious or metabolic etiology of disease.  Urinalysis with reflex culture ordered to evaluate for UTI or relevant urologic/nephrologic pathology.  EKG to evaluate for cardiac pathology Magnesium  given patient has slightly prolonged QT which is not baseline  Labs: Ordered, Independent interpretation, and  Details: CBC without leukocytosis, stable anemia, no thrombocytopenia.  Radiology: Not indicated No results found.  EKG/Medicine tests: Ordered and Independent interpretation EKG Interpretation: Sinus rhythm Prolonged PR interval Left atrial enlargement LVH with secondary repolarization abnormality Borderline prolonged QT interval QT prolongation is new from prior Confirmed by Rogelia Satterfield ([id number]) on 10/18/2023 7:44:58 PM  Interventions: Potassium chloride , labetalol , amlodipine   See the EMR for full details regarding lab and imaging results.  Patient presents for somnolence, is GCS 14 on exam, easily arousable.  Given Seroquel is a sedating medication and family reports that patient was given double the dose of her usual Seroquel, this could certainly contribute to increased somnolence.  Patient does not have any other localizing deficits on exam, is hemodynamically stable.  Will obtain labs, EKG to evaluate for other underlying etiologies and monitor patient in the ED.  EKG, screening labs reassuring.  Patient has mild hypokalemia which was repleted with oral potassium.   Daughter presented to bedside and feels that patient is approaching baseline.  Patient did develop asymptomatic hypertension while in the ED, has a history of hypertension.  Improved after 10 IV labetalol  and home oral amlodipine .  Patient remained asymptomatic.  On reevaluation, blood pressure improved to 160s/70s.  Given reassuring labs, exam, patient back to baseline per daughter at bedside, will discharge back to facility.  Patient will be discharged pending transportation back to facility, therefore home medications, diet ordered.  Presentation is most consistent with acute complicated illness, Presentation is most consistent with exacerbation of chronic illness, and Current presentation is complicated by underlying chronic conditions  Discussion of management or test interpretations with external provider(s): Not indicated  Risk Drugs:Prescription drug management  Disposition: DISCHARGE: I believe that the patient is safe for discharge home with outpatient follow-up. Patient was informed of all pertinent physical exam, laboratory, and imaging findings. Patient's suspected etiology of their symptom presentation was discussed with the patient and all questions were answered. We discussed following up with PCP. I provided thorough ED return precautions. The patient feels safe and comfortable with this plan.  MDM generated using voice dictation software and may contain dictation errors.  Please contact me for any clarification or with any questions.  Clinical Impression:  1. Somnolence   2. Hypertension, unspecified type      Discharge   Final Clinical Impression(s) / ED Diagnoses Final diagnoses:  Somnolence  Hypertension, unspecified type    Rx / DC Orders ED Discharge Orders     None        Rogelia Satterfield RAMAN, MD 10/18/23 2358

## 2023-10-19 DIAGNOSIS — I10 Essential (primary) hypertension: Secondary | ICD-10-CM | POA: Diagnosis not present

## 2023-10-19 NOTE — ED Notes (Signed)
 Pharmacy notified to send patient's eye drops .

## 2023-10-19 NOTE — ED Notes (Signed)
 Called [facility] again to see about ride status. Stated patient was never on the list. Santina ahead and set up ride for patient. Stated they will get somebody out in a few minutes  Patient also changed and cleansed with clean and dry sheets at this time  Per notes night shift RN called report to [facility] overnight

## 2023-10-19 NOTE — ED Notes (Signed)
 Report given to Nia RN at [facility] , [facility] will transport patient .

## 2023-10-19 NOTE — ED Notes (Signed)
 Attempting calling [facility] for ride ETA, phone lines not open. [employer] non emerg line has no information they can give me.

## 2023-12-02 ENCOUNTER — Encounter: Payer: Self-pay | Admitting: Podiatry

## 2023-12-02 ENCOUNTER — Ambulatory Visit: Admitting: Podiatry

## 2023-12-02 DIAGNOSIS — M79676 Pain in unspecified toe(s): Secondary | ICD-10-CM

## 2023-12-02 DIAGNOSIS — B351 Tinea unguium: Secondary | ICD-10-CM | POA: Diagnosis not present

## 2023-12-06 NOTE — Progress Notes (Signed)
 Subjective:  Patient ID: Theresa Bauer, female    DOB: 05-18-1929,  MRN: [id number]  Theresa Bauer presents to clinic today for painful mycotic toenails x 10 which interfere with daily activities. Pain is relieved with periodic professional debridement. Patient is a resident of [address]. She is accompanied by staff member, Theresa Bauer, on today's visit. Patient is concerned about edema LLE. Chief Complaint  Patient presents with   RFC     RFC Non diabetic toenail trim.LOV with PCP    New problem(s): None.   PCP is Feliciano Devoria LABOR, MD.  Allergies  Allergen Reactions   Lisinopril Anaphylaxis   Darvocet [Propoxyphene N-Acetaminophen ] Itching   Aspirin Hives    itching   Brimonidine Itching   Penicillin G     Other reaction(s): Unknown   Penicillins Hives and Itching    Itching Has patient had a PCN reaction causing immediate rash, facial/tongue/throat swelling, SOB or lightheadedness with hypotension: no Has patient had a PCN reaction causing severe rash involving mucus membranes or skin necrosis: no Has patient had a PCN reaction that required hospitalization: no Has patient had a PCN reaction occurring within the last 10 years: no If all of the above answers are NO, then may proceed with Cephalosporin use.    Darvon [Propoxyphene] Itching    Review of Systems: Negative except as noted in the HPI.  Objective: No changes noted in today's physical examination. There were no vitals filed for this visit. Theresa Bauer is a pleasant 89 y.o. female in NAD. AAO x 3.  Vascular Examination: Capillary refill time immediate b/l. Vascular status intact b/l with palpable DP pulses; faintly palpable PT pulses. Pedal hair absent b/l. No pain with calf compression b/l. Skin temperature gradient WNL b/l. No cyanosis or clubbing noted b/l. No ischemia or gangrene b/l. Dependent edema noted LLE>RLE.  Neurological Examination: Sensation grossly intact b/l with 10 gram monofilament.    Dermatological Examination: Pedal skin with normal turgor, texture and tone b/l.  No open wounds. No interdigital macerations. No pressure injuries.  Toenails 1-5 b/l thick, discolored, elongated with subungual debris and pain on dorsal palpation.   No corns, calluses nor porokeratotic lesions noted.  Musculoskeletal Examination: Muscle strength 4/5 to all lower extremity muscle groups bilaterally. Hammertoe(s) 2-5 b/l. Utilizes wheelchair for mobility assistance.  Radiographs: None  Assessment/Plan: 1. Pain due to onychomycosis of toenail   -Facility staff present with patient.  All questions/concerns addressed on today's visit. -Discussed causes of pedal edema. She has had no changes from prior visit. Discussed elevating feet when at rest. . -Facility to continue fall precautions and pressure precautions. -Patient to continue soft, supportive shoe gear daily. -Mycotic toenails 1-5 bilaterally were debrided in length and girth with sterile nail nippers and dremel without incident. -Patient/POA to call should there be question/concern in the interim.   Return in about 3 months (around 03/03/2024).  Theresa Bauer, DPM      [place] LOCATION: [address], [place] [address]                   Office [phone]   [facility] LOCATION: [address] [place], [place] [address] Office [phone]

## 2024-01-13 ENCOUNTER — Other Ambulatory Visit: Payer: Self-pay

## 2024-01-13 ENCOUNTER — Emergency Department (HOSPITAL_COMMUNITY)

## 2024-01-13 ENCOUNTER — Encounter (HOSPITAL_COMMUNITY): Payer: Self-pay | Admitting: Family Medicine

## 2024-01-13 ENCOUNTER — Inpatient Hospital Stay (HOSPITAL_COMMUNITY)
Admission: EM | Admit: 2024-01-13 | Discharge: 2024-01-18 | DRG: 092 | Disposition: A | Discharge status: Skilled Nursing Facility | Source: Skilled Nursing Facility | Attending: Internal Medicine | Admitting: Internal Medicine

## 2024-01-13 DIAGNOSIS — I10 Essential (primary) hypertension: Secondary | ICD-10-CM | POA: Diagnosis present

## 2024-01-13 DIAGNOSIS — F0390 Unspecified dementia without behavioral disturbance: Secondary | ICD-10-CM | POA: Diagnosis not present

## 2024-01-13 DIAGNOSIS — G9340 Encephalopathy, unspecified: Secondary | ICD-10-CM | POA: Diagnosis not present

## 2024-01-13 LAB — URINALYSIS, W/ REFLEX TO CULTURE (INFECTION SUSPECTED)
Bacteria, UA: NONE SEEN
Bilirubin Urine: NEGATIVE
Glucose, UA: 100 mg/dL — AB
Hgb urine dipstick: NEGATIVE
Ketones, ur: NEGATIVE mg/dL
Leukocytes,Ua: NEGATIVE
Nitrite: NEGATIVE
Protein, ur: NEGATIVE mg/dL
Specific Gravity, Urine: 1.015 (ref 1.005–1.030)
pH: 7 (ref 5.0–8.0)

## 2024-01-13 LAB — BASIC METABOLIC PANEL WITH GFR
Anion gap: 12 (ref 5–15)
BUN: 15 mg/dL (ref 8–23)
CO2: 28 mmol/L (ref 22–32)
Calcium: 9.3 mg/dL (ref 8.9–10.3)
Chloride: 101 mmol/L (ref 98–111)
Creatinine, Ser: 0.57 mg/dL (ref 0.44–1.00)
GFR, Estimated: >60 mL/min (ref >60)
Glucose, Bld: 109 mg/dL — ABNORMAL HIGH (ref 70–99)
Potassium: 4.2 mmol/L (ref 3.5–5.1)
Sodium: 141 mmol/L (ref 135–145)

## 2024-01-13 LAB — CBC WITH DIFFERENTIAL/PLATELET
Abs Immature Granulocytes: 0.02 K/uL (ref 0.00–0.07)
Basophils Absolute: 0 K/uL (ref 0.0–0.1)
Basophils Relative: 0 %
Eosinophils Absolute: 0 K/uL (ref 0.0–0.5)
Eosinophils Relative: 1 %
HCT: 38.3 % (ref 36.0–46.0)
Hemoglobin: 12.3 g/dL (ref 12.0–15.0)
Immature Granulocytes: 0 %
Lymphocytes Relative: 35 %
Lymphs Abs: 1.9 K/uL (ref 0.7–4.0)
MCH: 23.9 pg — ABNORMAL LOW (ref 26.0–34.0)
MCHC: 32.1 g/dL (ref 30.0–36.0)
MCV: 74.4 fL — ABNORMAL LOW (ref 80.0–100.0)
Monocytes Absolute: 0.7 K/uL (ref 0.1–1.0)
Monocytes Relative: 13 %
Neutro Abs: 2.8 K/uL (ref 1.7–7.7)
Neutrophils Relative %: 51 %
Platelets: 151 K/uL (ref 150–400)
RBC: 5.15 MIL/uL — ABNORMAL HIGH (ref 3.87–5.11)
RDW: 17.7 % — ABNORMAL HIGH (ref 11.5–15.5)
WBC: 5.6 K/uL (ref 4.0–10.5)
nRBC: 0.4 % — ABNORMAL HIGH (ref 0.0–0.2)

## 2024-01-13 LAB — I-STAT CHEM 8, ED
BUN: 23 mg/dL (ref 8–23)
Calcium, Ion: 1.1 mmol/L — ABNORMAL LOW (ref 1.15–1.40)
Chloride: 105 mmol/L (ref 98–111)
Creatinine, Ser: 0.8 mg/dL (ref 0.44–1.00)
Glucose, Bld: 105 mg/dL — ABNORMAL HIGH (ref 70–99)
HCT: 41 % (ref 36.0–46.0)
Hemoglobin: 13.9 g/dL (ref 12.0–15.0)
Potassium: 5.6 mmol/L — ABNORMAL HIGH (ref 3.5–5.1)
Sodium: 141 mmol/L (ref 135–145)
TCO2: 29 mmol/L (ref 22–32)

## 2024-01-13 LAB — HEPATIC FUNCTION PANEL
ALT: 22 U/L (ref 0–44)
AST: 25 U/L (ref 15–41)
Albumin: 3.2 g/dL — ABNORMAL LOW (ref 3.5–5.0)
Alkaline Phosphatase: 98 U/L (ref 38–126)
Bilirubin, Direct: 0.2 mg/dL (ref 0.0–0.2)
Indirect Bilirubin: 1 mg/dL — ABNORMAL HIGH (ref 0.3–0.9)
Total Bilirubin: 1.2 mg/dL (ref 0.0–1.2)
Total Protein: 7.7 g/dL (ref 6.5–8.1)

## 2024-01-13 LAB — TROPONIN I (HIGH SENSITIVITY)
Troponin I (High Sensitivity): 8 ng/L (ref <18)
Troponin I (High Sensitivity): 8 ng/L (ref <18)

## 2024-01-13 LAB — RESP PANEL BY RT-PCR (RSV, FLU A&B, COVID)  RVPGX2
Influenza A by PCR: NEGATIVE
Influenza B by PCR: NEGATIVE
Resp Syncytial Virus by PCR: NEGATIVE
SARS Coronavirus 2 by RT PCR: NEGATIVE

## 2024-01-13 LAB — I-STAT CG4 LACTIC ACID, ED: Lactic Acid, Venous: 0.8 mmol/L (ref 0.5–1.9)

## 2024-01-13 LAB — LIPASE, BLOOD: Lipase: 22 U/L (ref 11–51)

## 2024-01-13 MED ORDER — LACTATED RINGERS IV BOLUS
500.0000 mL | Freq: Once | INTRAVENOUS | Status: AC
  Administered 2024-01-13: 500 mL via INTRAVENOUS

## 2024-01-13 MED ORDER — ACETAMINOPHEN 325 MG PO TABS
650.0000 mg | ORAL_TABLET | Freq: Four times a day (QID) | ORAL | Status: DC | PRN
  Administered 2024-01-17: 650 mg via ORAL
  Filled 2024-01-13: qty 2

## 2024-01-13 MED ORDER — ONDANSETRON HCL 4 MG/2ML IJ SOLN: 4.0000 mg | Freq: Four times a day (QID) | INTRAMUSCULAR | Status: DC | PRN

## 2024-01-13 MED ORDER — ENOXAPARIN SODIUM 40 MG/0.4ML IJ SOSY
40.0000 mg | PREFILLED_SYRINGE | INTRAMUSCULAR | Status: DC
  Administered 2024-01-14 – 2024-01-18 (×4): 40 mg via SUBCUTANEOUS
  Filled 2024-01-13 (×5): qty 0.4

## 2024-01-13 MED ORDER — SODIUM CHLORIDE 0.9% FLUSH
3.0000 mL | Freq: Two times a day (BID) | INTRAVENOUS | Status: DC
  Administered 2024-01-14 – 2024-01-18 (×6): 3 mL via INTRAVENOUS

## 2024-01-13 MED ORDER — SODIUM CHLORIDE 0.45 % IV SOLN: INTRAVENOUS | Status: AC

## 2024-01-13 MED ORDER — ACETAMINOPHEN 650 MG RE SUPP: 650.0000 mg | Freq: Four times a day (QID) | RECTAL | Status: DC | PRN

## 2024-01-13 MED ORDER — ONDANSETRON HCL 4 MG PO TABS: 4.0000 mg | ORAL_TABLET | Freq: Four times a day (QID) | ORAL | Status: DC | PRN

## 2024-01-13 NOTE — H&P (Signed)
 History and Physical    MELANNIE METZNER FMW:[id number] DOB: Jul 20, 1929 DOA: 01/13/2024  PCP: Feliciano Devoria LABOR, MD   Patient coming from: SNF   Chief Complaint: Somnolence   HPI: TINIE MCGLOIN is a 89 y.o. female with medical history significant for hypertension, dementia, and GERD who presents for evaluation of somnolence.  Patient is accompanied by her daughter who helps with the history.  At her baseline, the patient ambulates with a walker and is conversant.  For the past week, she has been increasingly somnolent and is no longer eating, drinking, or getting out of bed.  Today, she will open her eyes and occasionally mumble a one-word answer.  She had not been complaining of anything recently.  Her daughter suspects that the somnolence is due to medications.  ED Course: Upon arrival to the ED, patient is found to be afebrile and saturating well on room air with normal HR and elevated BP.  Labs are notable for normal creatinine, normal electrolytes, normal WBC and hemoglobin, normal lactate, normal troponin x 2, and no bacteriuria or pyuria on UA.  There are no acute findings on chest x-ray or head CT.  Patient was given 500 mL of LR in the ED.  Review of Systems:  All other systems reviewed and apart from HPI, are negative.  Past Medical History:  Diagnosis Date   Dementia (HCC)    Per pt's daughter   GERD (gastroesophageal reflux disease)    Glaucoma    Hallucination, visual    per pt's daughter   Hypertension     Past Surgical History:  Procedure Laterality Date   CATARACT EXTRACTION Left    GLAUCOMA REPAIR Left    REPLACEMENT TOTAL KNEE BILATERAL  2007/2008    Social History:   reports that she has never smoked. She has never used smokeless tobacco. She reports that she does not drink alcohol and does not use drugs.  Allergies  Allergen Reactions   Lisinopril Anaphylaxis   Darvocet [Propoxyphene N-Acetaminophen ] Itching   Aspirin Hives    itching   Brimonidine  Itching   Penicillin G     Other reaction(s): Unknown   Penicillins Hives and Itching    Itching Has patient had a PCN reaction causing immediate rash, facial/tongue/throat swelling, SOB or lightheadedness with hypotension: no Has patient had a PCN reaction causing severe rash involving mucus membranes or skin necrosis: no Has patient had a PCN reaction that required hospitalization: no Has patient had a PCN reaction occurring within the last 10 years: no If all of the above answers are NO, then may proceed with Cephalosporin use.    Darvon [Propoxyphene] Itching    Family History  Problem Relation Age of Onset   Heart attack Mother    Heart attack Father    Osteoarthritis Father      Prior to Admission medications   Medication Sig Start Date End Date Taking? Authorizing Provider  acetaminophen  (TYLENOL ) 500 MG tablet Take 1,000 mg by mouth 2 (two) times daily.    [provider]  amLODipine  (NORVASC ) 2.5 MG tablet Take 2.5 mg by mouth daily. 11/25/22   [provider]  Calcium  Carbonate-Vit D-Min (CALCIUM -VITAMIN D-MINERALS) 600-800 MG-UNIT CHEW Chew 1 tablet by mouth 2 (two) times daily.    [provider]  chlorhexidine (PERIDEX) 0.12 % solution Use as directed 15 mLs in the mouth or throat 2 (two) times daily. 11/24/22   [provider]  Dextran 70-Hypromellose (NATURAL BALANCE TEARS) 0.1-0.3 % SOLN  Place 1 drop into both eyes 2 (two) times daily as needed (dry eyes).    [provider]  diclofenac Sodium (VOLTAREN) 1 % GEL Apply 2 g topically at bedtime. To both ankles and feet.    [provider]  dorzolamide -timolol  (COSOPT ) 22.3-6.8 MG/ML ophthalmic solution Place 1 drop into both eyes 2 (two) times daily.  03/11/19   [provider]  Emollient (EUCERIN) lotion Apply 1 Application topically daily. To back and feet, dry skin    [provider]  ferrous sulfate 325 (65 FE) MG tablet Take 325 mg by mouth 3  (three) times a week. Monday, Wednesday, friday    [provider]  furosemide  (LASIX ) 20 MG tablet Take 10 mg by mouth every other day. 04/11/19   [provider]  gabapentin  (NEURONTIN ) 100 MG capsule Take 200 mg by mouth at bedtime. 05/11/20   [provider]  latanoprost  (XALATAN ) 0.005 % ophthalmic solution Place 1 drop into both eyes at bedtime. 05/25/19   [provider]  Lidocaine  4 % PTCH Place 2 patches onto the skin daily. Apply 1 patch topically to the mid back and 1 patch to right hip every morning and remove at night    [provider]  memantine  (NAMENDA ) 10 MG tablet Take 10 mg by mouth 2 (two) times daily.    [provider]  Menthol, Topical Analgesic, (BIOFREEZE) 4 % GEL Apply 1 application  topically 2 (two) times daily. To the left/right shoulder and left/right hip, thighs and both knees.    [provider]  omeprazole (PRILOSEC) 20 MG capsule Take 20 mg by mouth every other day. 02/22/20   [provider]  propranolol  (INDERAL ) 10 MG tablet Take 10 mg by mouth 2 (two) times daily.    [provider]  risperiDONE  (RISPERDAL ) 0.5 MG tablet Take by mouth. 06/15/23   [provider]  risperiDONE  (RISPERDAL ) 2 MG tablet Take 2 mg by mouth at bedtime. 11/19/22   [provider]  senna-docusate (SENOKOT-S) 8.6-50 MG tablet Take 1 tablet by mouth at bedtime. Patient taking differently: Take 1 tablet by mouth 2 (two) times daily. 11/11/16   Jerri Keys, MD  sertraline (ZOLOFT) 25 MG tablet Take 25 mg by mouth daily. 11/19/23   [provider]    Physical Exam: Vitals:   01/13/24 1730 01/13/24 1845 01/13/24 2119 01/13/24 2200  BP: (!) 195/63 (!) 177/74 (!) 172/88 (!) 154/72  Pulse: 79 92 81 84  Resp: (!) 21 10 16 18   Temp:   (!) 96 F (35.6 C)   TempSrc:   Axillary   SpO2: 100% 100% 100% 100%  Weight:      Height:        Constitutional: NAD, no pallor or diaphoresis  Eyes:  PERTLA, lids and conjunctivae normal ENMT: Mucous membranes are moist. Posterior pharynx clear of any exudate or lesions.   Neck: supple, no masses  Respiratory: no wheezing, no crackles. No accessory muscle use.  Cardiovascular: S1 & S2 heard, regular rate and rhythm. No extremity edema.  Abdomen: No tenderness, soft. Bowel sounds active.  Musculoskeletal: no clubbing / cyanosis. No joint deformity upper and lower extremities.   Skin: no significant rashes, lesions, ulcers. Warm, dry, well-perfused. Neurologic: Opens eyes to loud voice. No verbal response. Moves all extremities spontaneously.      Labs and Imaging on Admission: I have personally reviewed following labs and imaging studies  CBC: Recent Labs  Lab 01/13/24 1704 01/13/24 1738  WBC  --  5.6  NEUTROABS  --  2.8  HGB 13.9 12.3  HCT 41.0 38.3  MCV  --  74.4*  PLT  --  151   Basic Metabolic Panel: Recent Labs  Lab 01/13/24 1704 01/13/24 1840  NA 141 141  K 5.6* 4.2  CL 105 101  CO2  --  28  GLUCOSE 105* 109*  BUN 23 15  CREATININE 0.80 0.57  CALCIUM   --  9.3   GFR: Estimated Creatinine Clearance: 36.8 mL/min (by C-G formula based on SCr of 0.57 mg/dL). Liver Function Tests: Recent Labs  Lab 01/13/24 1840  AST 25  ALT 22  ALKPHOS 98  BILITOT 1.2  PROT 7.7  ALBUMIN 3.2*   Recent Labs  Lab 01/13/24 1840  LIPASE 22   No results for input(s): AMMONIA in the last 168 hours. Coagulation Profile: No results for input(s): INR, PROTIME in the last 168 hours. Cardiac Enzymes: No results for input(s): CKTOTAL, CKMB, CKMBINDEX, TROPONINI in the last 168 hours. BNP (last 3 results) No results for input(s): PROBNP in the last 8760 hours. HbA1C: No results for input(s): HGBA1C in the last 72 hours. CBG: No results for input(s): GLUCAP in the last 168 hours. Lipid Profile: No results for input(s): CHOL, HDL, LDLCALC, TRIG, CHOLHDL, LDLDIRECT in the last 72 hours. Thyroid   Function Tests: No results for input(s): TSH, T4TOTAL, FREET4, T3FREE, THYROIDAB in the last 72 hours. Anemia Panel: No results for input(s): VITAMINB12, FOLATE, FERRITIN, TIBC, IRON, RETICCTPCT in the last 72 hours. Urine analysis:    Component Value Date/Time   COLORURINE YELLOW 01/13/2024 1718   APPEARANCEUR CLEAR 01/13/2024 1718   LABSPEC 1.015 01/13/2024 1718   PHURINE 7.0 01/13/2024 1718   GLUCOSEU 100 (A) 01/13/2024 1718   HGBUR NEGATIVE 01/13/2024 1718   BILIRUBINUR NEGATIVE 01/13/2024 1718   KETONESUR NEGATIVE 01/13/2024 1718   PROTEINUR NEGATIVE 01/13/2024 1718   UROBILINOGEN 0.2 12/29/2018 1523   NITRITE NEGATIVE 01/13/2024 1718   LEUKOCYTESUR NEGATIVE 01/13/2024 1718   Sepsis Labs: @LABRCNTIP (procalcitonin:4,lacticidven:4) ) Recent Results (from the past 240 hours)  Resp panel by RT-PCR (RSV, Flu A&B, Covid) Anterior Nasal Swab     Status: None   Collection Time: 01/13/24  4:47 PM   Specimen: Anterior Nasal Swab  Result Value Ref Range Status   SARS Coronavirus 2 by RT PCR NEGATIVE NEGATIVE Final   Influenza A by PCR NEGATIVE NEGATIVE Final   Influenza B by PCR NEGATIVE NEGATIVE Final    Comment: (NOTE) The Xpert Xpress SARS-CoV-2/FLU/RSV plus assay is intended as an aid in the diagnosis of influenza from Nasopharyngeal swab specimens and should not be used as a sole basis for treatment. Nasal washings and aspirates are unacceptable for Xpert Xpress SARS-CoV-2/FLU/RSV testing.  Fact Sheet for Patients: bloggercourse.com  Fact Sheet for Healthcare Providers: seriousbroker.it  This test is not yet approved or cleared by the [place]  FDA and has been authorized for detection and/or diagnosis of SARS-CoV-2 by FDA under an Emergency Use Authorization (EUA). This EUA will remain in effect (meaning this test can be used) for the duration of the COVID-19 declaration under Section 564(b)(1)  of the Act, 21 U.S.C. section 360bbb-3(b)(1), unless the authorization is terminated or revoked.     Resp Syncytial Virus by PCR NEGATIVE NEGATIVE Final    Comment: (NOTE) Fact Sheet for Patients: bloggercourse.com  Fact Sheet for Healthcare Providers: seriousbroker.it  This test is not yet approved or cleared by the [place]  FDA and has been authorized  for detection and/or diagnosis of SARS-CoV-2 by FDA under an Emergency Use Authorization (EUA). This EUA will remain in effect (meaning this test can be used) for the duration of the COVID-19 declaration under Section 564(b)(1) of the Act, 21 U.S.C. section 360bbb-3(b)(1), unless the authorization is terminated or revoked.  Performed at [facility], [address] [place], [place] [address]      Radiological Exams on Admission: CT Head Wo Contrast Result Date: 01/13/2024 CLINICAL DATA:  Altered level of consciousness EXAM: CT HEAD WITHOUT CONTRAST TECHNIQUE: Contiguous axial images were obtained from the base of the skull through the vertex without intravenous contrast. RADIATION DOSE REDUCTION: This exam was performed according to the departmental dose-optimization program which includes automated exposure control, adjustment of the mA and/or kV according to patient size and/or use of iterative reconstruction technique. COMPARISON:  12/03/2022 FINDINGS: Brain: No acute infarct or hemorrhage. The lateral ventricles and midline structures are stable. Stable hypodensities in the periventricular white matter and bilateral basal ganglia consistent with chronic small vessel ischemic changes. No acute extra-axial fluid collections. No mass effect. Vascular: No hyperdense vessel or unexpected calcification. Skull: Normal. Negative for fracture or focal lesion. Sinuses/Orbits: Complete opacification of the right frontal, ethmoid, maxillary, and sphenoid sinuses. Mild mucosal thickening  in the left sphenoid sinus. Other: None. IMPRESSION: 1. No acute intracranial process. 2. Predominantly right-sided paranasal sinus disease as above. Electronically Signed   By: Ozell Daring M.D.   On: 01/13/2024 18:13   DG Chest 1 View Result Date: 01/13/2024 EXAM: 1 VIEW(S) XRAY OF THE CHEST 01/13/2024 05:26:16 PM COMPARISON: 07/27/2022 CLINICAL HISTORY: ams, cough FINDINGS: LUNGS AND PLEURA: No focal pulmonary opacity. No pleural effusion. No pneumothorax. HEART AND MEDIASTINUM: Mild cardiomegaly. No acute abnormality of the mediastinal silhouette. BONES AND SOFT TISSUES: No acute osseous abnormality. IMPRESSION: 1. No acute cardiopulmonary process identified. Electronically signed by: Lynwood Seip MD 01/13/2024 05:44 PM EST RP Workstation: [id number]    EKG: Independently reviewed. Sinus rhythm, 1st degree AV block.   Assessment/Plan   1. Acute encephalopathy - No acute findings on head CT and no significant abnormality on basic labs in ED  - Hold sedating medications, check EEG, TSH, ammonia, B12, folate, and RPR, use delirium precautions    2. Hypertension  - Treat as-needed only for now   3. Dementia  - Use delirium precautions, hold sedating medications for now    DVT prophylaxis: Lovenox   Code Status: DNR/DNI, confirmed on admission  Level of Care: Level of care: Telemetry Family Communication: Daughter at bedside  Disposition Plan:  Patient is from: SNF  Anticipated d/c is to: SNF  Anticipated d/c date is: Possibly as early as 01/14/24  Patient currently: Pending additional workup, disposition planning  Consults called: None  Admission status: Observation     Evalene GORMAN Sprinkles, MD [facility]  01/13/2024, 10:42 PM

## 2024-01-13 NOTE — ED Notes (Signed)
 Warm blankets applied

## 2024-01-13 NOTE — ED Notes (Signed)
 Patient cleaned up after bowel movement and positioned comfortably in bed. Rectal temp of 95.5. Beir hugger currently in use, multiple warm blankets placed on patient. In bed in no acute distress.

## 2024-01-13 NOTE — ED Provider Notes (Signed)
 [place] EMERGENCY DEPARTMENT AT [facility] Provider Note   CSN: [id number] Arrival date & time: 01/13/24  1556     Patient presents with: Altered Mental Status   Theresa Bauer is a 89 y.o. female.    89 year old female brought in by daughter for evaluation of altered mental status.  Daughter states that she may have been altered for about a week now.  States she has not been eating and drinking much and has not been getting out of bed.  She states patient has a history of dementia and sometimes knows who she is and where she is, but is usually more talkative than this.  She states patient has   not been talking, not been getting out of the bed.  She states that patient can usually ambulate with a walker without difficulty.  Denies any other symptoms or concern.  Patient is fairly somnolent but does open her eyes, does not follow commands, further history limited at this time   Altered Mental Status      Prior to Admission medications   Medication Sig Start Date End Date Taking? Authorizing Provider  acetaminophen  (TYLENOL ) 500 MG tablet Take 1,000 mg by mouth 2 (two) times daily.    [provider]  amLODipine  (NORVASC ) 2.5 MG tablet Take 2.5 mg by mouth daily. 11/25/22   [provider]  Calcium  Carbonate-Vit D-Min (CALCIUM -VITAMIN D-MINERALS) 600-800 MG-UNIT CHEW Chew 1 tablet by mouth 2 (two) times daily.    [provider]  chlorhexidine (PERIDEX) 0.12 % solution Use as directed 15 mLs in the mouth or throat 2 (two) times daily. 11/24/22   [provider]  Dextran 70-Hypromellose (NATURAL BALANCE TEARS) 0.1-0.3 % SOLN Place 1 drop into both eyes 2 (two) times daily as needed (dry eyes).    [provider]  diclofenac Sodium (VOLTAREN) 1 % GEL Apply 2 g topically at bedtime. To both ankles and feet.    [provider]  dorzolamide -timolol  (COSOPT ) 22.3-6.8 MG/ML ophthalmic solution Place 1 drop into both eyes 2 (two)  times daily.  03/11/19   [provider]  Emollient (EUCERIN) lotion Apply 1 Application topically daily. To back and feet, dry skin    [provider]  ferrous sulfate 325 (65 FE) MG tablet Take 325 mg by mouth 3 (three) times a week. Monday, Wednesday, friday    [provider]  furosemide  (LASIX ) 20 MG tablet Take 10 mg by mouth every other day. 04/11/19   [provider]  gabapentin  (NEURONTIN ) 100 MG capsule Take 200 mg by mouth at bedtime. 05/11/20   [provider]  latanoprost  (XALATAN ) 0.005 % ophthalmic solution Place 1 drop into both eyes at bedtime. 05/25/19   [provider]  Lidocaine  4 % PTCH Place 2 patches onto the skin daily. Apply 1 patch topically to the mid back and 1 patch to right hip every morning and remove at night    [provider]  memantine  (NAMENDA ) 10 MG tablet Take 10 mg by mouth 2 (two) times daily.    [provider]  Menthol, Topical Analgesic, (BIOFREEZE) 4 % GEL Apply 1 application  topically 2 (two) times daily. To the left/right shoulder and left/right hip, thighs and both knees.    [provider]  omeprazole (PRILOSEC) 20 MG capsule Take 20 mg by mouth every other day. 02/22/20   [provider]  propranolol  (INDERAL ) 10 MG tablet Take 10 mg by mouth 2 (two) times daily.  [provider]  risperiDONE  (RISPERDAL ) 0.5 MG tablet Take by mouth. 06/15/23   [provider]  risperiDONE  (RISPERDAL ) 2 MG tablet Take 2 mg by mouth at bedtime. 11/19/22   [provider]  senna-docusate (SENOKOT-S) 8.6-50 MG tablet Take 1 tablet by mouth at bedtime. Patient taking differently: Take 1 tablet by mouth 2 (two) times daily. 11/11/16   Jerri Keys, MD  sertraline (ZOLOFT) 25 MG tablet Take 25 mg by mouth daily. 11/19/23   [provider]    Allergies: Lisinopril, Darvocet [propoxyphene n-acetaminophen ], Aspirin, Brimonidine, Penicillin g, Penicillins, and  Darvon [propoxyphene]    Review of Systems  Reason unable to perform ROS: dementia, ams.  All other systems reviewed and are negative.   Updated Vital Signs BP (!) 154/72   Pulse 84   Temp (!) 96 F (35.6 C) (Axillary)   Resp 18   Ht 5' (1.524 m)   Wt 61.7 kg   SpO2 100%   BMI 26.57 kg/m   Physical Exam Vitals and nursing note reviewed.  Constitutional:      General: She is not in acute distress.    Appearance: Normal appearance. She is well-developed. She is ill-appearing.  HENT:     Head: Normocephalic and atraumatic.  Eyes:     Conjunctiva/sclera: Conjunctivae normal.  Cardiovascular:     Rate and Rhythm: Normal rate and regular rhythm.     Heart sounds: No murmur heard. Pulmonary:     Effort: Pulmonary effort is normal. No respiratory distress.     Breath sounds: Normal breath sounds.  Abdominal:     Palpations: Abdomen is soft.     Tenderness: There is no abdominal tenderness.  Musculoskeletal:        General: No swelling.     Cervical back: Neck supple.  Skin:    General: Skin is warm and dry.     Capillary Refill: Capillary refill takes less than 2 seconds.  Neurological:     Mental Status: She is alert.     Comments: Does not follow commands, does move extremities spontaneously, does not speak, somnolent      (all labs ordered are listed, but only abnormal results are displayed) Labs Reviewed  CBC WITH DIFFERENTIAL/PLATELET - Abnormal; Notable for the following components:      Result Value   RBC 5.15 (*)    MCV 74.4 (*)    MCH 23.9 (*)    RDW 17.7 (*)    nRBC 0.4 (*)    All other components within normal limits  URINALYSIS, W/ REFLEX TO CULTURE (INFECTION SUSPECTED) - Abnormal; Notable for the following components:   Glucose, UA 100 (*)    All other components within normal limits  BASIC METABOLIC PANEL WITH GFR - Abnormal; Notable for the following components:   Glucose, Bld 109 (*)    All other components within normal limits  HEPATIC  FUNCTION PANEL - Abnormal; Notable for the following components:   Albumin 3.2 (*)    Indirect Bilirubin 1.0 (*)    All other components within normal limits  I-STAT CHEM 8, ED - Abnormal; Notable for the following components:   Potassium 5.6 (*)    Glucose, Bld 105 (*)    Calcium , Ion 1.10 (*)    All other components within normal limits  RESP PANEL BY RT-PCR (RSV, FLU A&B, COVID)  RVPGX2  LIPASE, BLOOD  TSH  AMMONIA  VITAMIN B12  FOLATE  SYPHILIS: RPR W/REFLEX TO RPR TITER AND TREPONEMAL ANTIBODIES, TRADITIONAL  SCREENING AND DIAGNOSIS ALGORITHM  BASIC METABOLIC PANEL WITH GFR  CBC  I-STAT CG4 LACTIC ACID, ED  TROPONIN I (HIGH SENSITIVITY)  TROPONIN I (HIGH SENSITIVITY)    EKG: None  Radiology: CT Head Wo Contrast Result Date: 01/13/2024 CLINICAL DATA:  Altered level of consciousness EXAM: CT HEAD WITHOUT CONTRAST TECHNIQUE: Contiguous axial images were obtained from the base of the skull through the vertex without intravenous contrast. RADIATION DOSE REDUCTION: This exam was performed according to the departmental dose-optimization program which includes automated exposure control, adjustment of the mA and/or kV according to patient size and/or use of iterative reconstruction technique. COMPARISON:  12/03/2022 FINDINGS: Brain: No acute infarct or hemorrhage. The lateral ventricles and midline structures are stable. Stable hypodensities in the periventricular white matter and bilateral basal ganglia consistent with chronic small vessel ischemic changes. No acute extra-axial fluid collections. No mass effect. Vascular: No hyperdense vessel or unexpected calcification. Skull: Normal. Negative for fracture or focal lesion. Sinuses/Orbits: Complete opacification of the right frontal, ethmoid, maxillary, and sphenoid sinuses. Mild mucosal thickening in the left sphenoid sinus. Other: None. IMPRESSION: 1. No acute intracranial process. 2. Predominantly right-sided paranasal sinus disease as  above. Electronically Signed   By: Ozell Daring M.D.   On: 01/13/2024 18:13   DG Chest 1 View Result Date: 01/13/2024 EXAM: 1 VIEW(S) XRAY OF THE CHEST 01/13/2024 05:26:16 PM COMPARISON: 07/27/2022 CLINICAL HISTORY: ams, cough FINDINGS: LUNGS AND PLEURA: No focal pulmonary opacity. No pleural effusion. No pneumothorax. HEART AND MEDIASTINUM: Mild cardiomegaly. No acute abnormality of the mediastinal silhouette. BONES AND SOFT TISSUES: No acute osseous abnormality. IMPRESSION: 1. No acute cardiopulmonary process identified. Electronically signed by: Lynwood Seip MD 01/13/2024 05:44 PM EST RP Workstation: [id number]     Procedures   Medications Ordered in the ED  enoxaparin  (LOVENOX ) injection 40 mg (has no administration in time range)  sodium chloride  flush (NS) 0.9 % injection 3 mL (has no administration in time range)  acetaminophen  (TYLENOL ) tablet 650 mg (has no administration in time range)    Or  acetaminophen  (TYLENOL ) suppository 650 mg (has no administration in time range)  0.45 % sodium chloride  infusion (has no administration in time range)  ondansetron  (ZOFRAN ) tablet 4 mg (has no administration in time range)    Or  ondansetron  (ZOFRAN ) injection 4 mg (has no administration in time range)  lactated ringers bolus 500 mL (0 mLs Intravenous Stopped 01/13/24 1740)                                    Medical Decision Making Cardiac monitor: sinus rhythm, no ectopy   Social determinants of health: lives at a nursing home, dementia   Patient brought in by family member for altered mental status.  It sounds like she has had a fairly rapid decline in about a week.  Has not been eating or drinking, not walking and not talking either.  Workup largely negative here.  Vitals are fairly stable.  I discussed patient's case with hospitalist the patient to be admitted for further workup and management.  All results and plan discussed in with patient and family at bedside.  Family members  agreeable with the plan for admission.  Problems Addressed: Altered mental status, unspecified altered mental status type: undiagnosed new problem with uncertain prognosis  Amount and/or Complexity of Data Reviewed External Data Reviewed: notes.    Details: Prior ED records reviewed-patient seen in 9 days  for congestion-hypersomnolence Labs: ordered. Decision-making details documented in ED Course.    Details: Ordered and reviewed by me and unremarkable Radiology: ordered and independent interpretation performed. Decision-making details documented in ED Course.    Details: Ordered and interpreted by me independently of radiology Chest x-ray: Shows no acute abnormality CT head: Shows age-related changes but no acute abnormality ECG/medicine tests: ordered and independent interpretation performed.    Details: Ordered and interpreted by me in the absence of cardiology and shows sinus rhythm, no STEMI, or significant change when compared to prior EKG Discussion of management or test interpretation with external provider(s): Dr. Charlton -hospitalist-I spoke on phone regarding patient's case and he will admit the patient for further workup and management  Risk OTC drugs. Prescription drug management. Drug therapy requiring intensive monitoring for toxicity. Decision regarding hospitalization. Diagnosis or treatment significantly limited by social determinants of health.     Final diagnoses:  Altered mental status, unspecified altered mental status type    ED Discharge Orders     None          Gennaro Duwaine CROME, DO 01/13/24 2303

## 2024-01-13 NOTE — ED Triage Notes (Signed)
 Patient arrives via [facility] from [facility], ams x2 days-1 week. Patient ambulatory usually. Patient able to follow commands. Staff says bowel movements are normal as well as urinary pattern. Patient usually alert and oriented. Responsive to pain. 20 LAC. No meds in route.   EMS vitals 158/58 HR 66 100 on room air CBG 110 Temp 97.7

## 2024-01-14 ENCOUNTER — Inpatient Hospital Stay (HOSPITAL_COMMUNITY)

## 2024-01-14 DIAGNOSIS — E869 Volume depletion, unspecified: Secondary | ICD-10-CM | POA: Diagnosis present

## 2024-01-14 DIAGNOSIS — R4182 Altered mental status, unspecified: Secondary | ICD-10-CM | POA: Diagnosis not present

## 2024-01-14 DIAGNOSIS — R569 Unspecified convulsions: Secondary | ICD-10-CM

## 2024-01-14 DIAGNOSIS — Z1152 Encounter for screening for COVID-19: Secondary | ICD-10-CM | POA: Diagnosis not present

## 2024-01-14 DIAGNOSIS — Y92129 Unspecified place in nursing home as the place of occurrence of the external cause: Secondary | ICD-10-CM | POA: Diagnosis not present

## 2024-01-14 DIAGNOSIS — T68XXXD Hypothermia, subsequent encounter: Secondary | ICD-10-CM

## 2024-01-14 DIAGNOSIS — Z888 Allergy status to other drugs, medicaments and biological substances status: Secondary | ICD-10-CM | POA: Diagnosis not present

## 2024-01-14 DIAGNOSIS — I440 Atrioventricular block, first degree: Secondary | ICD-10-CM | POA: Diagnosis present

## 2024-01-14 DIAGNOSIS — Z66 Do not resuscitate: Secondary | ICD-10-CM | POA: Diagnosis present

## 2024-01-14 DIAGNOSIS — R680 Hypothermia, not associated with low environmental temperature: Secondary | ICD-10-CM | POA: Diagnosis present

## 2024-01-14 DIAGNOSIS — K219 Gastro-esophageal reflux disease without esophagitis: Secondary | ICD-10-CM | POA: Diagnosis present

## 2024-01-14 DIAGNOSIS — E162 Hypoglycemia, unspecified: Secondary | ICD-10-CM | POA: Diagnosis present

## 2024-01-14 DIAGNOSIS — Z880 Allergy status to penicillin: Secondary | ICD-10-CM | POA: Diagnosis not present

## 2024-01-14 DIAGNOSIS — T438X5A Adverse effect of other psychotropic drugs, initial encounter: Secondary | ICD-10-CM | POA: Diagnosis present

## 2024-01-14 DIAGNOSIS — R0683 Snoring: Secondary | ICD-10-CM | POA: Diagnosis present

## 2024-01-14 DIAGNOSIS — G9340 Encephalopathy, unspecified: Secondary | ICD-10-CM | POA: Diagnosis present

## 2024-01-14 DIAGNOSIS — Z886 Allergy status to analgesic agent status: Secondary | ICD-10-CM | POA: Diagnosis not present

## 2024-01-14 DIAGNOSIS — G928 Other toxic encephalopathy: Secondary | ICD-10-CM | POA: Diagnosis present

## 2024-01-14 DIAGNOSIS — Z8249 Family history of ischemic heart disease and other diseases of the circulatory system: Secondary | ICD-10-CM | POA: Diagnosis not present

## 2024-01-14 DIAGNOSIS — I10 Essential (primary) hypertension: Secondary | ICD-10-CM | POA: Diagnosis present

## 2024-01-14 DIAGNOSIS — G4710 Hypersomnia, unspecified: Secondary | ICD-10-CM | POA: Diagnosis present

## 2024-01-14 DIAGNOSIS — Z9842 Cataract extraction status, left eye: Secondary | ICD-10-CM | POA: Diagnosis not present

## 2024-01-14 DIAGNOSIS — F03918 Unspecified dementia, unspecified severity, with other behavioral disturbance: Secondary | ICD-10-CM | POA: Diagnosis present

## 2024-01-14 DIAGNOSIS — Z96653 Presence of artificial knee joint, bilateral: Secondary | ICD-10-CM | POA: Diagnosis present

## 2024-01-14 DIAGNOSIS — R5383 Other fatigue: Secondary | ICD-10-CM | POA: Diagnosis present

## 2024-01-14 DIAGNOSIS — Z79899 Other long term (current) drug therapy: Secondary | ICD-10-CM | POA: Diagnosis not present

## 2024-01-14 LAB — TSH: TSH: 7.061 u[IU]/mL — ABNORMAL HIGH (ref 0.350–4.500)

## 2024-01-14 LAB — BASIC METABOLIC PANEL WITH GFR
Anion gap: 8 (ref 5–15)
BUN: 13 mg/dL (ref 8–23)
CO2: 29 mmol/L (ref 22–32)
Calcium: 8.9 mg/dL (ref 8.9–10.3)
Chloride: 102 mmol/L (ref 98–111)
Creatinine, Ser: 0.55 mg/dL (ref 0.44–1.00)
GFR, Estimated: >60 mL/min (ref >60)
Glucose, Bld: 116 mg/dL — ABNORMAL HIGH (ref 70–99)
Potassium: 4.1 mmol/L (ref 3.5–5.1)
Sodium: 139 mmol/L (ref 135–145)

## 2024-01-14 LAB — BLOOD GAS, VENOUS
Acid-Base Excess: 7.4 mmol/L — ABNORMAL HIGH (ref 0.0–2.0)
Bicarbonate: 32 mmol/L — ABNORMAL HIGH (ref 20.0–28.0)
Drawn by: 76794
O2 Saturation: 81.2 %
Patient temperature: 36.9
pCO2, Ven: 44 mmHg (ref 44–60)
pH, Ven: 7.47 — ABNORMAL HIGH (ref 7.25–7.43)
pO2, Ven: 47 mmHg — ABNORMAL HIGH (ref 32–45)

## 2024-01-14 LAB — RAPID URINE DRUG SCREEN, HOSP PERFORMED
Amphetamines: NOT DETECTED
Barbiturates: NOT DETECTED
Benzodiazepines: NOT DETECTED
Cocaine: NOT DETECTED
Opiates: NOT DETECTED
Tetrahydrocannabinol: NOT DETECTED

## 2024-01-14 LAB — CBC
HCT: 39 % (ref 36.0–46.0)
Hemoglobin: 12.5 g/dL (ref 12.0–15.0)
MCH: 23.5 pg — ABNORMAL LOW (ref 26.0–34.0)
MCHC: 32.1 g/dL (ref 30.0–36.0)
MCV: 73.4 fL — ABNORMAL LOW (ref 80.0–100.0)
Platelets: 172 K/uL (ref 150–400)
RBC: 5.31 MIL/uL — ABNORMAL HIGH (ref 3.87–5.11)
RDW: 17.1 % — ABNORMAL HIGH (ref 11.5–15.5)
WBC: 8.1 K/uL (ref 4.0–10.5)
nRBC: 0 % (ref 0.0–0.2)

## 2024-01-14 LAB — VITAMIN B12: Vitamin B-12: 1244 pg/mL — ABNORMAL HIGH (ref 180–914)

## 2024-01-14 LAB — GLUCOSE, CAPILLARY: Glucose-Capillary: 91 mg/dL (ref 70–99)

## 2024-01-14 LAB — FOLATE: Folate: 9.8 ng/mL (ref >5.9)

## 2024-01-14 LAB — CORTISOL: Cortisol, Plasma: 32.6 ug/dL

## 2024-01-14 LAB — AMMONIA: Ammonia: 24 umol/L (ref 9–35)

## 2024-01-14 LAB — T4, FREE: Free T4: 1.1 ng/dL (ref 0.61–1.12)

## 2024-01-14 LAB — SYPHILIS: RPR W/REFLEX TO RPR TITER AND TREPONEMAL ANTIBODIES, TRADITIONAL SCREENING AND DIAGNOSIS ALGORITHM: RPR Ser Ql: NONREACTIVE

## 2024-01-14 MED ORDER — LATANOPROST 0.005 % OP SOLN
1.0000 [drp] | Freq: Every day | OPHTHALMIC | Status: DC
  Administered 2024-01-14 – 2024-01-17 (×4): 1 [drp] via OPHTHALMIC
  Filled 2024-01-14: qty 2.5

## 2024-01-14 MED ORDER — DORZOLAMIDE HCL-TIMOLOL MAL 2-0.5 % OP SOLN
1.0000 [drp] | Freq: Two times a day (BID) | OPHTHALMIC | Status: DC
  Administered 2024-01-14 – 2024-01-18 (×7): 1 [drp] via OPHTHALMIC
  Filled 2024-01-14: qty 10

## 2024-01-14 NOTE — ED Notes (Signed)
 Voicemail left for EEG per Hospitalist request.

## 2024-01-14 NOTE — Progress Notes (Addendum)
 PROGRESS NOTE   Theresa Bauer  FMW:[id number]    DOB: 05-29-1929    DOA: 01/13/2024  PCP: Feliciano Devoria LABOR, MD   I have briefly reviewed patients previous medical records in [facility].   Brief Hospital Course:  89 year old female, medical history significant for dementia, hypertension, GERD, presented to ED due to progressive lethargy, somnolence, poor oral intake and not getting out of bed, worse from her baseline.  At her baseline, she reportedly ambulates with a walker, conversant, intermittent confusion.  Admitted for acute encephalopathy of unclear etiology.  Extensive workup thus far unremarkable.  Hypothermic on admission which has since resolved.   Assessment & Plan:   Acute encephalopathy Etiology unclear.  Extensive evaluation thus far negative CMP, CBC, serial troponin high high-sensitivity, folate, RPR, UA, UDS negative.  CT head without acute findings.  Chest x-ray negative.  VBG without CO2 retention.  Mildly elevated TSH/7.061 but free T4 normal and this is not likely the cause.  Random plasma cortisol 32.6.  B12 level elevated. Follow EEG. Family feels that this may be due to medications which is possible. Delirium precautions.  For now hold home Risperdal  and Prozac and monitor.  If does not start to improve then could consider MRI brain. Although hypothermic for several hours on admission, this in itself probably not the cause of her AMS that has been going on for about a week PTA.  Not much improvement after euthermic. Avoid sedatives and opioids. Until safe to take p.o., continue IVF. Noted some cogwheel rigidity,?  Parkinsonian.  There is a send to give Benadryl given her sedation.  Discussed in detail with patient's daughter 12/11: Patient resides at [facility], [place], has been getting Risperdal  until PTA.  She reported similar presentation in September 2025, evaluated and discharged from ED at which point she was on Seroquel.  Requested MRI brain to rule out  stroke.  Hypothermia Rectal temperature as low as 95.5 F. Infectious and metabolic workup as above unrevealing for cause Placed on Bair hugger in ED for a couple of hours and hypothermia resolved.  Bair hugger has been removed now for several hours.  Essential hypertension Controlled off of meds for now.  As needed antihypertensives (on Imdur and Lasix  PTA)  Dementia with behavioral disturbances Suspect that she may have had behavioral disturbances while she is on Risperdal  at home.  Hold Risperdal , Namenda  and gabapentin .   Body mass index is 26.57 kg/m.   DVT prophylaxis: enoxaparin  (LOVENOX ) injection 40 mg Start: 01/14/24 1000     Code Status: Limited: Do not attempt resuscitation (DNR) -DNR-LIMITED -Do Not Intubate/DNI :  Family Communication: Daughter via phone 12/11. Disposition:  Status is: Observation The patient will require care spanning > 2 midnights and should be moved to inpatient because: Remains encephalopathic, unsafe to take p.o. when evaluated this morning, continue IVF and monitor.     Consultants:   None  Procedures:     Subjective:  Seen this morning while still in ED.  Remains sedated/very less responsive.  No response to call.  Through noxious stimuli, localizes with both upper extremity, winces.  No verbal response or eye-opening.  Objective:   Vitals:   01/14/24 0700 01/14/24 0730 01/14/24 0755 01/14/24 0834  BP:   (!) 111/49 (!) 112/50  Pulse: 94 92 91 95  Resp: 20 15 20 20   Temp:    98.5 F (36.9 C)  TempSrc:    Axillary  SpO2: 100% 100% 100% 99%  Weight:  Height:        General exam: Very elderly female, moderately built and nourished lying comfortably propped up in bed without distress.  Mouth breathing and noted to be snoring. Respiratory system: Clear to auscultation. Respiratory effort normal. Cardiovascular system: S1 & S2 heard, RRR. No JVD, murmurs, rubs, gallops or clicks. No pedal edema.  Telemetry personally reviewed:  Sinus rhythm. Gastrointestinal system: Abdomen is nondistended, soft and nontender. No organomegaly or masses felt. Normal bowel sounds heard. Central nervous system: Mental status as noted above. No focal neurological deficits. Extremities: Localizes to pain with bilateral upper extremities.  Noted some cogwheel rigidity of bilateral upper extremities/wrists and elbow. Skin: No rashes, lesions or ulcers Psychiatry: Judgement and insight impaired. Mood & affect cannot be assessed.    Data Reviewed:   I have personally reviewed following labs and imaging studies   CBC: Recent Labs  Lab 01/13/24 1704 01/13/24 1738 01/14/24 0408  WBC  --  5.6 8.1  NEUTROABS  --  2.8  --   HGB 13.9 12.3 12.5  HCT 41.0 38.3 39.0  MCV  --  74.4* 73.4*  PLT  --  151 172    Basic Metabolic Panel: Recent Labs  Lab 01/13/24 1704 01/13/24 1840 01/14/24 0408  NA 141 141 139  K 5.6* 4.2 4.1  CL 105 101 102  CO2  --  28 29  GLUCOSE 105* 109* 116*  BUN 23 15 13   CREATININE 0.80 0.57 0.55  CALCIUM   --  9.3 8.9    Liver Function Tests: Recent Labs  Lab 01/13/24 1840  AST 25  ALT 22  ALKPHOS 98  BILITOT 1.2  PROT 7.7  ALBUMIN 3.2*    CBG: No results for input(s): GLUCAP in the last 168 hours.  Microbiology Studies:   Recent Results (from the past 240 hours)  Resp panel by RT-PCR (RSV, Flu A&B, Covid) Anterior Nasal Swab     Status: None   Collection Time: 01/13/24  4:47 PM   Specimen: Anterior Nasal Swab  Result Value Ref Range Status   SARS Coronavirus 2 by RT PCR NEGATIVE NEGATIVE Final   Influenza A by PCR NEGATIVE NEGATIVE Final   Influenza B by PCR NEGATIVE NEGATIVE Final    Comment: (NOTE) The Xpert Xpress SARS-CoV-2/FLU/RSV plus assay is intended as an aid in the diagnosis of influenza from Nasopharyngeal swab specimens and should not be used as a sole basis for treatment. Nasal washings and aspirates are unacceptable for Xpert Xpress SARS-CoV-2/FLU/RSV testing.  Fact  Sheet for Patients: bloggercourse.com  Fact Sheet for Healthcare Providers: seriousbroker.it  This test is not yet approved or cleared by the [place]  FDA and has been authorized for detection and/or diagnosis of SARS-CoV-2 by FDA under an Emergency Use Authorization (EUA). This EUA will remain in effect (meaning this test can be used) for the duration of the COVID-19 declaration under Section 564(b)(1) of the Act, 21 U.S.C. section 360bbb-3(b)(1), unless the authorization is terminated or revoked.     Resp Syncytial Virus by PCR NEGATIVE NEGATIVE Final    Comment: (NOTE) Fact Sheet for Patients: bloggercourse.com  Fact Sheet for Healthcare Providers: seriousbroker.it  This test is not yet approved or cleared by the [place]  FDA and has been authorized for detection and/or diagnosis of SARS-CoV-2 by FDA under an Emergency Use Authorization (EUA). This EUA will remain in effect (meaning this test can be used) for the duration of the COVID-19 declaration under Section 564(b)(1) of the Act, 21 U.S.C. section  360bbb-3(b)(1), unless the authorization is terminated or revoked.  Performed at [facility], [address], [place], [place] [address]     Radiology Studies:  CT Head Wo Contrast Result Date: 01/13/2024 CLINICAL DATA:  Altered level of consciousness EXAM: CT HEAD WITHOUT CONTRAST TECHNIQUE: Contiguous axial images were obtained from the base of the skull through the vertex without intravenous contrast. RADIATION DOSE REDUCTION: This exam was performed according to the departmental dose-optimization program which includes automated exposure control, adjustment of the mA and/or kV according to patient size and/or use of iterative reconstruction technique. COMPARISON:  12/03/2022 FINDINGS: Brain: No acute infarct or hemorrhage. The lateral ventricles and midline  structures are stable. Stable hypodensities in the periventricular white matter and bilateral basal ganglia consistent with chronic small vessel ischemic changes. No acute extra-axial fluid collections. No mass effect. Vascular: No hyperdense vessel or unexpected calcification. Skull: Normal. Negative for fracture or focal lesion. Sinuses/Orbits: Complete opacification of the right frontal, ethmoid, maxillary, and sphenoid sinuses. Mild mucosal thickening in the left sphenoid sinus. Other: None. IMPRESSION: 1. No acute intracranial process. 2. Predominantly right-sided paranasal sinus disease as above. Electronically Signed   By: Ozell Daring M.D.   On: 01/13/2024 18:13   DG Chest 1 View Result Date: 01/13/2024 EXAM: 1 VIEW(S) XRAY OF THE CHEST 01/13/2024 05:26:16 PM COMPARISON: 07/27/2022 CLINICAL HISTORY: ams, cough FINDINGS: LUNGS AND PLEURA: No focal pulmonary opacity. No pleural effusion. No pneumothorax. HEART AND MEDIASTINUM: Mild cardiomegaly. No acute abnormality of the mediastinal silhouette. BONES AND SOFT TISSUES: No acute osseous abnormality. IMPRESSION: 1. No acute cardiopulmonary process identified. Electronically signed by: Lynwood Seip MD 01/13/2024 05:44 PM EST RP Workstation: [id number]    Scheduled Meds:    enoxaparin  (LOVENOX ) injection  40 mg Subcutaneous Q24H   sodium chloride  flush  3 mL Intravenous Q12H    Continuous Infusions:    sodium chloride  75 mL/hr at 01/13/24 2311     LOS: 0 days     Trenda Mar, MD,  FACP, [facility], [facility], [facility]   [facility] [place]      To contact the attending provider between 7A-7P or the covering provider during after hours 7P-7A, please log into the web site [email or web address] and access using universal [place] password for that web site. If you do not have the password, please call the hospital operator.  01/14/2024, 9:37 AM

## 2024-01-14 NOTE — ED Notes (Signed)
 Lab called regarding UDS and will add it on.

## 2024-01-14 NOTE — Procedures (Signed)
 Patient Name: KIALA FARAJ  MRN: [id number]  Epilepsy Attending: Arlin MALVA Krebs  Referring Physician/Provider: Charlton Evalene RAMAN, MD  Date: 01/14/2024 Duration: 34.05 mins  Patient history: 89yo F with ams, EEG to evaluate for seizure  Level of alertness: Awake/ lethargic   AEDs during EEG study: None  Technical aspects: This EEG study was done with scalp electrodes positioned according to the 10-20 International system of electrode placement. Electrical activity was reviewed with band pass filter of 1-70Hz , sensitivity of 7 uV/mm, display speed of 58mm/sec with a 60Hz  notched filter applied as appropriate. EEG data were recorded continuously and digitally stored.  Video monitoring was available and reviewed as appropriate.  Description:  EEG showed continuous generalized predominantly 5 to 6 Hz theta slowing admixed with intermittent 2-3hz  delta slowing. Hyperventilation and photic stimulation were not performed.      ABNORMALITY - Continuous slow, generalized   IMPRESSION: This study is suggestive of generalized cerebral dysfunction ( encephalopathy). No seizures or epileptiform discharges were seen throughout the recording.   Jeraldin Fesler O Celia Gibbons

## 2024-01-14 NOTE — ED Notes (Signed)
 Pt back from MRI, lvm x2 for EEG

## 2024-01-14 NOTE — ED Notes (Signed)
 Pt transported to MRI

## 2024-01-14 NOTE — ED Notes (Signed)
 Notified Dr. Judeth regarding pt's alertness, difficult to arouse from slumber, grunting in response to questions, falling asleep quickly. Dr. Judeth aware of pt status and reports the same findings.

## 2024-01-14 NOTE — Progress Notes (Signed)
 Routine EEG completed, results pending Neurology review and interpretation

## 2024-01-14 NOTE — ED Notes (Signed)
 Patient had bowel movement. 2 RN's cleaned patient up and changed the soiled brief and pad.

## 2024-01-14 NOTE — Progress Notes (Signed)
°   01/14/24 1814  Assess: MEWS Score  Temp 98.9 F (37.2 C)  BP 124/75  MAP (mmHg) 86  Pulse Rate (!) 59  Resp 17  Level of Consciousness Responds to Pain  SpO2 (!) 86 %  O2 Device Room Air  Assess: MEWS Score  MEWS Temp 0  MEWS Systolic 0  MEWS Pulse 0  MEWS RR 0  MEWS LOC 2  MEWS Score 2  MEWS Score Color Yellow  Assess: if the MEWS score is Yellow or Red  Were vital signs accurate and taken at a resting state? Yes  Does the patient meet 2 or more of the SIRS criteria? No  MEWS guidelines implemented  Yes, yellow  Treat  MEWS Interventions Considered administering scheduled or prn medications/treatments as ordered  Take Vital Signs  Increase Vital Sign Frequency  Yellow: Q2hr x1, continue Q4hrs until patient remains green for 12hrs  Escalate  MEWS: Escalate Yellow: Discuss with charge nurse and consider notifying provider and/or RRT  Notify: Charge Nurse/RN  Name of Charge Nurse/RN Notified Jill,RN  Provider Notification  Provider Name/Title Hongalgi,MD  Date Provider Notified 01/14/24  Time Provider Notified 1823  Method of Notification Page  Notification Reason Other (Comment) (Yellow MEWS)  Provider response No new orders  Date of Provider Response 01/14/24  Time of Provider Response 1833  Assess: SIRS CRITERIA  SIRS Temperature  0  SIRS Respirations  0  SIRS Pulse 0  SIRS WBC 0  SIRS Score Sum  0

## 2024-01-14 NOTE — ED Notes (Signed)
 Bair hugger placed on patient.

## 2024-01-14 NOTE — Progress Notes (Signed)
 Pt arrived to 6 north room 6 from ED. Pt is very sleepy, snoring and not wanting to wake up. 0.45% sodium chloride  running continuous at 87mL/hr. Bed in lowest position. Call light in reach. Bed alarm on. Fall bractlet placed on pt. Floor mats on both sides of bed. All needs met at this time.

## 2024-01-15 DIAGNOSIS — E162 Hypoglycemia, unspecified: Secondary | ICD-10-CM

## 2024-01-15 LAB — GLUCOSE, CAPILLARY
Glucose-Capillary: 69 mg/dL — ABNORMAL LOW (ref 70–99)
Glucose-Capillary: 107 mg/dL — ABNORMAL HIGH (ref 70–99)
Glucose-Capillary: 175 mg/dL — ABNORMAL HIGH (ref 70–99)
Glucose-Capillary: 69 mg/dL — ABNORMAL LOW (ref 70–99)
Glucose-Capillary: 71 mg/dL (ref 70–99)
Glucose-Capillary: 85 mg/dL (ref 70–99)
Glucose-Capillary: 98 mg/dL (ref 70–99)

## 2024-01-15 MED ORDER — MEMANTINE HCL 10 MG PO TABS
10.0000 mg | ORAL_TABLET | Freq: Two times a day (BID) | ORAL | Status: DC
  Administered 2024-01-15 – 2024-01-16 (×2): 10 mg via ORAL
  Filled 2024-01-15 (×3): qty 1

## 2024-01-15 MED ORDER — DIPHENHYDRAMINE HCL 50 MG/ML IJ SOLN
12.5000 mg | Freq: Once | INTRAMUSCULAR | Status: AC
  Administered 2024-01-15: 12.5 mg via INTRAVENOUS
  Filled 2024-01-15: qty 1

## 2024-01-15 MED ORDER — DEXTROSE 50 % IV SOLN
INTRAVENOUS | Status: AC
  Administered 2024-01-15: 50 mL
  Filled 2024-01-15: qty 50

## 2024-01-15 MED ORDER — SODIUM CHLORIDE 0.9 % IV SOLN: INTRAVENOUS | Status: DC

## 2024-01-15 MED ORDER — RISPERIDONE 1 MG PO TABS
1.0000 mg | ORAL_TABLET | Freq: Every day | ORAL | Status: DC
  Administered 2024-01-16 – 2024-01-17 (×2): 1 mg via ORAL
  Filled 2024-01-15 (×3): qty 1

## 2024-01-15 MED ORDER — DEXTROSE 50 % IV SOLN: 12.5000 g | INTRAVENOUS | Status: AC

## 2024-01-15 MED ORDER — GABAPENTIN 100 MG PO CAPS
100.0000 mg | ORAL_CAPSULE | Freq: Every day | ORAL | Status: DC
  Administered 2024-01-16 – 2024-01-17 (×2): 100 mg via ORAL
  Filled 2024-01-15 (×2): qty 1

## 2024-01-15 MED ORDER — HALOPERIDOL LACTATE 5 MG/ML IJ SOLN
1.0000 mg | Freq: Once | INTRAMUSCULAR | Status: AC
  Administered 2024-01-15: 1 mg via INTRAVENOUS
  Filled 2024-01-15: qty 1

## 2024-01-15 MED ORDER — DEXTROSE 50 % IV SOLN
INTRAVENOUS | Status: AC
  Administered 2024-01-15: 12.5 g via INTRAVENOUS
  Filled 2024-01-15: qty 50

## 2024-01-15 NOTE — Progress Notes (Signed)
 Patient refused all medications and her glucose check this morning. When attempting to give medications and check glucose patient repeatedly said no, no, no while grabbing my hands. RONAL Mar MD notified and aware.

## 2024-01-15 NOTE — Progress Notes (Signed)
 Hypoglycemic Event  CBG: 69  Treatment: D50 25 mL (12.5 gm)  Symptoms: None  Follow-up CBG: [id number] CBG Result:107  Possible Reasons for Event: Other: NPO due to somnolence  Comments/MD notified:Natalie M Donati-Garmon, NP    Harlene A Simpson-Farrakhan

## 2024-01-15 NOTE — Progress Notes (Signed)
°   01/15/24 0200  Assess: MEWS Score  Level of Consciousness Responds to Pain  O2 Device Room Air  Patient Activity (if Appropriate) In bed  Assess: if the MEWS score is Yellow or Red  Were vital signs accurate and taken at a resting state? Yes

## 2024-01-15 NOTE — Progress Notes (Signed)
 PROGRESS NOTE   ELLEAN Bauer  FMW:[id number]    DOB: [birth year]    DOA: 01/13/2024  PCP: Feliciano Devoria LABOR, MD   I have briefly reviewed patients previous medical records in [facility].   Brief Hospital Course:  89 year old female, medical history significant for dementia, hypertension, GERD, presented to ED due to progressive lethargy, somnolence, poor oral intake and not getting out of bed, worse from her baseline.  At her baseline, she reportedly ambulates with a walker, conversant, intermittent confusion.  Admitted for acute encephalopathy of unclear etiology.  Extensive workup thus far unremarkable.  Hypothermic on admission which has since resolved.  Mental status changes improved and now getting agitated, resumed meds at reduced doses.   Assessment & Plan:   Acute encephalopathy Etiology unclear.  Extensive evaluation thus far negative.  Suspect due to polypharmacy. CMP, CBC, serial troponin high high-sensitivity, folate, RPR, UA, UDS negative.  CT head without acute findings.  Chest x-ray negative.  VBG without CO2 retention.  Mildly elevated TSH/7.061 but free T4 normal and this is not likely the cause.  Random plasma cortisol 32.6.  B12 level elevated. MRI brain without acute findings.  EEG suggestive of encephalopathy without seizures or epileptiform discharges. Family feels that this may be due to medications which is likely Delirium precautions.   Although hypothermic for several hours on admission, this in itself probably not the cause of her AMS that has been going on for about a week PTA.  Not much improvement after euthermic. Avoid sedatives and opioids. Mental status has significantly improved this morning.  Also getting agitated, refusing meds, screaming and grabbing nurses hand.  Gave low-dose IV Haldol 1 mg x 1 along with IV Benadryl 12.5 mg x 1 (due to some cogwheeling noted yesterday and tremors of right hand this morning).  Then resume home dose of Namenda  10 mg  twice daily, reduced dose of risperidone  1 mg at bedtime and reduce dose of gabapentin  100 mg at bedtime. Discussed with Neurohospitalist on-call on 12/12 regarding starting meds for her rigidity/tremors, they recommended deferring this to nonacute outpatient follow-up. Discussed in detail with patient's daughter on 12/12 and she is wondering if patient will be able to go to a memory care unit.  TOC consulted.  Hypothermia Rectal temperature as low as 95.5 F. Infectious and metabolic workup as above unrevealing for cause Placed on Bair hugger in ED for a couple of hours and hypothermia resolved without recurrence  Hypoglycemia Early this morning, CBGs in the 60s, started on D5 infusion.  Diet resumed.  Monitor closely.  If no further hypoglycemic episodes, and eating well, could consider stopping dextrose infusion.  Essential hypertension Controlled off of meds for now.  As needed antihypertensives (on Imdur and Lasix  PTA).  Not sure of the most recent BP of 147/117 as I curator was during the agitation.  Will need to recheck.  Dementia with behavioral disturbances Suspect that she may have had behavioral disturbances while she is on Risperdal  at home.  Medication management as above.   Body mass index is 26.57 kg/m.   DVT prophylaxis: enoxaparin  (LOVENOX ) injection 40 mg Start: 01/14/24 1000     Code Status: Limited: Do not attempt resuscitation (DNR) -DNR-LIMITED -Do Not Intubate/DNI :  Family Communication: Daughter via phone 12/12.  Daughter was at patient's bedside and reported that she was much better and alert.. Disposition:  Inpatient.  With continued clinical improvement, therapies evaluation, may need SNF placement.   Consultants:   None  Procedures:     Subjective:  When seen this morning, patient significantly better compared to 12/11.  She was alert, when asked her name, stated that it was Theresa Bauer.  She was however confused and did not answer any other questions.   Intermittently smiling.  She was not agitated at that time.  However in a couple of hours, RN messaged to indicate that she was agitated.  Objective:   Vitals:   01/14/24 2143 01/15/24 0044 01/15/24 0500 01/15/24 1025  BP: 121/70 127/69 131/78 (!) 147/117  Pulse: 65 85 79 89  Resp:   18 17  Temp: 98.1 F (36.7 C) 98.1 F (36.7 C) 98.4 F (36.9 C) 98.8 F (37.1 C)  TempSrc: Oral Oral Oral   SpO2: 90% 90% 91% 100%  Weight:      Height:        General exam: Very elderly female, moderately built and nourished lying comfortably propped up in bed without distress.  Looking much better than she did yesterday. Respiratory system: Clear to auscultation. Respiratory effort normal. Cardiovascular system: S1 & S2 heard, RRR. No JVD, murmurs, rubs, gallops or clicks. No pedal edema.  Telemetry personally reviewed: Sinus rhythm/tremor artifacts.  Discontinued telemetry. Gastrointestinal system: Abdomen is nondistended, soft and nontender. No organomegaly or masses felt. Normal bowel sounds heard. Central nervous system: Mental status as noted above. No focal neurological deficits. Extremities: Some tremors of right upper extremity. Skin: No rashes, lesions or ulcers Psychiatry: Judgement and insight impaired. Mood & affect pleasantly confused this morning    Data Reviewed:   I have personally reviewed following labs and imaging studies   CBC: Recent Labs  Lab 01/13/24 1704 01/13/24 1738 01/14/24 0408  WBC  --  5.6 8.1  NEUTROABS  --  2.8  --   HGB 13.9 12.3 12.5  HCT 41.0 38.3 39.0  MCV  --  74.4* 73.4*  PLT  --  151 172    Basic Metabolic Panel: Recent Labs  Lab 01/13/24 1704 01/13/24 1840 01/14/24 0408  NA 141 141 139  K 5.6* 4.2 4.1  CL 105 101 102  CO2  --  28 29  GLUCOSE 105* 109* 116*  BUN 23 15 13   CREATININE 0.80 0.57 0.55  CALCIUM   --  9.3 8.9    Liver Function Tests: Recent Labs  Lab 01/13/24 1840  AST 25  ALT 22  ALKPHOS 98  BILITOT 1.2  PROT 7.7   ALBUMIN 3.2*    CBG: Recent Labs  Lab 01/15/24 0236 01/15/24 0633 01/15/24 0658  GLUCAP 107* 69* 175*    Microbiology Studies:   Recent Results (from the past 240 hours)  Resp panel by RT-PCR (RSV, Flu A&B, Covid) Anterior Nasal Swab     Status: None   Collection Time: 01/13/24  4:47 PM   Specimen: Anterior Nasal Swab  Result Value Ref Range Status   SARS Coronavirus 2 by RT PCR NEGATIVE NEGATIVE Final   Influenza A by PCR NEGATIVE NEGATIVE Final   Influenza B by PCR NEGATIVE NEGATIVE Final    Comment: (NOTE) The Xpert Xpress SARS-CoV-2/FLU/RSV plus assay is intended as an aid in the diagnosis of influenza from Nasopharyngeal swab specimens and should not be used as a sole basis for treatment. Nasal washings and aspirates are unacceptable for Xpert Xpress SARS-CoV-2/FLU/RSV testing.  Fact Sheet for Patients: bloggercourse.com  Fact Sheet for Healthcare Providers: seriousbroker.it  This test is not yet approved or cleared by the [place]  FDA and has been  authorized for detection and/or diagnosis of SARS-CoV-2 by FDA under an Emergency Use Authorization (EUA). This EUA will remain in effect (meaning this test can be used) for the duration of the COVID-19 declaration under Section 564(b)(1) of the Act, 21 U.S.C. section 360bbb-3(b)(1), unless the authorization is terminated or revoked.     Resp Syncytial Virus by PCR NEGATIVE NEGATIVE Final    Comment: (NOTE) Fact Sheet for Patients: bloggercourse.com  Fact Sheet for Healthcare Providers: seriousbroker.it  This test is not yet approved or cleared by the [place]  FDA and has been authorized for detection and/or diagnosis of SARS-CoV-2 by FDA under an Emergency Use Authorization (EUA). This EUA will remain in effect (meaning this test can be used) for the duration of the COVID-19 declaration under Section  564(b)(1) of the Act, 21 U.S.C. section 360bbb-3(b)(1), unless the authorization is terminated or revoked.  Performed at [facility], [address], [place], [place] [address]     Radiology Studies:  EEG adult Result Date: Jan 22, 2024 Shelton Arlin KIDD, MD     2024-01-22  5:20 PM Patient Name: Theresa Bauer MRN: [id number] Epilepsy Attending: Arlin KIDD Shelton Referring Physician/Provider: Charlton Evalene RAMAN, MD Date: 01/22/2024 Duration: 34.05 mins Patient history: 89yo F with ams, EEG to evaluate for seizure Level of alertness: Awake/ lethargic AEDs during EEG study: None Technical aspects: This EEG study was done with scalp electrodes positioned according to the 10-20 International system of electrode placement. Electrical activity was reviewed with band pass filter of 1-70Hz , sensitivity of 7 uV/mm, display speed of 27mm/sec with a 60Hz  notched filter applied as appropriate. EEG data were recorded continuously and digitally stored.  Video monitoring was available and reviewed as appropriate. Description:  EEG showed continuous generalized predominantly 5 to 6 Hz theta slowing admixed with intermittent 2-3hz  delta slowing. Hyperventilation and photic stimulation were not performed.    ABNORMALITY - Continuous slow, generalized  IMPRESSION: This study is suggestive of generalized cerebral dysfunction ( encephalopathy). No seizures or epileptiform discharges were seen throughout the recording.  Arlin KIDD Shelton   MR BRAIN WO CONTRAST Result Date: 01-22-2024 EXAM: MR Brain without Intravenous Contrast. CLINICAL HISTORY: 89 year old female. Mental status change, unknown cause. TECHNIQUE: Magnetic resonance images of the brain without intravenous contrast in multiple planes. CONTRAST: Without. COMPARISON: Head CT 01/13/2024 and earlier. FINDINGS: BRAIN: No restricted diffusion to indicate acute infarction. No intracranial mass or hemorrhage. No midline shift or extra-axial fluid collection. No  cerebellar tonsillar ectopia. The central arterial and venous flow voids are patent. Brain volume has not significantly changed from last year. Patchy and confluent bilateral cerebral white matter T2 and FLAIR hyperintensity. Similar moderate T2 heterogeneity throughout the bilateral deep gray nuclei. Brainstem and cerebellum are relatively spared. Minimal chronic cerebral blood products; fewer than 6 chronic microhemorrhages identified scattered in the cerebral hemispheres. VENTRICLES: No hydrocephalus. ORBITS: The orbits are normal. SINUSES AND MASTOIDS: Chronic paranasal sinus disease with mucoperiosteal thickening, predominantly on the right side appears stable recently but progressed since 2024. The mastoid air cells are well aerated. Grossly normal visible internal auditory structures. BONES: No acute fracture or focal osseous lesion. CERVICAL SPINE: Negative for age visible cervical spine. IMPRESSION: 1. No acute intracranial abnormality. 2. Moderate-for-age signal changes in the deep gray nuclei and cerebral white matter, with a small number of chronic microhemorrhages, compatible with chronic small vessel disease. 3. Advanced chronic paranasal sinus disease, progressed since last year. Electronically signed by: Helayne Hurst MD 2024-01-22 12:22 PM EST RP Workstation: [id number]  CT Head Wo Contrast Result Date: 01/13/2024 CLINICAL DATA:  Altered level of consciousness EXAM: CT HEAD WITHOUT CONTRAST TECHNIQUE: Contiguous axial images were obtained from the base of the skull through the vertex without intravenous contrast. RADIATION DOSE REDUCTION: This exam was performed according to the departmental dose-optimization program which includes automated exposure control, adjustment of the mA and/or kV according to patient size and/or use of iterative reconstruction technique. COMPARISON:  12/03/2022 FINDINGS: Brain: No acute infarct or hemorrhage. The lateral ventricles and midline structures are stable.  Stable hypodensities in the periventricular white matter and bilateral basal ganglia consistent with chronic small vessel ischemic changes. No acute extra-axial fluid collections. No mass effect. Vascular: No hyperdense vessel or unexpected calcification. Skull: Normal. Negative for fracture or focal lesion. Sinuses/Orbits: Complete opacification of the right frontal, ethmoid, maxillary, and sphenoid sinuses. Mild mucosal thickening in the left sphenoid sinus. Other: None. IMPRESSION: 1. No acute intracranial process. 2. Predominantly right-sided paranasal sinus disease as above. Electronically Signed   By: Ozell Daring M.D.   On: 01/13/2024 18:13   DG Chest 1 View Result Date: 01/13/2024 EXAM: 1 VIEW(S) XRAY OF THE CHEST 01/13/2024 05:26:16 PM COMPARISON: 07/27/2022 CLINICAL HISTORY: ams, cough FINDINGS: LUNGS AND PLEURA: No focal pulmonary opacity. No pleural effusion. No pneumothorax. HEART AND MEDIASTINUM: Mild cardiomegaly. No acute abnormality of the mediastinal silhouette. BONES AND SOFT TISSUES: No acute osseous abnormality. IMPRESSION: 1. No acute cardiopulmonary process identified. Electronically signed by: Lynwood Seip MD 01/13/2024 05:44 PM EST RP Workstation: [id number]    Scheduled Meds:    dorzolamide -timolol   1 drop Both Eyes BID   enoxaparin  (LOVENOX ) injection  40 mg Subcutaneous Q24H   gabapentin   100 mg Oral QHS   latanoprost   1 drop Both Eyes QHS   memantine   10 mg Oral BID   risperiDONE   1 mg Oral QHS   sodium chloride  flush  3 mL Intravenous Q12H    Continuous Infusions:    dextrose 5 % and 0.9 % NaCl 75 mL/hr at 01/15/24 0656     LOS: 1 day     Trenda Mar, MD,  FACP, [facility], [facility], [facility]   [facility] [place]      To contact the attending provider between 7A-7P or the covering provider during after hours 7P-7A, please log into the web site [email or web address] and access using universal [place] password for that web  site. If you do not have the password, please call the hospital operator.  01/15/2024, 12:00 PM

## 2024-01-16 LAB — GLUCOSE, CAPILLARY
Glucose-Capillary: 53 mg/dL — ABNORMAL LOW (ref 70–99)
Glucose-Capillary: 104 mg/dL — ABNORMAL HIGH (ref 70–99)
Glucose-Capillary: 113 mg/dL — ABNORMAL HIGH (ref 70–99)
Glucose-Capillary: 75 mg/dL (ref 70–99)
Glucose-Capillary: 96 mg/dL (ref 70–99)
Glucose-Capillary: 91 mg/dL (ref 70–99)
Glucose-Capillary: 96 mg/dL (ref 70–99)

## 2024-01-16 MED ORDER — GLUCAGON HCL RDNA (DIAGNOSTIC) 1 MG IJ SOLR
INTRAMUSCULAR | Status: AC
  Administered 2024-01-16: 1 mg via INTRAMUSCULAR
  Filled 2024-01-16: qty 1

## 2024-01-16 MED ORDER — GLUCAGON HCL RDNA (DIAGNOSTIC) 1 MG IJ SOLR: 1.0000 mg | INTRAMUSCULAR | Status: AC

## 2024-01-16 MED ORDER — ISOSORBIDE MONONITRATE 10 MG PO TABS
10.0000 mg | ORAL_TABLET | Freq: Two times a day (BID) | ORAL | Status: DC
  Administered 2024-01-16 – 2024-01-17 (×4): 10 mg via ORAL
  Filled 2024-01-16 (×7): qty 1

## 2024-01-16 NOTE — Progress Notes (Signed)
 PROGRESS NOTE    Theresa Bauer  FMW:[id number]  DOB: Jul 01, 1929  DOA: 01/13/2024 PCP: Theresa Bauer LABOR, MD Outpatient Specialists:   Hospital course:  89 year old female, medical history significant for dementia, hypertension, GERD, presented to ED due to progressive lethargy, somnolence, poor oral intake and not getting out of bed, worse from her baseline.  At her baseline, she reportedly ambulates with a walker, conversant, intermittent confusion.  Admitted for acute encephalopathy of unclear etiology.  Extensive workup thus far unremarkable.  Hypothermic on admission which has since resolved.  Mental status changes improved and now getting agitated, resumed meds at reduced doses.  12/10: New hypoglycemia requiring D5 drip, infection workup, head CT/MRI negative 12/13: Patient ate breakfast with proved blood sugars, continue D5 drip  Subjective:  Patient herself is without any complaints.  Seen with her daughter at bedside who notes that she ate a good breakfast.  Discussed hypoglycemia and implications noting that infectious workup including chest x-ray, UA, CBC as well as head CT and MRI are negative.  Noted that inability to maintain blood sugar and body temperature at extremes of age may be associated with end-of-life.  Patient's daughter states she understands but would like to keep her on D5 for now and like to see if she could start eating again with resumption of her mental status.  Objective: Vitals:   01/15/24 1025 01/15/24 1822 01/15/24 2139 01/16/24 0505  BP: (!) 147/117 (!) 192/83 (!) 177/61 (!) 161/78  Pulse: 89 81 69 64  Resp: 17 16 16    Temp: 98.8 F (37.1 C) 98.4 F (36.9 C) 98.4 F (36.9 C) 98 F (36.7 C)  TempSrc:  Oral Oral Oral  SpO2: 100% 100% 100% 97%  Weight:      Height:        Intake/Output Summary (Last 24 hours) at 01/16/2024 1002 Last data filed at 01/16/2024 0506 Gross per 24 hour  Intake 972.44 ml  Output 550 ml  Net 422.44 ml   Filed  Weights   01/13/24 1602  Weight: 61.7 kg     Exam:  General: Patient lying in bed looking confused with attentive daughter at bedside, patient does not answer any of my questions Eyes: sclera anicteric, conjuctiva mild injection bilaterally CVS: S1-S2, regular  Respiratory:  decreased air entry bilaterally secondary to decreased inspiratory effort, rales at bases  GI: NABS, soft, NT  LE: Warm and well-perfused  Data Reviewed:  Basic Metabolic Panel: Recent Labs  Lab 01/13/24 1704 01/13/24 1840 01/14/24 0408  NA 141 141 139  K 5.6* 4.2 4.1  CL 105 101 102  CO2  --  28 29  GLUCOSE 105* 109* 116*  BUN 23 15 13   CREATININE 0.80 0.57 0.55  CALCIUM   --  9.3 8.9    CBC: Recent Labs  Lab 01/13/24 1704 01/13/24 1738 01/14/24 0408  WBC  --  5.6 8.1  NEUTROABS  --  2.8  --   HGB 13.9 12.3 12.5  HCT 41.0 38.3 39.0  MCV  --  74.4* 73.4*  PLT  --  151 172     Scheduled Meds:  dorzolamide -timolol   1 drop Both Eyes BID   enoxaparin  (LOVENOX ) injection  40 mg Subcutaneous Q24H   gabapentin   100 mg Oral QHS   latanoprost   1 drop Both Eyes QHS   risperiDONE   1 mg Oral QHS   sodium chloride  flush  3 mL Intravenous Q12H   Continuous Infusions:   Assessment & Plan:   Hypoglycemia  Goals of care discussion Discussed with daughter that this may be secondary to extremes of age/end-of-life and that patient is not eating very much.  Daughter notes that she thinks she is on too many medications and is sleeping too much which is why she is not eating.  She thinks the Namenda  has made her mother more sleepy. Infectious workup including head imaging was negative on 12/10 Patient ate breakfast this morning with improved blood sugar Patient remains on D5 drip Discussed end-of-life issues with patient's daughter, she states she will consider Will discontinue Namenda  Continue low-dose risperidone  Patient remains DNR  HTN Blood pressure has rebounded with fluid resuscitation I  wonder if some of her symptoms may have been from dehydration, will not restart Lasix  Restart Imdur  which had been held due to lower blood pressures  Hypothermia Present on admission, treated with Bair hugger with normalization of body temperature Unclear etiology, infectious and metabolic workup are negative  Metabolic encephalopathy-extensive workup negative, Baseline dementia with behavioral disturbances Thought to be secondary to medications on admission, now improved  Patient had been on Lasix  for HTN treatment, patient is improved with hydration, depressed mental status may have been secondary to slow progressive dehydration/intravascular volume depletion. Will discontinue Lasix , would not restart on discharge especially given decreased p.o. intake Extensive workup was negative:CMP, CBC, serial troponin high high-sensitivity, folate, RPR, UA, UDS negative. CT head without acute findings. Chest x-ray negative. VBG without CO2 retention. Mildly elevated TSH/7.061 but free T4 normal and this is not likely the cause. Random plasma cortisol 32.6. B12 level elevated.    DVT prophylaxis: Lovenox  Code Status: DNR Family Communication: Daughter was at bedside throughout     Studies: EEG adult Result Date: 01/14/2024 Theresa Theresa KIDD, MD     01/14/2024  5:20 PM Patient Name: Theresa Bauer MRN: [id number] Epilepsy Attending: Arlin Bauer Theresa Referring Physician/Provider: Charlton Theresa RAMAN, MD Date: 01/14/2024 Duration: 34.05 mins Patient history: 89yo F with ams, EEG to evaluate for seizure Level of alertness: Awake/ lethargic AEDs during EEG study: None Technical aspects: This EEG study was done with scalp electrodes positioned according to the 10-20 International system of electrode placement. Electrical activity was reviewed with band pass filter of 1-70Hz , sensitivity of 7 uV/mm, display speed of 32mm/sec with a 60Hz  notched filter applied as appropriate. EEG data were recorded continuously and  digitally stored.  Video monitoring was available and reviewed as appropriate. Description:  EEG showed continuous generalized predominantly 5 to 6 Hz theta slowing admixed with intermittent 2-3hz  delta slowing. Hyperventilation and photic stimulation were not performed.    ABNORMALITY - Continuous slow, generalized  IMPRESSION: This study is suggestive of generalized cerebral dysfunction ( encephalopathy). No seizures or epileptiform discharges were seen throughout the recording.  Theresa Bauer Theresa   MR BRAIN WO CONTRAST Result Date: 01/14/2024 EXAM: MR Brain without Intravenous Contrast. CLINICAL HISTORY: 89 year old female. Mental status change, unknown cause. TECHNIQUE: Magnetic resonance images of the brain without intravenous contrast in multiple planes. CONTRAST: Without. COMPARISON: Head CT 01/13/2024 and earlier. FINDINGS: BRAIN: No restricted diffusion to indicate acute infarction. No intracranial mass or hemorrhage. No midline shift or extra-axial fluid collection. No cerebellar tonsillar ectopia. The central arterial and venous flow voids are patent. Brain volume has not significantly changed from last year. Patchy and confluent bilateral cerebral white matter T2 and FLAIR hyperintensity. Similar moderate T2 heterogeneity throughout the bilateral deep gray nuclei. Brainstem and cerebellum are relatively spared. Minimal chronic cerebral blood products; fewer than 6 chronic microhemorrhages identified  scattered in the cerebral hemispheres. VENTRICLES: No hydrocephalus. ORBITS: The orbits are normal. SINUSES AND MASTOIDS: Chronic paranasal sinus disease with mucoperiosteal thickening, predominantly on the right side appears stable recently but progressed since 2024. The mastoid air cells are well aerated. Grossly normal visible internal auditory structures. BONES: No acute fracture or focal osseous lesion. CERVICAL SPINE: Negative for age visible cervical spine. IMPRESSION: 1. No acute intracranial  abnormality. 2. Moderate-for-age signal changes in the deep gray nuclei and cerebral white matter, with a small number of chronic microhemorrhages, compatible with chronic small vessel disease. 3. Advanced chronic paranasal sinus disease, progressed since last year. Electronically signed by: Helayne Hurst MD 01/14/2024 12:22 PM EST RP Workstation: [id number]    Principal Problem:   Acute encephalopathy Active Problems:   Essential hypertension   Dementia (HCC)     Theresa Bauer, [facility]  If 7PM-7AM, please contact night-coverage [email or web address]   LOS: 2 days

## 2024-01-16 NOTE — Evaluation (Signed)
 Physical Therapy Evaluation Patient Details Name: Theresa Bauer MRN: [id number] DOB: Aug 09, 1929 Today's Date: 01/16/2024  History of Present Illness  Pt is a 89 y.o. female who presented 01/13/24 due to lethargy, somnolence, poor oral intake and not getting out of bed. Pt admitted due to Acute encephalopathy with unclear etiology.  PMH: dementia, HTN, GERD,  Clinical Impression  Pt admitted with above diagnosis. Limited historian, family not present during assessment. Reportedly from [place]. Requires Mod-max assist +2 for bed mobility and transfers, progressing with lateral steps along bed. Significant difficulty sequencing, and shows heavy posterior lean. Episode of stool incontinence. Stood for several minutes with peri-care assist from OT. Patient will benefit from continued inpatient follow up therapy, <3 hours/day.  Pt currently with functional limitations due to the deficits listed below (see PT Problem List). Pt will benefit from acute skilled PT to increase their independence and safety with mobility to allow discharge.           If plan is discharge home, recommend the following: Two people to help with walking and/or transfers;A lot of help with bathing/dressing/bathroom;Assistance with cooking/housework;Direct supervision/assist for medications management;Direct supervision/assist for financial management;Assistance with feeding;Assist for transportation;Help with stairs or ramp for entrance;Supervision due to cognitive status   Can travel by private vehicle        Equipment Recommendations Wheelchair (measurements PT);Wheelchair cushion (measurements PT);Hospital bed;Hoyer lift  Recommendations for Other Services       Functional Status Assessment Patient has had a recent decline in their functional status and demonstrates the ability to make significant improvements in function in a reasonable and predictable amount of time.     Precautions / Restrictions  Precautions Precautions: Fall Recall of Precautions/Restrictions: Impaired Restrictions Weight Bearing Restrictions Per Provider Order: No      Mobility  Bed Mobility Overal bed mobility: Needs Assistance Bed Mobility: Supine to Sit, Sit to Supine     Supine to sit: Mod assist, +2 for physical assistance, +2 for safety/equipment, HOB elevated, Used rails Sit to supine: Mod assist, +2 for physical assistance, +2 for safety/equipment, HOB elevated, Used rails   General bed mobility comments: Mod assist +2 to roll and rise, with guidance for reaching and pulling through rail to assist. Fairly rigid. total x2 for reposition in bed    Transfers Overall transfer level: Needs assistance Equipment used: Rolling walker (2 wheels) Transfers: Sit to/from Stand Sit to Stand: Max assist, +2 physical assistance, +2 safety/equipment           General transfer comment: Max assist to rise to EOB, perfored x2 with +2 support to steady and boost. Leaning posteriorly heavily. Max cues for technique.    Ambulation/Gait Ambulation/Gait assistance: Max assist, +2 safety/equipment Gait Distance (Feet): 3 Feet Assistive device: Rolling walker (2 wheels) Gait Pattern/deviations: Step-to pattern, Narrow base of support, Leaning posteriorly     Pre-gait activities: Weight shift, static steps with up to max assist. General Gait Details: Lateral steps along bed with max assist +2 for weight shift, RW control and to sequence steps (RLE tends to adduct when she lifts it from the floor.) Unable to safely step forward yet.  Stairs            Wheelchair Mobility     Tilt Bed    Modified Rankin (Stroke Patients Only)       Balance Overall balance assessment: Needs assistance Sitting-balance support: Feet supported, Bilateral upper extremity supported, No upper extremity supported Sitting balance-Leahy Scale: Fair Sitting balance - Comments:  intially needed min-mod assist but then  progressed to CGA while sitting with ADLs   Standing balance support: Bilateral upper extremity supported, Reliant on assistive device for balance Standing balance-Leahy Scale: Poor Standing balance comment: Mod-max assist for balance. leaning posteriorly.                             Pertinent Vitals/Pain Pain Assessment Pain Assessment: Faces Faces Pain Scale: Hurts little more Pain Location: BLE with repositioning Pain Descriptors / Indicators: Grimacing Pain Intervention(s): Limited activity within patient's tolerance, Monitored during session, Repositioned    Home Living Family/patient expects to be discharged to:: Skilled nursing facility                   Additional Comments: Pt could not report recent level of function but per chart was ambulating and from [place].    Prior Function Prior Level of Function :  (Per chart was ambulating but unclear on level of assist)             Mobility Comments: Reports ambulating with RW       Extremity/Trunk Assessment   Upper Extremity Assessment Upper Extremity Assessment: Defer to OT evaluation    Lower Extremity Assessment Lower Extremity Assessment: Generalized weakness;Difficult to assess due to impaired cognition (Seems to have limited knee flexion BIL.)    Cervical / Trunk Assessment Cervical / Trunk Assessment: Kyphotic  Communication   Communication Communication: Impaired Factors Affecting Communication: Reduced clarity of speech    Cognition Arousal: Lethargic Behavior During Therapy: Flat affect   PT - Cognitive impairments: No family/caregiver present to determine baseline, Awareness, Memory, Attention, Initiation, Sequencing, Problem solving, Safety/Judgement                         Following commands: Impaired Following commands impaired: Follows one step commands inconsistently, Follows one step commands with increased time     Cueing Cueing Techniques: Verbal cues      General Comments General comments (skin integrity, edema, etc.): Incontinent of stool. Tolerated standing balance with mod - max assist for several minutes while OT assisted with peri-care.    Exercises     Assessment/Plan    PT Assessment Patient needs continued PT services  PT Problem List Decreased strength;Decreased range of motion;Decreased activity tolerance;Decreased balance;Decreased mobility;Decreased cognition;Decreased coordination;Decreased knowledge of use of DME;Decreased safety awareness;Decreased knowledge of precautions       PT Treatment Interventions DME instruction;Gait training;Functional mobility training;Therapeutic activities;Therapeutic exercise;Balance training;Neuromuscular re-education;Cognitive remediation;Patient/family education;Wheelchair mobility training;Manual techniques;Modalities    PT Goals (Current goals can be found in the Care Plan section)  Acute Rehab PT Goals Patient Stated Goal: none stated PT Goal Formulation: Patient unable to participate in goal setting Time For Goal Achievement: 01/30/24 Potential to Achieve Goals: Fair    Frequency Min 1X/week     Co-evaluation PT/OT/SLP Co-Evaluation/Treatment: Yes Reason for Co-Treatment: Necessary to address cognition/behavior during functional activity;For patient/therapist safety;To address functional/ADL transfers PT goals addressed during session: Mobility/safety with mobility;Balance;Proper use of DME         AM-PAC PT 6 Clicks Mobility  Outcome Measure Help needed turning from your back to your side while in a flat bed without using bedrails?: Total Help needed moving from lying on your back to sitting on the side of a flat bed without using bedrails?: Total Help needed moving to and from a bed to a chair (including a wheelchair)?: Total Help  needed standing up from a chair using your arms (e.g., wheelchair or bedside chair)?: Total Help needed to walk in hospital room?: Total Help  needed climbing 3-5 steps with a railing? : Total 6 Click Score: 6    End of Session Equipment Utilized During Treatment: Gait belt Activity Tolerance: Other (comment) (Limited by cognition, weakness.) Patient left: in bed;with call bell/phone within reach;with bed alarm set (modified chair position) Nurse Communication: Mobility status PT Visit Diagnosis: Unsteadiness on feet (R26.81);Other abnormalities of gait and mobility (R26.89);Muscle weakness (generalized) (M62.81);Difficulty in walking, not elsewhere classified (R26.2);Other symptoms and signs involving the nervous system (R29.898)    Time: 8883-8852 PT Time Calculation (min) (ACUTE ONLY): 31 min   Charges:   PT Evaluation $PT Eval Moderate Complexity: 1 Mod   PT General Charges $$ ACUTE PT VISIT: 1 Visit         Leontine Roads, PT, DPT [facility]  Rehabilitation Services Physical Therapist Office: [phone] Website: [email or web address]   Leontine GORMAN Roads 01/16/2024, 1:22 PM

## 2024-01-16 NOTE — Plan of Care (Signed)
   Problem: Education: Goal: Knowledge of General Education information will improve Description Including pain rating scale, medication(s)/side effects and non-pharmacologic comfort measures Outcome: Progressing

## 2024-01-16 NOTE — Evaluation (Signed)
 Occupational Therapy Evaluation Patient Details Name: Theresa Bauer MRN: [id number] DOB: 03/23/1929 Today's Date: 01/16/2024   History of Present Illness   Pt is a 89 y.o. female who presented 01/13/24 due to lethargy, somnolence, poor oral intake and not getting out of bed. Pt admitted due to Acute encephalopathy with unclear etiology.  PMH: dementia, HTN, GERD,     Clinical Impressions Pt presented in bed with no family present. Pt was very lethargic at the start of session and needed increase in time to engage into tasks. She was able to complete bed mobility with mod x2 to sit at EOB with min-CGA, then completed sit to stand with max x2 then needed total assist for peri care as had a BM while in standing. She then was able to take lateral steps with max x2 with PT moving BLE. Patient will benefit from continued inpatient follow up therapy, <3 hours/day.      If plan is discharge home, recommend the following:   Two people to help with walking and/or transfers;Two people to help with bathing/dressing/bathroom;Assistance with cooking/housework;Assistance with feeding;Direct supervision/assist for medications management;Direct supervision/assist for financial management;Assist for transportation;Help with stairs or ramp for entrance;Supervision due to cognitive status     Functional Status Assessment   Patient has had a recent decline in their functional status and demonstrates the ability to make significant improvements in function in a reasonable and predictable amount of time.     Equipment Recommendations    (TBD)     Recommendations for Other Services         Precautions/Restrictions   Precautions Precautions: Fall Recall of Precautions/Restrictions: Impaired Restrictions Weight Bearing Restrictions Per Provider Order: No     Mobility Bed Mobility Overal bed mobility: Needs Assistance Bed Mobility: Supine to Sit, Sit to Supine     Supine to sit: Mod  assist, +2 for physical assistance, +2 for safety/equipment, HOB elevated, Used rails Sit to supine: Mod assist, +2 for physical assistance, +2 for safety/equipment, HOB elevated, Used rails   General bed mobility comments: total x2 for reposition in bed    Transfers Overall transfer level: Needs assistance Equipment used: Rolling walker (2 wheels) Transfers: Sit to/from Stand Sit to Stand: Max assist, +2 physical assistance, +2 safety/equipment           General transfer comment: while steadying RW      Balance Overall balance assessment: Needs assistance Sitting-balance support: Feet supported, Bilateral upper extremity supported, No upper extremity supported Sitting balance-Leahy Scale: Fair Sitting balance - Comments: intially needed min-mod assist but then progressed to CGA while sitting with ADLs   Standing balance support: Bilateral upper extremity supported, Reliant on assistive device for balance Standing balance-Leahy Scale: Poor                             ADL either performed or assessed with clinical judgement   ADL Overall ADL's : Needs assistance/impaired   Eating/Feeding Details (indicate cue type and reason): pt had food but due to level of fatigue did not complete Grooming: Wash/dry face;Minimal assistance;Sitting;Moderate assistance Grooming Details (indicate cue type and reason): pt perseverated in one spot at a time Upper Body Bathing: Maximal assistance;Sitting   Lower Body Bathing: Total assistance;+2 for physical assistance;+2 for safety/equipment;Sit to/from stand   Upper Body Dressing : Maximal assistance;Sitting   Lower Body Dressing: Total assistance;+2 for physical assistance;+2 for safety/equipment;Sit to/from stand       Toileting- [employer] Streamer  Manipulation and Hygiene: Total assistance;+2 for physical assistance;+2 for safety/equipment;Sit to/from stand Toileting - Clothing Manipulation Details (indicate cue type and reason): 1  person completion of peri care while the other assists with standing     Functional mobility during ADLs: Total assistance;Maximal assistance;+2 for physical assistance;+2 for safety/equipment;Rolling walker (2 wheels) General ADL Comments: side stepping to [facility]     Vision Baseline Vision/History: 1 Wears glasses       Perception         Praxis         Pertinent Vitals/Pain Pain Assessment Pain Assessment: Faces Faces Pain Scale: Hurts little more Pain Location: BLE with repositioning Pain Descriptors / Indicators: Grimacing Pain Intervention(s): Limited activity within patient's tolerance, Monitored during session, Repositioned     Extremity/Trunk Assessment Upper Extremity Assessment Upper Extremity Assessment: Generalized weakness;Difficult to assess due to impaired cognition       Cervical / Trunk Assessment Cervical / Trunk Assessment: Kyphotic   Communication Communication Communication: Impaired Factors Affecting Communication: Reduced clarity of speech   Cognition Arousal: Lethargic Behavior During Therapy: Flat affect Cognition: History of cognitive impairments, No family/caregiver present to determine baseline             OT - Cognition Comments: Pt needs increase in time, difficult following one step commands                 Following commands: Impaired Following commands impaired: Follows one step commands inconsistently     Cueing  General Comments   Cueing Techniques: Verbal cues      Exercises     Shoulder Instructions      Home Living Family/patient expects to be discharged to:: Skilled nursing facility                                 Additional Comments: Pt could not report recent level of function but per chart was ambulating and from [place].      Prior Functioning/Environment Prior Level of Function :  (Per chart was ambulating but unclear on level of assist)                    OT Problem List:  Decreased strength;Decreased activity tolerance;Impaired balance (sitting and/or standing);Decreased safety awareness;Decreased knowledge of use of DME or AE;Pain   OT Treatment/Interventions: Self-care/ADL training;Therapeutic exercise;DME and/or AE instruction;Therapeutic activities;Patient/family education;Balance training      OT Goals(Current goals can be found in the care plan section)   Acute Rehab OT Goals Patient Stated Goal: none OT Goal Formulation: Patient unable to participate in goal setting Time For Goal Achievement: 01/30/24 Potential to Achieve Goals: Fair   OT Frequency:  Min 2X/week    Co-evaluation PT/OT/SLP Co-Evaluation/Treatment: Yes Reason for Co-Treatment: Necessary to address cognition/behavior during functional activity;For patient/therapist safety;To address functional/ADL transfers PT goals addressed during session: Mobility/safety with mobility;Balance;Proper use of DME        AM-PAC OT 6 Clicks Daily Activity     Outcome Measure Help from another person eating meals?: A Lot Help from another person taking care of personal grooming?: A Lot Help from another person toileting, which includes using toliet, bedpan, or urinal?: Total Help from another person bathing (including washing, rinsing, drying)?: A Lot Help from another person to put on and taking off regular upper body clothing?: A Lot Help from another person to put on and taking off regular lower body clothing?: Total 6 Click  Score: 10   End of Session Equipment Utilized During Treatment: Gait belt;Rolling walker (2 wheels) Nurse Communication: Mobility status (bm)  Activity Tolerance: Patient tolerated treatment well Patient left: in bed;with call bell/phone within reach;with bed alarm set  OT Visit Diagnosis: Unsteadiness on feet (R26.81);Other abnormalities of gait and mobility (R26.89);Muscle weakness (generalized) (M62.81);Pain Pain - part of body:  (BLE)                Time:  8884-8852 OT Time Calculation (min): 32 min Charges:  OT General Charges $OT Visit: 1 Visit OT Evaluation $OT Eval Low Complexity: 1 Low  Warrick POUR OTR/L  Acute Rehab Services  [phone] office number   Warrick Berber 01/16/2024, 12:58 PM

## 2024-01-16 NOTE — NC FL2 (Signed)
 [facility]  MEDICAID FL2 LEVEL OF CARE FORM     IDENTIFICATION  Patient Name: Theresa Bauer Birthdate: 1930-01-21 Sex: female Admission Date (Current Location): 01/13/2024  [place] and [place] Number:  [place] [id number] Facility and Address:  [facility] [place], [address], [place], [place] [address]      Provider Number: [id number]  Attending Physician Name and Address:  Dana Ivan Masse*  Relative Name and Phone Number:  Greenstein,Blanche Daughter [phone]    Current Level of Care: Hospital Recommended Level of Care: Skilled Nursing Facility Prior Approval Number:    Date Approved/Denied:   PASRR Number: [id number]  Discharge Plan: SNF    Current Diagnoses: Patient Active Problem List   Diagnosis Date Noted   Acute encephalopathy 01/13/2024   Dementia (HCC)    Status post right cataract extraction 04/22/2017   Primary open angle glaucoma (POAG) of left eye, severe stage 03/03/2017   Hallucination, visual 02/14/2017   Acute psychosis (HCC) 02/14/2017   Low back pain 11/08/2016   Combined forms of age-related cataract of right eye 05/01/2016   Osteoporosis 09/30/2012   Branch retinal artery occlusion, left eye 09/30/2012   Essential hypertension 04/05/2010   Venous (peripheral) insufficiency 04/05/2010   GERD 04/05/2010   CHEST PAIN 04/05/2010    Orientation RESPIRATION BLADDER Height & Weight     Self  Normal (see discharge summary) Incontinent Weight: 136 lb 0.4 oz (61.7 kg) Height:  5' (152.4 cm)  BEHAVIORAL SYMPTOMS/MOOD NEUROLOGICAL BOWEL NUTRITION STATUS      Incontinent Diet (see discharge summary)  AMBULATORY STATUS COMMUNICATION OF NEEDS Skin   Extensive Assist Verbally Normal                       Personal Care Assistance Level of Assistance  Bathing, Feeding, Dressing, Total care Bathing Assistance: Maximum assistance Feeding assistance: Limited assistance Dressing Assistance: Maximum assistance Total Care  Assistance: Maximum assistance   Functional Limitations Info             SPECIAL CARE FACTORS FREQUENCY  PT (By licensed PT), OT (By licensed OT)     PT Frequency: 5x week OT Frequency: 5x week            Contractures Contractures Info: Not present    Additional Factors Info  Code Status, Allergies Code Status Info: DNR Allergies Info: Lisinopril, Darvocet (Propoxyphene N-acetaminophen ), Penicillins, Aspirin, Brimonidine, Darvon (Propoxyphene)           Current Medications (01/16/2024):  This is the current hospital active medication list Current Facility-Administered Medications  Medication Dose Route Frequency Provider Last Rate Last Admin   acetaminophen  (TYLENOL ) tablet 650 mg  650 mg Oral Q6H PRN Opyd, Timothy S, MD       Or   acetaminophen  (TYLENOL ) suppository 650 mg  650 mg Rectal Q6H PRN Opyd, Timothy S, MD       dorzolamide -timolol  (COSOPT ) 2-0.5 % ophthalmic solution 1 drop  1 drop Both Eyes BID Hongalgi, Anand D, MD   1 drop at 01/16/24 9040   enoxaparin  (LOVENOX ) injection 40 mg  40 mg Subcutaneous Q24H Opyd, Timothy S, MD   40 mg at 01/16/24 1000   gabapentin  (NEURONTIN ) capsule 100 mg  100 mg Oral QHS Hongalgi, Anand D, MD       isosorbide  mononitrate (ISMO ) tablet 10 mg  10 mg Oral BID Chatterjee, Srobona Tublu, MD       latanoprost  (XALATAN ) 0.005 % ophthalmic solution 1 drop  1 drop  Both Eyes QHS Judeth Trenda BIRCH, MD   1 drop at 01/15/24 2357   ondansetron  (ZOFRAN ) tablet 4 mg  4 mg Oral Q6H PRN Opyd, Timothy S, MD       Or   ondansetron  (ZOFRAN ) injection 4 mg  4 mg Intravenous Q6H PRN Opyd, Timothy S, MD       risperiDONE  (RISPERDAL ) tablet 1 mg  1 mg Oral QHS Hongalgi, Anand D, MD       sodium chloride  flush (NS) 0.9 % injection 3 mL  3 mL Intravenous Q12H Opyd, Timothy S, MD   3 mL at 01/16/24 1000     Discharge Medications: Please see discharge summary for a list of discharge medications.  Relevant Imaging Results:  Relevant Lab  Results:   Additional Information SSN  [id number] [place], [place]

## 2024-01-16 NOTE — TOC Initial Note (Signed)
 Transition of Care [facility]) - Initial/Assessment Note    Patient Details  Name: Theresa Bauer MRN: [id number] Date of Birth: 25-Feb-1929  [facility]) CM/SW Contact:    Bridget Cordella [place], LCSW Phone Number: 01/16/2024, 3:49 PM  Clinical Narrative:      Pt oriented x0-1, only minimally responded when CSW attempted to engage her in conversation, does state her name.  CSW spoke with daughter Leonie by phone.  She confirms pt is form [facility] and she does intend for pt to return.  Discussed PT recommending PT at [facility] and she does want to pursue this.  She also had questions about outpt palliative services, which was discussed and she will think about.  Referral sent to [facility].             Expected Discharge Plan: Skilled Nursing Facility Barriers to Discharge: Continued Medical Work up, SNF Pending bed offer   Patient Goals and CMS Choice     Choice offered to / list presented to : Adult Children (daughter Leonie)      Expected Discharge Plan and Services In-house Referral: Clinical Social Work   Post Acute Care Choice: Skilled Nursing Facility Living arrangements for the past 2 months: Skilled Nursing Facility                                      Prior Living Arrangements/Services Living arrangements for the past 2 months: Skilled Nursing Facility Lives with:: Facility Resident Patient language and need for interpreter reviewed:: Yes        Need for Family Participation in Patient Care: Yes (Comment) Care giver support system in place?: Yes (comment) Current home services: Other (comment) (na) Criminal Activity/Legal Involvement Pertinent to Current Situation/Hospitalization: No - Comment as needed  Activities of Daily Living      Permission Sought/Granted                  Emotional Assessment Appearance:: Appears stated age Attitude/Demeanor/Rapport: Unable to Assess Affect (typically observed): Unable to Assess Orientation: : Oriented  to Self      Admission diagnosis:  Acute encephalopathy [G93.40] Altered mental status, unspecified altered mental status type [R41.82] Patient Active Problem List   Diagnosis Date Noted   Acute encephalopathy 01/13/2024   Dementia (HCC)    Status post right cataract extraction 04/22/2017   Primary open angle glaucoma (POAG) of left eye, severe stage 03/03/2017   Hallucination, visual 02/14/2017   Acute psychosis (HCC) 02/14/2017   Low back pain 11/08/2016   Combined forms of age-related cataract of right eye 05/01/2016   Osteoporosis 09/30/2012   Branch retinal artery occlusion, left eye 09/30/2012   Essential hypertension 04/05/2010   Venous (peripheral) insufficiency 04/05/2010   GERD 04/05/2010   CHEST PAIN 04/05/2010   PCP:  Feliciano Devoria LABOR, MD Pharmacy:   [id number] [place], [place] - [address] AT [facility] OF [address] [place] [address] Phone: [phone] Fax: [phone]  [facility] - [place], [place] - [address] [place] [address] [place] [address] Phone: [phone] Fax: [phone]     Social Drivers of Health (SDOH) Social History: SDOH Screenings   Food Insecurity: Patient Unable To Answer (01/14/2024)  Housing: Patient Unable To Answer (01/14/2024)  Transportation Needs: Patient Unable To Answer (01/14/2024)  Utilities: Patient Unable To Answer (01/14/2024)  Social Connections: Patient Unable To Answer (01/14/2024)  Tobacco Use: Low Risk (01/13/2024)   SDOH Interventions:     Readmission Risk Interventions     No data to display

## 2024-01-17 LAB — GLUCOSE, CAPILLARY
Glucose-Capillary: 126 mg/dL — ABNORMAL HIGH (ref 70–99)
Glucose-Capillary: 76 mg/dL (ref 70–99)
Glucose-Capillary: 83 mg/dL (ref 70–99)
Glucose-Capillary: 87 mg/dL (ref 70–99)
Glucose-Capillary: 92 mg/dL (ref 70–99)
Glucose-Capillary: 99 mg/dL (ref 70–99)

## 2024-01-17 MED ORDER — ENSURE PLUS HIGH PROTEIN PO LIQD
237.0000 mL | Freq: Two times a day (BID) | ORAL | Status: DC
  Administered 2024-01-17 – 2024-01-18 (×2): 237 mL via ORAL

## 2024-01-17 NOTE — Progress Notes (Signed)
 PROGRESS NOTE    Theresa Bauer  FMW:[id number]  DOB: Nov 28, 1929  DOA: 01/13/2024 PCP: Theresa Bauer LABOR, MD Outpatient Specialists:   Hospital course:  89 year old female, medical history significant for dementia, hypertension, GERD, presented to ED due to progressive lethargy, somnolence, poor oral intake and not getting out of bed, worse from her baseline.  At her baseline, she reportedly ambulates with a walker, conversant, intermittent confusion.  Admitted for acute encephalopathy of unclear etiology.  Extensive workup thus far unremarkable.  Hypothermic on admission which has since resolved.  Mental status changes improved and now getting agitated, resumed meds at reduced doses.  12/10: New hypoglycemia requiring D5 drip, infection workup, head CT/MRI negative 12/13: Patient ate breakfast with proved blood sugars, continue D5 drip  Subjective:  Patient seen with daughter at bedside who was feeding her.  Daughter thinks that patient is more alert since Namenda  was discontinued.  Notes her mother is eating better and is pleased that her mother is off the D5 fluids..  She is wondering if it would be good to decrease risperidone  dose.  Discussed potential recurrence of hypothermia, said would follow closely and would place patient on Bair hugger if warranted.  Daughter is adult nurse.  Objective: Vitals:   01/16/24 1753 01/16/24 2145 01/17/24 0532 01/17/24 0851  BP: (!) 158/109 (!) 130/53 (!) 112/57 (!) 144/74  Pulse: 74 75 69 87  Resp: 17 17 16 16   Temp: 98.1 F (36.7 C) (!) 97.5 F (36.4 C) 98.1 F (36.7 C) (!) 97.5 F (36.4 C)  TempSrc:  Oral Oral   SpO2: 95% 98% 100% 100%  Weight:      Height:        Intake/Output Summary (Last 24 hours) at 01/17/2024 1652 Last data filed at 01/17/2024 1300 Gross per 24 hour  Intake 963 ml  Output 650 ml  Net 313 ml   Filed Weights   01/13/24 1602  Weight: 61.7 kg     Exam:  General: Patient lying in bed drinking milk via  straw being held by attentive daughter at bedside, patient does not answer any of my questions Eyes: sclera anicteric, conjuctiva mild injection bilaterally CVS: S1-S2, regular  Respiratory:  decreased air entry bilaterally secondary to decreased inspiratory effort, rales at bases  GI: NABS, soft, NT  LE: Warm and well-perfused  Data Reviewed:  Basic Metabolic Panel: Recent Labs  Lab 01/13/24 1704 01/13/24 1840 01/14/24 0408  NA 141 141 139  K 5.6* 4.2 4.1  CL 105 101 102  CO2  --  28 29  GLUCOSE 105* 109* 116*  BUN 23 15 13   CREATININE 0.80 0.57 0.55  CALCIUM   --  9.3 8.9    CBC: Recent Labs  Lab 01/13/24 1704 01/13/24 1738 01/14/24 0408  WBC  --  5.6 8.1  NEUTROABS  --  2.8  --   HGB 13.9 12.3 12.5  HCT 41.0 38.3 39.0  MCV  --  74.4* 73.4*  PLT  --  151 172     Scheduled Meds:  dorzolamide -timolol   1 drop Both Eyes BID   enoxaparin  (LOVENOX ) injection  40 mg Subcutaneous Q24H   feeding supplement  237 mL Oral BID BM   gabapentin   100 mg Oral QHS   isosorbide  mononitrate  10 mg Oral BID   latanoprost   1 drop Both Eyes QHS   risperiDONE   1 mg Oral QHS   sodium chloride  flush  3 mL Intravenous Q12H   Continuous Infusions:   Assessment &  Plan:   Metabolic encephalopathy-extensive workup negative, improved with hydration. Baseline dementia with behavioral disturbances Thought to be secondary to medications on admission, now improving. Patient's daughter feels she is better off the Namenda  which was discontinued 12/12. She is wondering whether to decrease her risperidone  dose although this is the dose she was on at home.  Gabapentin  might also be contributory. Patient had been on Lasix  for HTN treatment, patient is improved with hydration, depressed mental status may have been secondary to slow progressive dehydration/intravascular volume depletion. Will discontinue Lasix , would not restart on discharge especially given decreased p.o. intake Extensive workup  was negative:CMP, CBC, serial troponin high high-sensitivity, folate, RPR, UA, UDS negative. CT head without acute findings. Chest x-ray negative. VBG without CO2 retention. Mildly elevated TSH/7.061 but free T4 normal and this is not likely the cause. Random plasma cortisol 32.6. B12 level elevated.   Hypoglycemia D5 drip has been discontinued, patient is eating more and seems to be maintaining her blood sugars. Infectious workup including head imaging was negative on 12/10 Patient ate breakfast this morning with improved blood sugar Patient remains on D5 drip  Hypothermia Present on admission, treated with Bair hugger with normalization of body temperature Body temperature stiffening down again, may need to restart Bair hugger Unclear etiology, infectious and metabolic workup are negative  HTN Blood pressure had rebounded with fluid resuscitation, now under better control with re-initiation of Imdur  yesterday. Would not not restart Lasix  at discharge as I suspect her metabolic encephalopathy may have been from intravascular volume depletion.    DVT prophylaxis: Lovenox  Code Status: DNR Family Communication: Daughter was at bedside throughout     Studies: No results found.   Principal Problem:   Acute encephalopathy Active Problems:   Essential hypertension   Dementia (HCC)     Theresa Bauer, [facility]  If 7PM-7AM, please contact night-coverage [email or web address]   LOS: 3 days

## 2024-01-17 NOTE — Plan of Care (Signed)
  Problem: Clinical Measurements: Goal: Ability to maintain clinical measurements within normal limits will improve Outcome: Progressing   Problem: Activity: Goal: Risk for activity intolerance will decrease Outcome: Progressing   Problem: Coping: Goal: Level of anxiety will decrease Outcome: Progressing   Problem: Safety: Goal: Ability to remain free from injury will improve Outcome: Progressing   Problem: Skin Integrity: Goal: Risk for impaired skin integrity will decrease Outcome: Progressing   

## 2024-01-17 NOTE — Plan of Care (Signed)
  Problem: Education: Goal: Knowledge of General Education information will improve Description: Including pain rating scale, medication(s)/side effects and non-pharmacologic comfort measures Outcome: Progressing   Problem: Activity: Goal: Risk for activity intolerance will decrease Outcome: Progressing   Problem: Pain Managment: Goal: General experience of comfort will improve and/or be controlled Outcome: Progressing

## 2024-01-18 LAB — BASIC METABOLIC PANEL WITH GFR
Anion gap: 8 (ref 5–15)
BUN: 15 mg/dL (ref 8–23)
CO2: 26 mmol/L (ref 22–32)
Calcium: 8.5 mg/dL — ABNORMAL LOW (ref 8.9–10.3)
Chloride: 105 mmol/L (ref 98–111)
Creatinine, Ser: 0.74 mg/dL (ref 0.44–1.00)
GFR, Estimated: >60 mL/min (ref >60)
Glucose, Bld: 88 mg/dL (ref 70–99)
Potassium: 3.8 mmol/L (ref 3.5–5.1)
Sodium: 139 mmol/L (ref 135–145)

## 2024-01-18 LAB — CBC
HCT: 33.6 % — ABNORMAL LOW (ref 36.0–46.0)
Hemoglobin: 11.2 g/dL — ABNORMAL LOW (ref 12.0–15.0)
MCH: 23.6 pg — ABNORMAL LOW (ref 26.0–34.0)
MCHC: 33.3 g/dL (ref 30.0–36.0)
MCV: 70.9 fL — ABNORMAL LOW (ref 80.0–100.0)
Platelets: 197 K/uL (ref 150–400)
RBC: 4.74 MIL/uL (ref 3.87–5.11)
RDW: 16.1 % — ABNORMAL HIGH (ref 11.5–15.5)
WBC: 6.2 K/uL (ref 4.0–10.5)
nRBC: 0 % (ref 0.0–0.2)

## 2024-01-18 LAB — FERRITIN: Ferritin: 96 ng/mL (ref 11–307)

## 2024-01-18 LAB — IRON AND TIBC
Iron: 47 ug/dL (ref 28–170)
Saturation Ratios: 21 % (ref 10.4–31.8)
TIBC: 224 ug/dL — ABNORMAL LOW (ref 250–450)
UIBC: 177 ug/dL

## 2024-01-18 LAB — VITAMIN B12: Vitamin B-12: 606 pg/mL (ref 180–914)

## 2024-01-18 LAB — GLUCOSE, CAPILLARY
Glucose-Capillary: 129 mg/dL — ABNORMAL HIGH (ref 70–99)
Glucose-Capillary: 144 mg/dL — ABNORMAL HIGH (ref 70–99)
Glucose-Capillary: 85 mg/dL (ref 70–99)
Glucose-Capillary: 98 mg/dL (ref 70–99)

## 2024-01-18 MED ORDER — MELATONIN 3 MG PO TABS: 3.0000 mg | ORAL_TABLET | Freq: Every evening | ORAL | Status: DC | PRN

## 2024-01-18 MED ORDER — RISPERIDONE 0.5 MG PO TABS
0.5000 mg | ORAL_TABLET | Freq: Every day | ORAL | Status: DC
  Filled 2024-01-18: qty 1

## 2024-01-18 MED ORDER — METOPROLOL TARTRATE 5 MG/5ML IV SOLN: 5.0000 mg | INTRAVENOUS | Status: DC | PRN

## 2024-01-18 MED ORDER — HYDRALAZINE HCL 20 MG/ML IJ SOLN: 10.0000 mg | INTRAMUSCULAR | Status: DC | PRN

## 2024-01-18 MED ORDER — ENSURE PLUS HIGH PROTEIN PO LIQD: 237.0000 mL | Freq: Two times a day (BID) | ORAL | Status: DC

## 2024-01-18 MED ORDER — RISPERIDONE 0.5 MG PO TABS: 0.5000 mg | ORAL_TABLET | Freq: Every day | ORAL | Status: AC

## 2024-01-18 MED ORDER — IPRATROPIUM-ALBUTEROL 0.5-2.5 (3) MG/3ML IN SOLN: 3.0000 mL | RESPIRATORY_TRACT | Status: DC | PRN

## 2024-01-18 NOTE — TOC Transition Note (Signed)
 Transition of Care [facility]) - Discharge Note   Patient Details  Name: Theresa Bauer MRN: [id number] Date of Birth: 06/21/1929  Transition [facility] Care [facility]) CM/SW Contact:  Jeoffrey LITTIE Moose, [place] Phone Number: 01/18/2024, 12:12 PM   Clinical Narrative:    Patient will DC to: [facility] Anticipated DC date: 01/18/24 Family notified: Yes Transport by: [facility]   Per MD patient ready for DC to [facility]. RN to call report prior to discharge [phone]. RN, patient, patient's family, and facility notified of DC. Discharge Summary and FL2 sent to facility. DC packet on chart. Ambulance transport requested for patient.   CSW will sign off for now as social work intervention is no longer needed. Please consult us  again if new needs arise.     Final next level of care: Long Term Nursing Home Barriers to Discharge: Barriers Resolved   Patient Goals and CMS Choice Patient states their goals for this hospitalization and ongoing recovery are:: SNF   Choice offered to / list presented to : Adult Children (daughter Leonie)      Discharge Placement   Existing PASRR number confirmed : 01/18/24          Patient [facility] bed at: [facility] Patient to be transferred to facility by: [facility] Name of family member notified: Leonie Patient and family notified of of transfer: 01/18/24  Discharge Plan and Services Additional resources added to the After Visit Summary for   In-house Referral: Clinical Social Work   Post Acute Care Choice: Skilled Nursing Facility                               Social Drivers of Health (SDOH) Interventions SDOH Screenings   Food Insecurity: Patient Unable To Answer (01/14/2024)  Housing: Patient Unable To Answer (01/14/2024)  Transportation Needs: Patient Unable To Answer (01/14/2024)  Utilities: Patient Unable To Answer (01/14/2024)  Social Connections: Patient Unable To Answer (01/14/2024)  Tobacco Use: Low Risk (01/13/2024)     Readmission Risk  Interventions     No data to display

## 2024-01-18 NOTE — Plan of Care (Signed)
  Problem: Education: Goal: Knowledge of General Education information will improve Description: Including pain rating scale, medication(s)/side effects and non-pharmacologic comfort measures Outcome: Progressing   Problem: Nutrition: Goal: Adequate nutrition will be maintained Outcome: Progressing   Problem: Elimination: Goal: Will not experience complications related to bowel motility Outcome: Progressing Goal: Will not experience complications related to urinary retention Outcome: Progressing   Problem: Pain Managment: Goal: General experience of comfort will improve and/or be controlled Outcome: Progressing

## 2024-01-18 NOTE — Progress Notes (Signed)
 Patient discharged, Important Message Letter mailed to patient.

## 2024-01-18 NOTE — Plan of Care (Signed)
 AVS printed out and gave to PTAR. Needs attended. Removed PIV. Kept safe. No complaints of pain. Problem: Safety: Goal: Ability to remain free from injury will improve Outcome: Progressing   Problem: Pain Managment: Goal: General experience of comfort will improve and/or be controlled Outcome: Progressing   Problem: Skin Integrity: Goal: Risk for impaired skin integrity will decrease Outcome: Progressing

## 2024-01-18 NOTE — Discharge Summary (Signed)
 Physician Discharge Summary  Theresa Bauer FMW:[id number] DOB: 05/03/1929 DOA: 01/13/2024  PCP: Feliciano Devoria LABOR, MD  Admit date: 01/13/2024 Discharge date: 01/18/2024  Admitted From: ALF Disposition:  ALF/SNF  Recommendations for Outpatient Follow-up:  Follow up with PCP in 1-2 weeks Please obtain BMP/CBC in one week your next doctors visit.  Stop Namenda , and Gabapentin  Reduce dose of Risperdal . Stop if needed. Make melatonin prn   Discharge Condition: Stable CODE STATUS: DNR Diet recommendation: Regular  Brief/Interim Summary: Brief Narrative:    89 year old female, medical history significant for dementia, hypertension, GERD, presented to ED due to progressive lethargy, somnolence, poor oral intake and not getting out of bed, worse from her baseline.  At her baseline, she reportedly ambulates with a walker, conversant, intermittent confusion.  Admitted for acute encephalopathy of unclear etiology.  Extensive workup thus far unremarkable.  Hypothermic on admission which has since resolved.  Mental status changes improved and now getting agitated, resumed meds at reduced doses.  Upon admission, meds were adjusted as above, rest of the work up was negative.   Stable for discharge.   Assessment & Plan:     Metabolic encephalopathy-extensive workup negative, improved with hydration. Baseline dementia with behavioral disturbances Suspect this could be medication related/polypharmacy.  Improved since discontinuing Namenda .  Will continue to reduce risperidone  as able.  She is on minimal gabapentin , which I will stop.  Rest of the workup overall has been unremarkable including CT head, chest x-ray, VBG, TSH/free T4, random cortisol, B12.    Hypoglycemia Off D5 fluids, doing better.  Encourage p.o. intake   Hypothermia Improved yesterday had fever but infectious workup has been negative.   HTN Continue Imdur .  Hold off on diuretics even upon discharge due to poor oral intake      DVT prophylaxis: Lovenox  Code Status: DNR Family Communication: Daughter was at bedside throughout Status is: Inpatient Remains inpatient appropriate because: DC   PT Follow up Recs: Skilled Nursing-Short Term Rehab (<3 Hours/Day)01/16/2024 1301  Subjective:  Feels better, daugther at bedside.   Examination:  General exam: Appears calm and comfortable  Respiratory system: Clear to auscultation. Respiratory effort normal. Cardiovascular system: S1 & S2 heard, RRR. No JVD, murmurs, rubs, gallops or clicks. No pedal edema. Gastrointestinal system: Abdomen is nondistended, soft and nontender. No organomegaly or masses felt. Normal bowel sounds heard. Central nervous system: Alert and oriented. No focal neurological deficits. Extremities: Symmetric 5 x 5 power. Skin: No rashes, lesions or ulcers Psychiatry: Judgement and insight appear normal. Mood & affect appropriate.    Discharge Diagnoses:  Principal Problem:   Acute encephalopathy Active Problems:   Essential hypertension   Dementia (HCC)      Discharge Exam: Vitals:   01/18/24 0532 01/18/24 0916  BP: (!) 116/45 128/81  Pulse: (!) 103 79  Resp:  16  Temp: 99.3 F (37.4 C) 99.6 F (37.6 C)  SpO2: 96% 99%   Vitals:   01/17/24 0851 01/17/24 2052 01/18/24 0532 01/18/24 0916  BP: (!) 144/74 (!) 131/56 (!) 116/45 128/81  Pulse: 87 98 (!) 103 79  Resp: 16   16  Temp: (!) 97.5 F (36.4 C) (!) 101 F (38.3 C) 99.3 F (37.4 C) 99.6 F (37.6 C)  TempSrc:  Oral Oral Oral  SpO2: 100% 95% 96% 99%  Weight:      Height:          Discharge Instructions   Allergies as of 01/18/2024       Reactions   Lisinopril  Anaphylaxis   Darvocet [propoxyphene N-acetaminophen ] Itching   Penicillins Hives, Itching   Itching Has patient had a PCN reaction causing immediate rash, facial/tongue/throat swelling, SOB or lightheadedness with hypotension: no Has patient had a PCN reaction causing severe rash involving mucus  membranes or skin necrosis: no Has patient had a PCN reaction that required hospitalization: no Has patient had a PCN reaction occurring within the last 10 years: no If all of the above answers are NO, then may proceed with Cephalosporin use.   Aspirin Hives   itching   Brimonidine Itching   Darvon [propoxyphene] Itching        Medication List     PAUSE taking these medications    furosemide  20 MG tablet Wait to take this until your doctor or other care provider tells you to start again. Commonly known as: LASIX  Take 20 mg by mouth daily.       STOP taking these medications    gabapentin  300 MG capsule Commonly known as: NEURONTIN    memantine  10 MG tablet Commonly known as: NAMENDA        TAKE these medications    acetaminophen  500 MG tablet Commonly known as: TYLENOL  Take 1,000 mg by mouth 2 (two) times daily.   Biofreeze 4 % Gel Generic drug: Menthol (Topical Analgesic) Apply 1 application  topically at bedtime. To the left shoulder and left/right hip, thighs and both knees.   Calcium -Vitamin D-Minerals 600-800 MG-UNIT Chew Chew 1 tablet by mouth 2 (two) times daily.   Cholecalciferol  125 MCG (5000 UT) capsule Take 5,000 Units by mouth in the morning.   dorzolamide -timolol  2-0.5 % ophthalmic solution Commonly known as: COSOPT  Place 1 drop into both eyes 2 (two) times daily.   eucerin lotion Apply 1 Application topically at bedtime. To back and feet, dry skin   feeding supplement Liqd Take 237 mLs by mouth 2 (two) times daily between meals.   ferrous sulfate 325 (65 FE) MG tablet Take 325 mg by mouth 3 (three) times a week. Monday, Wednesday, friday   isosorbide  mononitrate 10 MG tablet Commonly known as: ISMO  Take 10 mg by mouth 2 (two) times daily. Take one tablet (10mg ) by mouth twice per day; hold for SBP<120.   latanoprost  0.005 % ophthalmic solution Commonly known as: XALATAN  Place 1 drop into both eyes at bedtime.   lidocaine  4  % Place 2 patches onto the skin daily. Apply 1 patch topically to the mid back and 1 patch to right hip every morning and remove at night   melatonin 3 MG Tabs tablet Take 1 tablet (3 mg total) by mouth at bedtime as needed. What changed:  when to take this reasons to take this   risperiDONE  0.5 MG tablet Commonly known as: RISPERDAL  Take 1 tablet (0.5 mg total) by mouth at bedtime. What changed:  medication strength how much to take   senna-docusate 8.6-50 MG tablet Commonly known as: Senokot-S Take 1 tablet by mouth at bedtime. What changed: when to take this        Follow-up Information     Feliciano Devoria LABOR, MD Follow up in 1 week(s).   Specialty: Family Medicine Contact information: [address] [place] [address] [phone]                Allergies[1]  You were cared for by a hospitalist during your hospital stay. If you have any questions about your discharge medications or the care you received while you were in the hospital after you  are discharged, you can call the unit and asked to speak with the hospitalist on call if the hospitalist that took care of you is not available. Once you are discharged, your primary care physician will handle any further medical issues. Please note that no refills for any discharge medications will be authorized once you are discharged, as it is imperative that you return to your primary care physician (or establish a relationship with a primary care physician if you do not have one) for your aftercare needs so that they can reassess your need for medications and monitor your lab values.  You were cared for by a hospitalist during your hospital stay. If you have any questions about your discharge medications or the care you received while you were in the hospital after you are discharged, you can call the unit and asked to speak with the hospitalist on call if the hospitalist that took care of you is not available. Once you are  discharged, your primary care physician will handle any further medical issues. Please note that NO REFILLS for any discharge medications will be authorized once you are discharged, as it is imperative that you return to your primary care physician (or establish a relationship with a primary care physician if you do not have one) for your aftercare needs so that they can reassess your need for medications and monitor your lab values.  Please request your Prim.MD to go over all Hospital Tests and Procedure/Radiological results at the follow up, please get all Hospital records sent to your Prim MD by signing hospital release before you go home.  Get CBC, CMP, 2 view Chest X ray checked  by Primary MD during your next visit or SNF MD in 5-7 days ( we routinely change or add medications that can affect your baseline labs and fluid status, therefore we recommend that you get the mentioned basic workup next visit with your PCP, your PCP may decide not to get them or add new tests based on their clinical decision)  On your next visit with your primary care physician please Get Medicines reviewed and adjusted.  If you experience worsening of your admission symptoms, develop shortness of breath, life threatening emergency, suicidal or homicidal thoughts you must seek medical attention immediately by calling 911 or calling your MD immediately  if symptoms less severe.  You Must read complete instructions/literature along with all the possible adverse reactions/side effects for all the Medicines you take and that have been prescribed to you. Take any new Medicines after you have completely understood and accpet all the possible adverse reactions/side effects.   Do not drive, operate heavy machinery, perform activities at heights, swimming or participation in water activities or provide baby sitting services if your were admitted for syncope or siezures until you have seen by Primary MD or a Neurologist and advised  to do so again.  Do not drive when taking Pain medications.   Procedures/Studies: EEG adult Result Date: 01/14/2024 Shelton Arlin KIDD, MD     01/14/2024  5:20 PM Patient Name: TIERA MENSINGER MRN: [id number] Epilepsy Attending: Arlin KIDD Shelton Referring Physician/Provider: Charlton Evalene RAMAN, MD Date: 01/14/2024 Duration: 34.05 mins Patient history: 89yo F with ams, EEG to evaluate for seizure Level of alertness: Awake/ lethargic AEDs during EEG study: None Technical aspects: This EEG study was done with scalp electrodes positioned according to the 10-20 International system of electrode placement. Electrical activity was reviewed with band pass filter of 1-70Hz , sensitivity of 7 uV/mm,  display speed of 81mm/sec with a 60Hz  notched filter applied as appropriate. EEG data were recorded continuously and digitally stored.  Video monitoring was available and reviewed as appropriate. Description:  EEG showed continuous generalized predominantly 5 to 6 Hz theta slowing admixed with intermittent 2-3hz  delta slowing. Hyperventilation and photic stimulation were not performed.    ABNORMALITY - Continuous slow, generalized  IMPRESSION: This study is suggestive of generalized cerebral dysfunction ( encephalopathy). No seizures or epileptiform discharges were seen throughout the recording.  Arlin MALVA Krebs   MR BRAIN WO CONTRAST Result Date: 01/14/2024 EXAM: MR Brain without Intravenous Contrast. CLINICAL HISTORY: 89 year old female. Mental status change, unknown cause. TECHNIQUE: Magnetic resonance images of the brain without intravenous contrast in multiple planes. CONTRAST: Without. COMPARISON: Head CT 01/13/2024 and earlier. FINDINGS: BRAIN: No restricted diffusion to indicate acute infarction. No intracranial mass or hemorrhage. No midline shift or extra-axial fluid collection. No cerebellar tonsillar ectopia. The central arterial and venous flow voids are patent. Brain volume has not significantly changed from last  year. Patchy and confluent bilateral cerebral white matter T2 and FLAIR hyperintensity. Similar moderate T2 heterogeneity throughout the bilateral deep gray nuclei. Brainstem and cerebellum are relatively spared. Minimal chronic cerebral blood products; fewer than 6 chronic microhemorrhages identified scattered in the cerebral hemispheres. VENTRICLES: No hydrocephalus. ORBITS: The orbits are normal. SINUSES AND MASTOIDS: Chronic paranasal sinus disease with mucoperiosteal thickening, predominantly on the right side appears stable recently but progressed since 2024. The mastoid air cells are well aerated. Grossly normal visible internal auditory structures. BONES: No acute fracture or focal osseous lesion. CERVICAL SPINE: Negative for age visible cervical spine. IMPRESSION: 1. No acute intracranial abnormality. 2. Moderate-for-age signal changes in the deep gray nuclei and cerebral white matter, with a small number of chronic microhemorrhages, compatible with chronic small vessel disease. 3. Advanced chronic paranasal sinus disease, progressed since last year. Electronically signed by: Helayne Hurst MD 01/14/2024 12:22 PM EST RP Workstation: [id number]   CT Head Wo Contrast Result Date: 01/13/2024 CLINICAL DATA:  Altered level of consciousness EXAM: CT HEAD WITHOUT CONTRAST TECHNIQUE: Contiguous axial images were obtained from the base of the skull through the vertex without intravenous contrast. RADIATION DOSE REDUCTION: This exam was performed according to the departmental dose-optimization program which includes automated exposure control, adjustment of the mA and/or kV according to patient size and/or use of iterative reconstruction technique. COMPARISON:  12/03/2022 FINDINGS: Brain: No acute infarct or hemorrhage. The lateral ventricles and midline structures are stable. Stable hypodensities in the periventricular white matter and bilateral basal ganglia consistent with chronic small vessel ischemic changes.  No acute extra-axial fluid collections. No mass effect. Vascular: No hyperdense vessel or unexpected calcification. Skull: Normal. Negative for fracture or focal lesion. Sinuses/Orbits: Complete opacification of the right frontal, ethmoid, maxillary, and sphenoid sinuses. Mild mucosal thickening in the left sphenoid sinus. Other: None. IMPRESSION: 1. No acute intracranial process. 2. Predominantly right-sided paranasal sinus disease as above. Electronically Signed   By: Ozell Daring M.D.   On: 01/13/2024 18:13   DG Chest 1 View Result Date: 01/13/2024 EXAM: 1 VIEW(S) XRAY OF THE CHEST 01/13/2024 05:26:16 PM COMPARISON: 07/27/2022 CLINICAL HISTORY: ams, cough FINDINGS: LUNGS AND PLEURA: No focal pulmonary opacity. No pleural effusion. No pneumothorax. HEART AND MEDIASTINUM: Mild cardiomegaly. No acute abnormality of the mediastinal silhouette. BONES AND SOFT TISSUES: No acute osseous abnormality. IMPRESSION: 1. No acute cardiopulmonary process identified. Electronically signed by: Lynwood Seip MD 01/13/2024 05:44 PM EST RP Workstation: [id number]  The results of significant diagnostics from this hospitalization (including imaging, microbiology, ancillary and laboratory) are listed below for reference.     Microbiology: Recent Results (from the past 240 hours)  Resp panel by RT-PCR (RSV, Flu A&B, Covid) Anterior Nasal Swab     Status: None   Collection Time: 01/13/24  4:47 PM   Specimen: Anterior Nasal Swab  Result Value Ref Range Status   SARS Coronavirus 2 by RT PCR NEGATIVE NEGATIVE Final   Influenza A by PCR NEGATIVE NEGATIVE Final   Influenza B by PCR NEGATIVE NEGATIVE Final    Comment: (NOTE) The Xpert Xpress SARS-CoV-2/FLU/RSV plus assay is intended as an aid in the diagnosis of influenza from Nasopharyngeal swab specimens and should not be used as a sole basis for treatment. Nasal washings and aspirates are unacceptable for Xpert Xpress SARS-CoV-2/FLU/RSV testing.  Fact Sheet for  Patients: bloggercourse.com  Fact Sheet for Healthcare Providers: seriousbroker.it  This test is not yet approved or cleared by the [place]  FDA and has been authorized for detection and/or diagnosis of SARS-CoV-2 by FDA under an Emergency Use Authorization (EUA). This EUA will remain in effect (meaning this test can be used) for the duration of the COVID-19 declaration under Section 564(b)(1) of the Act, 21 U.S.C. section 360bbb-3(b)(1), unless the authorization is terminated or revoked.     Resp Syncytial Virus by PCR NEGATIVE NEGATIVE Final    Comment: (NOTE) Fact Sheet for Patients: bloggercourse.com  Fact Sheet for Healthcare Providers: seriousbroker.it  This test is not yet approved or cleared by the [place]  FDA and has been authorized for detection and/or diagnosis of SARS-CoV-2 by FDA under an Emergency Use Authorization (EUA). This EUA will remain in effect (meaning this test can be used) for the duration of the COVID-19 declaration under Section 564(b)(1) of the Act, 21 U.S.C. section 360bbb-3(b)(1), unless the authorization is terminated or revoked.  Performed at [facility], [address], [place], [place] [address]      Labs: BNP (last 3 results) No results for input(s): BNP in the last 8760 hours. Basic Metabolic Panel: Recent Labs  Lab 01/13/24 1704 01/13/24 1840 01/14/24 0408 01/18/24 0512  NA 141 141 139 139  K 5.6* 4.2 4.1 3.8  CL 105 101 102 105  CO2  --  28 29 26   GLUCOSE 105* 109* 116* 88  BUN 23 15 13 15   CREATININE 0.80 0.57 0.55 0.74  CALCIUM   --  9.3 8.9 8.5*   Liver Function Tests: Recent Labs  Lab 01/13/24 1840  AST 25  ALT 22  ALKPHOS 98  BILITOT 1.2  PROT 7.7  ALBUMIN 3.2*   Recent Labs  Lab 01/13/24 1840  LIPASE 22   Recent Labs  Lab 01/13/24 2314  AMMONIA 24   CBC: Recent Labs  Lab  01/13/24 1704 01/13/24 1738 01/14/24 0408 01/18/24 0512  WBC  --  5.6 8.1 6.2  NEUTROABS  --  2.8  --   --   HGB 13.9 12.3 12.5 11.2*  HCT 41.0 38.3 39.0 33.6*  MCV  --  74.4* 73.4* 70.9*  PLT  --  151 172 197   Cardiac Enzymes: No results for input(s): CKTOTAL, CKMB, CKMBINDEX, TROPONINI in the last 168 hours. BNP: Invalid input(s): POCBNP CBG: Recent Labs  Lab 01/17/24 1738 01/17/24 2050 01/18/24 0021 01/18/24 0530 01/18/24 0811  GLUCAP 99 92 98 85 144*   D-Dimer No results for input(s): DDIMER in the last 72 hours. Hgb A1c No results  for input(s): HGBA1C in the last 72 hours. Lipid Profile No results for input(s): CHOL, HDL, LDLCALC, TRIG, CHOLHDL, LDLDIRECT in the last 72 hours. Thyroid  function studies No results for input(s): TSH, T4TOTAL, T3FREE, THYROIDAB in the last 72 hours.  Invalid input(s): FREET3 Anemia work up Recent Labs    01/18/24 1007  VITAMINB12 606  FERRITIN 96  TIBC 224*  IRON 47   Urinalysis    Component Value Date/Time   COLORURINE YELLOW 01/13/2024 1718   APPEARANCEUR CLEAR 01/13/2024 1718   LABSPEC 1.015 01/13/2024 1718   PHURINE 7.0 01/13/2024 1718   GLUCOSEU 100 (A) 01/13/2024 1718   HGBUR NEGATIVE 01/13/2024 1718   BILIRUBINUR NEGATIVE 01/13/2024 1718   KETONESUR NEGATIVE 01/13/2024 1718   PROTEINUR NEGATIVE 01/13/2024 1718   UROBILINOGEN 0.2 12/29/2018 1523   NITRITE NEGATIVE 01/13/2024 1718   LEUKOCYTESUR NEGATIVE 01/13/2024 1718   Sepsis Labs Recent Labs  Lab 01/13/24 1738 01/14/24 0408 01/18/24 0512  WBC 5.6 8.1 6.2   Microbiology Recent Results (from the past 240 hours)  Resp panel by RT-PCR (RSV, Flu A&B, Covid) Anterior Nasal Swab     Status: None   Collection Time: 01/13/24  4:47 PM   Specimen: Anterior Nasal Swab  Result Value Ref Range Status   SARS Coronavirus 2 by RT PCR NEGATIVE NEGATIVE Final   Influenza A by PCR NEGATIVE NEGATIVE Final   Influenza B by PCR  NEGATIVE NEGATIVE Final    Comment: (NOTE) The Xpert Xpress SARS-CoV-2/FLU/RSV plus assay is intended as an aid in the diagnosis of influenza from Nasopharyngeal swab specimens and should not be used as a sole basis for treatment. Nasal washings and aspirates are unacceptable for Xpert Xpress SARS-CoV-2/FLU/RSV testing.  Fact Sheet for Patients: bloggercourse.com  Fact Sheet for Healthcare Providers: seriousbroker.it  This test is not yet approved or cleared by the [place]  FDA and has been authorized for detection and/or diagnosis of SARS-CoV-2 by FDA under an Emergency Use Authorization (EUA). This EUA will remain in effect (meaning this test can be used) for the duration of the COVID-19 declaration under Section 564(b)(1) of the Act, 21 U.S.C. section 360bbb-3(b)(1), unless the authorization is terminated or revoked.     Resp Syncytial Virus by PCR NEGATIVE NEGATIVE Final    Comment: (NOTE) Fact Sheet for Patients: bloggercourse.com  Fact Sheet for Healthcare Providers: seriousbroker.it  This test is not yet approved or cleared by the [place]  FDA and has been authorized for detection and/or diagnosis of SARS-CoV-2 by FDA under an Emergency Use Authorization (EUA). This EUA will remain in effect (meaning this test can be used) for the duration of the COVID-19 declaration under Section 564(b)(1) of the Act, 21 U.S.C. section 360bbb-3(b)(1), unless the authorization is terminated or revoked.  Performed at [facility], [address], [place], [place] [address]      Time coordinating discharge:  I have spent 35 minutes face to face with the patient and on the ward discussing the patients care, assessment, plan and disposition with other care givers. >50% of the time was devoted counseling the patient about the risks and benefits of treatment/Discharge  disposition and coordinating care.   SIGNED:   Burgess JAYSON Dare, MD  [facility] 01/18/2024, 11:18 AM   If 7PM-7AM, please contact night-coverage      [1]  Allergies Allergen Reactions   Lisinopril Anaphylaxis   Darvocet [Propoxyphene N-Acetaminophen ] Itching   Penicillins Hives and Itching    Itching Has patient had a PCN reaction  causing immediate rash, facial/tongue/throat swelling, SOB or lightheadedness with hypotension: no Has patient had a PCN reaction causing severe rash involving mucus membranes or skin necrosis: no Has patient had a PCN reaction that required hospitalization: no Has patient had a PCN reaction occurring within the last 10 years: no If all of the above answers are NO, then may proceed with Cephalosporin use.    Aspirin Hives    itching   Brimonidine Itching   Darvon [Propoxyphene] Itching

## 2024-01-18 NOTE — Care Management Important Message (Signed)
 Important Message  Patient Details  Name: Theresa Bauer MRN: [id number] Date of Birth: Sep 10, 1929   Important Message Given:  No     [name] 01/18/2024, 2:08 PM

## 2024-01-18 NOTE — Progress Notes (Signed)
 SLP Cancellation Note  Patient Details Name: ROSEALEE RECINOS MRN: [id number] DOB: August 24, 1929   Cancelled treatment:       Reason Eval/Treat Not Completed: Other (comment) (Patient being d/c'd back to SNF today. Defer cognitive-linguistic evaluation if needed to SNF.)  Rea Pass MA, CCC-SLP  Adelina Collard Meryl 01/18/2024, 11:46 AM

## 2024-01-18 NOTE — Care Plan (Signed)
 [place]. Gave report to Sabrina, LPN with no questions asked.

## 2024-01-18 NOTE — TOC Progression Note (Signed)
 Transition of Care [facility]) - Progression Note    Patient Details  Name: Theresa Bauer MRN: [id number] Date of Birth: Aug 18, 1929  Transition of Care [facility]) CM/SW Contact  Oluwatimileyin Vivier LITTIE Mo[place], [place] Phone Number: 01/18/2024, 11:15 AM  Clinical Narrative:    CSW confirmed pt can return to [facility] following hospital DC. CSW will continue to follow.   Expected Discharge Plan: Skilled Nursing Facility Barriers to Discharge: Continued Medical Work up, SNF Pending bed offer               Expected Discharge Plan and Services In-house Referral: Clinical Social Work   Post Acute Care Choice: Skilled Nursing Facility Living arrangements for the past 2 months: Skilled Nursing Facility                                       Social Drivers of Health (SDOH) Interventions SDOH Screenings   Food Insecurity: Patient Unable To Answer (01/14/2024)  Housing: Patient Unable To Answer (01/14/2024)  Transportation Needs: Patient Unable To Answer (01/14/2024)  Utilities: Patient Unable To Answer (01/14/2024)  Social Connections: Patient Unable To Answer (01/14/2024)  Tobacco Use: Low Risk (01/13/2024)    Readmission Risk Interventions     No data to display

## 2024-01-18 NOTE — Hospital Course (Addendum)
 Brief Narrative:    89 year old female, medical history significant for dementia, hypertension, GERD, presented to ED due to progressive lethargy, somnolence, poor oral intake and not getting out of bed, worse from her baseline.  At her baseline, she reportedly ambulates with a walker, conversant, intermittent confusion.  Admitted for acute encephalopathy of unclear etiology.  Extensive workup thus far unremarkable.  Hypothermic on admission which has since resolved.  Mental status changes improved and now getting agitated, resumed meds at reduced doses.  Upon admission, meds were adjusted as above, rest of the work up was negative.   Stable for discharge.   Assessment & Plan:     Metabolic encephalopathy-extensive workup negative, improved with hydration. Baseline dementia with behavioral disturbances Suspect this could be medication related/polypharmacy.  Improved since discontinuing Namenda .  Will continue to reduce risperidone  as able.  She is on minimal gabapentin , which I will stop.  Rest of the workup overall has been unremarkable including CT head, chest x-ray, VBG, TSH/free T4, random cortisol, B12.    Hypoglycemia Off D5 fluids, doing better.  Encourage p.o. intake   Hypothermia Improved yesterday had fever but infectious workup has been negative.   HTN Continue Imdur .  Hold off on diuretics even upon discharge due to poor oral intake     DVT prophylaxis: Lovenox  Code Status: DNR Family Communication: Daughter was at bedside throughout Status is: Inpatient Remains inpatient appropriate because: DC   PT Follow up Recs: Skilled Nursing-Short Term Rehab (<3 Hours/Day)01/16/2024 1301  Subjective:  Feels better, daugther at bedside.   Examination:  General exam: Appears calm and comfortable  Respiratory system: Clear to auscultation. Respiratory effort normal. Cardiovascular system: S1 & S2 heard, RRR. No JVD, murmurs, rubs, gallops or clicks. No pedal  edema. Gastrointestinal system: Abdomen is nondistended, soft and nontender. No organomegaly or masses felt. Normal bowel sounds heard. Central nervous system: Alert and oriented. No focal neurological deficits. Extremities: Symmetric 5 x 5 power. Skin: No rashes, lesions or ulcers Psychiatry: Judgement and insight appear normal. Mood & affect appropriate.

## 2024-02-12 ENCOUNTER — Emergency Department (HOSPITAL_COMMUNITY)

## 2024-02-12 ENCOUNTER — Other Ambulatory Visit: Payer: Self-pay

## 2024-02-12 ENCOUNTER — Emergency Department (HOSPITAL_COMMUNITY): Admission: EM | Admit: 2024-02-12 | Discharge: 2024-02-13 | Disposition: A | Discharge status: Skilled Nursing Facility

## 2024-02-12 DIAGNOSIS — I10 Essential (primary) hypertension: Secondary | ICD-10-CM | POA: Diagnosis not present

## 2024-02-12 DIAGNOSIS — Y92129 Unspecified place in nursing home as the place of occurrence of the external cause: Secondary | ICD-10-CM | POA: Diagnosis not present

## 2024-02-12 DIAGNOSIS — M542 Cervicalgia: Secondary | ICD-10-CM | POA: Diagnosis not present

## 2024-02-12 DIAGNOSIS — F0390 Unspecified dementia without behavioral disturbance: Secondary | ICD-10-CM | POA: Insufficient documentation

## 2024-02-12 DIAGNOSIS — M25522 Pain in left elbow: Secondary | ICD-10-CM | POA: Insufficient documentation

## 2024-02-12 DIAGNOSIS — M79602 Pain in left arm: Secondary | ICD-10-CM | POA: Diagnosis not present

## 2024-02-12 DIAGNOSIS — W07XXXA Fall from chair, initial encounter: Secondary | ICD-10-CM | POA: Insufficient documentation

## 2024-02-12 DIAGNOSIS — W19XXXA Unspecified fall, initial encounter: Secondary | ICD-10-CM

## 2024-02-12 DIAGNOSIS — R519 Headache, unspecified: Secondary | ICD-10-CM | POA: Diagnosis present

## 2024-02-12 DIAGNOSIS — Z79899 Other long term (current) drug therapy: Secondary | ICD-10-CM | POA: Diagnosis not present

## 2024-02-12 DIAGNOSIS — M25512 Pain in left shoulder: Secondary | ICD-10-CM | POA: Insufficient documentation

## 2024-02-12 LAB — BASIC METABOLIC PANEL WITH GFR
Anion gap: 11 (ref 5–15)
BUN: 21 mg/dL (ref 8–23)
CO2: 26 mmol/L (ref 22–32)
Calcium: 9.9 mg/dL (ref 8.9–10.3)
Chloride: 100 mmol/L (ref 98–111)
Creatinine, Ser: 0.65 mg/dL (ref 0.44–1.00)
GFR, Estimated: >60 mL/min
Glucose, Bld: 95 mg/dL (ref 70–99)
Potassium: 4.2 mmol/L (ref 3.5–5.1)
Sodium: 137 mmol/L (ref 135–145)

## 2024-02-12 LAB — CK: Total CK: 55 U/L (ref 38–234)

## 2024-02-12 LAB — CBC
HCT: 36.5 % (ref 36.0–46.0)
Hemoglobin: 12 g/dL (ref 12.0–15.0)
MCH: 23.9 pg — ABNORMAL LOW (ref 26.0–34.0)
MCHC: 32.9 g/dL (ref 30.0–36.0)
MCV: 72.7 fL — ABNORMAL LOW (ref 80.0–100.0)
Platelets: 241 K/uL (ref 150–400)
RBC: 5.02 MIL/uL (ref 3.87–5.11)
RDW: 15.8 % — ABNORMAL HIGH (ref 11.5–15.5)
WBC: 5.5 K/uL (ref 4.0–10.5)
nRBC: 0 % (ref 0.0–0.2)

## 2024-02-12 NOTE — ED Provider Notes (Signed)
 " [place] [facility] Provider Note   CSN: [id number] Arrival date & time: 02/12/24  2142     Patient presents with: Theresa Bauer is a 89 y.o. female with history of dementia, GERD, glaucoma, hypertension.  Presents to ED complaining of fall.  Apparently the patient was found down at her facility.  Per triage note, patient being brought in from [facility] after unwitnessed fall.  Patient found down after 5 minutes per facility.  Patient complaining of headache, left-sided arm pain and shoulder pain.  No thinners.  Patient is poor historian, unable to fully give details of the event.  She does endorse a headache and neck pain.  Denies any chest pain or shortness of breath.  Does report that she has some left shoulder pain.  She states she has back pain but cannot describe where.  Denies any pain in the lower extremities.  Reports that she fell because she was trying to sit into a chair and missed the chair falling backwards.   Fall       Prior to Admission medications  Medication Sig Start Date End Date Taking? Authorizing Provider  acetaminophen  (TYLENOL ) 500 MG tablet Take 1,000 mg by mouth 2 (two) times daily.   Yes [provider]  amLODipine  (NORVASC ) 5 MG tablet Take 5 mg by mouth daily. 01/20/24  Yes [provider]  Calcium  Carbonate-Vit D-Min (CALCIUM -VITAMIN D-MINERALS) 600-800 MG-UNIT CHEW Chew 1 tablet by mouth 2 (two) times daily.   Yes [provider]  Cholecalciferol  125 MCG (5000 UT) capsule Take 5,000 Units by mouth in the morning.   Yes [provider]  dorzolamide -timolol  (COSOPT ) 22.3-6.8 MG/ML ophthalmic solution Place 1 drop into both eyes 2 (two) times daily.  03/11/19  Yes [provider]  Emollient (EUCERIN) lotion Apply 1 Application topically at bedtime. To back and feet, dry skin   Yes [provider]  famotidine  (PEPCID ) 40 MG tablet Take 40 mg by mouth daily. 02/08/24   Yes [provider]  ferrous sulfate 325 (65 FE) MG tablet Take 325 mg by mouth 3 (three) times a week. Monday, Wednesday, friday   Yes [provider]  [Paused] furosemide  (LASIX ) 20 MG tablet Take 20 mg by mouth daily. Wait to take this until your doctor or other care provider tells you to start again. 04/11/19  Yes [provider]  isosorbide  mononitrate (ISMO ) 10 MG tablet Take 10 mg by mouth 2 (two) times daily. Take one tablet (10mg ) by mouth twice per day; hold for SBP<120. 01/10/24  Yes [provider]  latanoprost  (XALATAN ) 0.005 % ophthalmic solution Place 1 drop into both eyes at bedtime. 05/25/19  Yes [provider]  Lidocaine  4 % PTCH Place 2 patches onto the skin daily. Apply 1 patch topically to the mid back and 1 patch to right hip every morning and remove at night   Yes [provider]  LORazepam (ATIVAN) 0.5 MG tablet Take 0.5 mg by mouth 2 (two) times daily as needed for anxiety. 02/08/24 02/22/24 Yes [provider]  meloxicam (MOBIC) 7.5 MG tablet Take 7.5 mg by mouth daily. 01/30/24  Yes [provider]  Menthol, Topical Analgesic, (BIOFREEZE) 4 % GEL Apply 1 application  topically at bedtime. To the left shoulder and left/right hip, thighs and both knees.   Yes [provider]  risperiDONE  (RISPERDAL ) 0.5 MG tablet Take 1 tablet (0.5 mg total) by mouth at bedtime. 01/18/24  Yes Amin, Ankit C, MD  senna-docusate (SENOKOT-S) 8.6-50 MG tablet Take 1 tablet by mouth at bedtime. Patient taking differently: Take 1 tablet by mouth 2 (two) times daily. 11/11/16  Yes Jerri Keys, MD    Allergies: Lisinopril, Darvocet [propoxyphene n-acetaminophen ], Penicillins, Aspirin, Brimonidine, and Darvon [propoxyphene]    Review of Systems  Unable to perform ROS: Dementia (Level 5 caveat)  All other systems reviewed and are negative.   Updated Vital Signs BP (!) 120/90 (BP Location: Left Arm)   Pulse 100   Temp 97.9 F  (36.6 C) (Oral)   Resp 18   Ht 5' 6 (1.676 m)   Wt 61.7 kg   SpO2 100%   BMI 21.95 kg/m   Physical Exam Vitals and nursing note reviewed.  Constitutional:      General: She is not in acute distress.    Appearance: She is well-developed.  HENT:     Head: Normocephalic and atraumatic.  Eyes:     Conjunctiva/sclera: Conjunctivae normal.  Cardiovascular:     Rate and Rhythm: Normal rate and regular rhythm.     Heart sounds: No murmur heard. Pulmonary:     Effort: Pulmonary effort is normal. No respiratory distress.     Breath sounds: Normal breath sounds.     Comments: Chest wall stable Abdominal:     Palpations: Abdomen is soft.     Tenderness: There is no abdominal tenderness.     Comments: No tenderness to abdomen.  Musculoskeletal:        General: No swelling.     Cervical back: Neck supple.       Back:     Comments: Full ROM left shoulder, left elbow, left wrist.  No obvious deformity.  Skin:    General: Skin is warm and dry.     Capillary Refill: Capillary refill takes less than 2 seconds.     Comments: No evidence of skin tears throughout  Neurological:     Mental Status: She is alert and oriented to person, place, and time. Mental status is at baseline.  Psychiatric:        Mood and Affect: Mood normal.     (all labs ordered are listed, but only abnormal results are displayed) Labs Reviewed  CBC - Abnormal; Notable for the following components:      Result Value   MCV 72.7 (*)    MCH 23.9 (*)    RDW 15.8 (*)    All other components within normal limits  BASIC METABOLIC PANEL WITH GFR  CK    EKG: None  Radiology: CT Thoracic Spine Wo Contrast Result Date: 02/13/2024 EXAM: CT THORACIC SPINE WITHOUT CONTRAST 02/12/2024 11:45:43 PM TECHNIQUE: CT of the thoracic spine was performed without the administration of intravenous contrast. Multiplanar reformatted images are provided for review. Automated exposure control, iterative reconstruction, and/or weight  based adjustment of the mA/kV was utilized to reduce the radiation dose to as low as reasonably achievable. COMPARISON: None available. CLINICAL HISTORY: Back trauma, no prior imaging (Age >= 16y). FINDINGS: BONES AND ALIGNMENT: Normal vertebral body heights. No acute fracture or suspicious bone lesion. Midthoracic dextroscoliosis. DEGENERATIVE CHANGES: No significant degenerative changes. SOFT TISSUES: Calcific aortic atherosclerosis. Cardiomegaly. No acute abnormality. IMPRESSION: 1. No acute abnormality of the thoracic spine Electronically signed by: Franky Stanford MD MD 02/13/2024 12:10 AM EST RP Workstation: [id number]   CT Lumbar Spine Wo Contrast Result Date: 02/12/2024 EXAM: CT OF THE LUMBAR SPINE WITHOUT CONTRAST 02/12/2024 11:45:43 PM TECHNIQUE: CT of the  lumbar spine was performed without the administration of intravenous contrast. Multiplanar reformatted images are provided for review. Automated exposure control, iterative reconstruction, and/or weight based adjustment of the mA/kV was utilized to reduce the radiation dose to as low as reasonably achievable. COMPARISON: 05/31/2020. CLINICAL HISTORY: Back trauma, no prior imaging (Age >= 16y). FINDINGS: BONES AND ALIGNMENT: Normal vertebral body heights except for chronic height loss at L1, which is unchanged. New mixed sclerotic appearance of the L3 vertebral body with sclerosis below the superior endplate, likely a subacute to chronic compression fracture. Grade 1 anterolisthesis at L4-L5 is unchanged. No acute fracture. Normal alignment otherwise. DEGENERATIVE CHANGES: Bilateral foraminal stenosis at L4-L5 and L5-S1. Multilevel lower lumbar facet arthrosis. Moderate spinal canal stenosis at L4-L5. SOFT TISSUES: Calcific aortic atherosclerosis. No acute abnormality. IMPRESSION: 1. No acute findings. 2. New mixed sclerotic appearance of the L3 vertebral body with sclerosis below the superior endplate, likely a subacute to chronic compression fracture. 3.  Chronic height loss at L1, unchanged. Electronically signed by: Franky Stanford MD MD 02/12/2024 11:54 PM EST RP Workstation: [id number]   CT Cervical Spine Wo Contrast Result Date: 02/12/2024 EXAM: CT CERVICAL SPINE WITHOUT CONTRAST 02/12/2024 11:45:43 PM TECHNIQUE: CT of the cervical spine was performed without the administration of intravenous contrast. Multiplanar reformatted images are provided for review. Automated exposure control, iterative reconstruction, and/or weight based adjustment of the mA/kV was utilized to reduce the radiation dose to as low as reasonably achievable. COMPARISON: None available. CLINICAL HISTORY: Neck trauma (Age >= 65y) FINDINGS: BONES AND ALIGNMENT: Grade 1 retrolisthesis at C3-C4. Mild vertebral body height loss. No spinal canal stenosis. No acute fracture or traumatic malalignment. DEGENERATIVE CHANGES: Multilevel disc space narrowing. SOFT TISSUES: No prevertebral soft tissue swelling. IMPRESSION: 1. No acute cervical spine fracture or traumatic malalignment. Electronically signed by: Franky Stanford MD MD 02/12/2024 11:50 PM EST RP Workstation: [id number]   CT Head Wo Contrast Result Date: 02/12/2024 EXAM: CT HEAD WITHOUT CONTRAST 02/12/2024 11:45:43 PM TECHNIQUE: CT of the head was performed without the administration of intravenous contrast. Automated exposure control, iterative reconstruction, and/or weight based adjustment of the mA/kV was utilized to reduce the radiation dose to as low as reasonably achievable. COMPARISON: 01/13/2024 CLINICAL HISTORY: Head trauma, moderate-severe. FINDINGS: BRAIN AND VENTRICLES: No acute hemorrhage. No evidence of acute infarct. No hydrocephalus. No extra-axial collection. No mass effect or midline shift. Mild parenchymal volume loss. Periventricular and deep white matter hypodensity, nonspecific, possibly reflecting chronic small vessel ischemic changes. ORBITS: No acute abnormality. Bilateral lens implants in place. SINUSES: Chronic right  sinusitis. SOFT TISSUES AND SKULL: No acute soft tissue abnormality. No skull fracture. Bilateral cavernous ICA calcification. IMPRESSION: 1. No acute intracranial abnormality. Electronically signed by: Franky Stanford MD MD 02/12/2024 11:48 PM EST RP Workstation: [id number]   DG Elbow Complete Left Result Date: 02/12/2024 EXAM: 3 VIEW(S) XRAY OF THE LEFT ELBOW COMPARISON: None available. CLINICAL HISTORY: sp fall with left shoulder pain FINDINGS: BONES AND JOINTS: No acute fracture. No malalignment. Mild spurring of the olecranon. Degenerative joint space narrowing of the elbow joint. SOFT TISSUES: Unremarkable. IMPRESSION: 1. No acute fracture or dislocation of the elbow. 2. Chronic mild olecranon spurring and degenerative joint space narrowing of the elbow joint. Electronically signed by: Dorethia Molt MD MD 02/12/2024 11:11 PM EST RP Workstation: [id number]   DG Shoulder Left Result Date: 02/12/2024 EXAM: 1 VIEW(S) XRAY OF THE LEFT SHOULDER 02/12/2024 11:07:32 PM COMPARISON: None available. CLINICAL HISTORY: sp fall with left shoulder pain FINDINGS: BONES AND JOINTS: Glenohumeral  joint is normally aligned. No acute fracture. No malalignment. Mild degenerative changes of the acromioclavicular joint. SOFT TISSUES: No abnormal calcifications. Visualized lung is unremarkable. IMPRESSION: 1. No acute fracture or dislocation. 2. Chronic mild degenerative changes of the acromioclavicular joint. Electronically signed by: Dorethia Molt MD MD 02/12/2024 11:10 PM EST RP Workstation: [id number]    Procedures   Medications Ordered in the ED - No data to display  Medical Decision Making Amount and/or Complexity of Data Reviewed Labs: ordered. Radiology: ordered.   89 year old female presents for evaluation.  On exam, HD stable.  Lung sounds are clear bilaterally, no hypoxia.  Abdomen soft and compressible.  Neurological examination at baseline.  Overall nontoxic.  Patient does report left-sided shoulder pain and  elbow pain with full ROM.  Also complaining of neck pain, headache and back pain throughout.  Will have basic labs to assess for electrolyte derangement, leukocytosis and anemia.  Will collect CK to ensure no rhabdomyolysis as patient reportedly was on the floor for some time, unwitnessed fall.  Will collect CT cervical, CT thoracic, CT lumbar and CT head.  Will collect left shoulder x-ray, left elbow x-ray.  CBC is without leukocytosis or anemia.  Metabolic panel is grossly unremarkable without electrolyte derangement, anion gap is 11, no elevated creatinine.  CK 55, no rhabdomyolysis.  CT head unremarkable.  CT cervical spine unremarkable.  CT thoracic spine unremarkable.  CT lumbar spine does not show acute process however does show new mixed sclerotic appearance of the L3 vertebral body with sclerosis below the superior endplate, likely a subacute to chronic compression fracture.  No acute fracture.  Extremity of left shoulder, left elbow unremarkable for acute process.  At this time, patient workup unremarkable.  Patient daughter at bedside advised of findings.  Patient daughter advised to have patient seen by facility PCP on arrival back in facility.  Patient will be discharged back to facility with safe transport, [facility], at this time.  Stable to discharge.   Final diagnoses:  Fall, initial encounter    ED Discharge Orders     None          Ruthell Lonni FALCON, [place] 02/13/24 [id number]  "

## 2024-02-12 NOTE — ED Triage Notes (Signed)
 Pt BIB [facility] from [facility] after an unwitnessed fall. Pt was found about 5 mins after falling per facility. Pt c/o headache and Left sided arm and shoulder pain. Pt A/O x3, with hx of dementia. Pt is not on thinners and denies any LOC.   EMS Vitals  146/98 86 HR 98% RA 201 CBG

## 2024-02-12 NOTE — ED Notes (Signed)
"  Patient in CT at this time.   "

## 2024-02-13 NOTE — Discharge Instructions (Addendum)
 As discussed, all imaging tonight was negative for any acute fracture.  Please follow-up outpatient with PCP.  Return to the ED with new symptoms.

## 2024-03-22 ENCOUNTER — Ambulatory Visit: Admitting: Podiatry

## 2024-03-30 ENCOUNTER — Ambulatory Visit: Admitting: Podiatry
